# Patient Record
Sex: Female | Born: 1980 | Race: Black or African American | Hispanic: No | Marital: Married | Smoking: Never smoker
Health system: Southern US, Community
[De-identification: ages and names within clinical notes are randomized; demographics above are authoritative.]

## PROBLEM LIST (undated history)

## (undated) DIAGNOSIS — K219 Gastro-esophageal reflux disease without esophagitis: Secondary | ICD-10-CM

## (undated) DIAGNOSIS — D649 Anemia, unspecified: Secondary | ICD-10-CM

## (undated) DIAGNOSIS — T7840XA Allergy, unspecified, initial encounter: Secondary | ICD-10-CM

## (undated) DIAGNOSIS — K589 Irritable bowel syndrome without diarrhea: Secondary | ICD-10-CM

## (undated) DIAGNOSIS — F32A Depression, unspecified: Secondary | ICD-10-CM

## (undated) DIAGNOSIS — F329 Major depressive disorder, single episode, unspecified: Secondary | ICD-10-CM

## (undated) DIAGNOSIS — N809 Endometriosis, unspecified: Secondary | ICD-10-CM

## (undated) DIAGNOSIS — F988 Other specified behavioral and emotional disorders with onset usually occurring in childhood and adolescence: Secondary | ICD-10-CM

## (undated) HISTORY — DX: Endometriosis, unspecified: N80.9

## (undated) HISTORY — PX: CHOLECYSTECTOMY: SHX55

## (undated) HISTORY — DX: Major depressive disorder, single episode, unspecified: F32.9

## (undated) HISTORY — DX: Gastro-esophageal reflux disease without esophagitis: K21.9

## (undated) HISTORY — DX: Anemia, unspecified: D64.9

## (undated) HISTORY — PX: TUBAL LIGATION: SHX77

## (undated) HISTORY — DX: Depression, unspecified: F32.A

## (undated) HISTORY — PX: OTHER SURGICAL HISTORY: SHX169

## (undated) HISTORY — DX: Other specified behavioral and emotional disorders with onset usually occurring in childhood and adolescence: F98.8

## (undated) HISTORY — DX: Allergy, unspecified, initial encounter: T78.40XA

## (undated) HISTORY — DX: Irritable bowel syndrome, unspecified: K58.9

---
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
## 2002-02-23 HISTORY — PX: CHOLECYSTECTOMY: SHX55

## 2006-02-23 HISTORY — PX: TUBAL LIGATION: SHX77

## 2009-05-23 ENCOUNTER — Inpatient Hospital Stay (HOSPITAL_COMMUNITY): Admission: AD | Admit: 2009-05-23 | Discharge: 2009-05-23 | Payer: Self-pay | Admitting: Obstetrics & Gynecology

## 2011-09-08 ENCOUNTER — Other Ambulatory Visit: Payer: Self-pay | Admitting: Obstetrics

## 2011-09-08 DIAGNOSIS — N6320 Unspecified lump in the left breast, unspecified quadrant: Secondary | ICD-10-CM

## 2011-09-08 DIAGNOSIS — N644 Mastodynia: Secondary | ICD-10-CM

## 2011-09-14 ENCOUNTER — Ambulatory Visit
Admission: RE | Admit: 2011-09-14 | Discharge: 2011-09-14 | Disposition: A | Discharge status: Home / Self Care | Payer: BC Managed Care – PPO | Source: Ambulatory Visit | Attending: Obstetrics | Admitting: Obstetrics

## 2011-09-14 DIAGNOSIS — N644 Mastodynia: Secondary | ICD-10-CM

## 2011-09-14 DIAGNOSIS — N6320 Unspecified lump in the left breast, unspecified quadrant: Secondary | ICD-10-CM

## 2012-06-05 ENCOUNTER — Ambulatory Visit (INDEPENDENT_AMBULATORY_CARE_PROVIDER_SITE_OTHER): Payer: BC Managed Care – PPO | Admitting: Family Medicine

## 2012-06-05 VITALS — BP 125/87 | HR 105 | Temp 99.0°F | Resp 16 | Ht 59.0 in | Wt 186.0 lb

## 2012-06-05 DIAGNOSIS — J029 Acute pharyngitis, unspecified: Secondary | ICD-10-CM

## 2012-06-05 DIAGNOSIS — J039 Acute tonsillitis, unspecified: Secondary | ICD-10-CM

## 2012-06-05 DIAGNOSIS — R509 Fever, unspecified: Secondary | ICD-10-CM

## 2012-06-05 LAB — POCT CBC
Granulocyte percent: 66.5 %G (ref 37–80)
Lymph, poc: 2.1 (ref 0.6–3.4)
MCH, POC: 27.9 pg (ref 27–31.2)
MCHC: 31.3 g/dL — AB (ref 31.8–35.4)
MID (cbc): 0.9 (ref 0–0.9)
POC Granulocyte: 5.9 (ref 2–6.9)
POC LYMPH PERCENT: 23.3 %L (ref 10–50)
POC MID %: 10.2 %M (ref 0–12)
RDW, POC: 13.2 %

## 2012-06-05 MED ORDER — IBUPROFEN 800 MG PO TABS: 800.0000 mg | ORAL_TABLET | Freq: Three times a day (TID) | ORAL | Status: DC | PRN

## 2012-06-05 MED ORDER — CEFDINIR 300 MG PO CAPS: 600.0000 mg | ORAL_CAPSULE | Freq: Every day | ORAL | Status: DC

## 2012-06-05 NOTE — Progress Notes (Signed)
[address]   [place], [place]  [address]   [phone]  Subjective:    Patient ID: Aimee King, female    DOB: 1980-08-01, 32 y.o.   MRN: [id number]  HPI This 32 y.o. female presents for evaluation of sore throat, laryngitis. Onset five days ago.  Has been taking Dayquil, Nyquil, Ibuprofen 600mg  daily.  No fever; +chills/sweats.  +HA mild.  +ST severe; talking hurts; swallowing hurts; R sided pain mostly.  Pain shooting into ear.  No rhinorrhea; no nasal congestion; +PND; no cough.  +nausea; no vomiting/diarrhea. Decreased appetite.  No rash.  Teaches school.    PCP:  [facility].   Review of Systems  Constitutional: Positive for fever, chills and fatigue.  HENT: Positive for ear pain, sore throat, trouble swallowing, voice change and postnasal drip. Negative for congestion, rhinorrhea, drooling and sinus pressure.   Respiratory: Negative for cough, shortness of breath, wheezing and stridor.   Gastrointestinal: Negative for nausea, vomiting and diarrhea.  Skin: Negative for rash.  Neurological: Positive for headaches. Negative for dizziness and light-headedness.        Past Medical History  Diagnosis Date  . Allergy   . Depression   . Anemia   . Attention deficit disorder (ADD)     Past Surgical History  Procedure Laterality Date  . Cholecystectomy    . Cesarean section    . Tubal ligation      Prior to Admission medications   Medication Sig Start Date End Date Taking? Authorizing Provider  amphetamine-dextroamphetamine (ADDERALL) 10 MG tablet Take 10 mg by mouth 2 (two) times daily.   Yes Historical Provider, MD    Allergies  Allergen Reactions  . Codeine Nausea And Vomiting  . Latex Itching    History   Social History  . Marital Status: Married    Spouse Name: N/A    Number of Children: N/A  . Years of Education: N/A   Occupational History  . Not on file.   Social History Main Topics  . Smoking status: Never Smoker   .  Smokeless tobacco: Not on file  . Alcohol Use: Yes  . Drug Use: No  . Sexually Active: Not on file   Other Topics Concern  . Not on file   Social History Narrative  . No narrative on file    Family History  Problem Relation Age of Onset  . Diabetes Mother   . Heart disease Father   . Diabetes Father   . Diabetes Maternal Grandmother     Objective:   Physical Exam  Nursing note and vitals reviewed. Constitutional: She is oriented to person, place, and time. She appears well-developed and well-nourished. No distress.  HENT:  Head: Normocephalic and atraumatic.  Right Ear: External ear normal.  Left Ear: External ear normal.  Mouth/Throat: Mucous membranes are normal. Oropharyngeal exudate, posterior oropharyngeal edema and posterior oropharyngeal erythema present. No tonsillar abscesses.  Eyes: Conjunctivae are normal. Pupils are equal, round, and reactive to light.  Neck: Normal range of motion. Neck supple. No thyromegaly present.  R anterior cervical LAD; +TTP.  Cardiovascular: Normal rate, regular rhythm and normal heart sounds.  Exam reveals no gallop and no friction rub.   No murmur heard. Pulmonary/Chest: Effort normal and breath sounds normal. No respiratory distress. She has no wheezes. She has no rales.  Lymphadenopathy:    She has cervical adenopathy.  Neurological: She is alert and oriented to person, place, and time.  Skin: No rash noted.  She is not diaphoretic.  Psychiatric: She has a normal mood and affect. Her behavior is normal.    Results for orders placed in visit on 06/05/12  POCT RAPID STREP A (OFFICE)      Result Value Range   Rapid Strep A Screen Negative  Negative  POCT CBC      Result Value Range   WBC 8.9  4.6 - 10.2 K/uL   Lymph, poc 2.1  0.6 - 3.4   POC LYMPH PERCENT 23.3  10 - 50 %L   MID (cbc) 0.9  0 - 0.9   POC MID % 10.2  0 - 12 %M   POC Granulocyte 5.9  2 - 6.9   Granulocyte percent 66.5  37 - 80 %G   RBC 4.73  4.04 - 5.48 M/uL    Hemoglobin 13.2  12.2 - 16.2 g/dL   HCT, POC 16.1  09.6 - 47.9 %   MCV 89.3  80 - 97 fL   MCH, POC 27.9  27 - 31.2 pg   MCHC 31.3 (*) 31.8 - 35.4 g/dL   RDW, POC 04.5     Platelet Count, POC 248  142 - 424 K/uL   MPV 9.2  0 - 99.8 fL         Assessment & Plan:  Sore throat - Plan: POCT rapid strep A, POCT CBC, Epstein-Barr virus VCA antibody panel, Culture, Group A Strep  Fever - Plan: POCT rapid strep A, POCT CBC, Epstein-Barr virus VCA antibody panel, Culture, Group A Strep  Tonsillitis - Plan: cefdinir (OMNICEF) 300 MG capsule    1. Tonsillitis:  New.  Concerning for early tonsillar abscess; thus, will empirically treat with Omnicef.  RTC inability to swallow, drooling, spitting.  Rx for Advil 800mg  tid PRN.  Obtain EBV titers yet history of previous acute mono.    Meds ordered this encounter  Medications  . amphetamine-dextroamphetamine (ADDERALL) 10 MG tablet    Sig: Take 10 mg by mouth 2 (two) times daily.  . cefdinir (OMNICEF) 300 MG capsule    Sig: Take 2 capsules (600 mg total) by mouth daily.    Dispense:  20 capsule    Refill:  0  . ibuprofen (ADVIL,MOTRIN) 800 MG tablet    Sig: Take 1 tablet (800 mg total) by mouth every 8 (eight) hours as needed for pain.    Dispense:  45 tablet    Refill:  0

## 2012-06-05 NOTE — Patient Instructions (Addendum)
Sore throat - Plan: POCT rapid strep A, POCT CBC, Epstein-Barr virus VCA antibody panel, Culture, Group A Strep  Fever - Plan: POCT rapid strep A, POCT CBC, Epstein-Barr virus VCA antibody panel, Culture, Group A Strep  Tonsillitis - Plan: cefdinir (OMNICEF) 300 MG capsule

## 2012-06-06 LAB — EPSTEIN-BARR VIRUS VCA ANTIBODY PANEL
EBV EA IgG: 7.1 U/mL (ref <9.0)
EBV NA IgG: 408 U/mL — ABNORMAL HIGH (ref <18.0)
EBV VCA IgG: 102 U/mL — ABNORMAL HIGH (ref <18.0)
EBV VCA IgM: <10 U/mL (ref <36.0)

## 2012-06-07 LAB — CULTURE, GROUP A STREP

## 2012-06-09 ENCOUNTER — Telehealth: Payer: Self-pay | Admitting: Radiology

## 2012-06-09 NOTE — Telephone Encounter (Signed)
Spoke to patient she is better, she is advised to complete all her antibiotics, follow up if not well.

## 2012-06-09 NOTE — Telephone Encounter (Signed)
Error, patient name changed, she remembered new name when I spoke to her.

## 2012-06-21 ENCOUNTER — Ambulatory Visit (INDEPENDENT_AMBULATORY_CARE_PROVIDER_SITE_OTHER): Payer: BC Managed Care – PPO | Admitting: Family Medicine

## 2012-06-21 VITALS — BP 106/73 | HR 114 | Temp 98.7°F | Resp 16 | Ht 59.75 in | Wt 189.0 lb

## 2012-06-21 DIAGNOSIS — R5383 Other fatigue: Secondary | ICD-10-CM

## 2012-06-21 DIAGNOSIS — J039 Acute tonsillitis, unspecified: Secondary | ICD-10-CM

## 2012-06-21 DIAGNOSIS — R1031 Right lower quadrant pain: Secondary | ICD-10-CM

## 2012-06-21 DIAGNOSIS — R5381 Other malaise: Secondary | ICD-10-CM

## 2012-06-21 LAB — POCT UA - MICROSCOPIC ONLY
Casts, Ur, LPF, POC: NEGATIVE
Crystals, Ur, HPF, POC: NEGATIVE
RBC, urine, microscopic: NEGATIVE
Yeast, UA: NEGATIVE

## 2012-06-21 LAB — POCT URINALYSIS DIPSTICK
Bilirubin, UA: NEGATIVE
Blood, UA: NEGATIVE
Glucose, UA: NEGATIVE
Ketones, UA: NEGATIVE
Leukocytes, UA: NEGATIVE
Nitrite, UA: NEGATIVE
Protein, UA: NEGATIVE
Spec Grav, UA: 1.025
Urobilinogen, UA: 1
pH, UA: 7

## 2012-06-21 LAB — POCT CBC
Granulocyte percent: 73.4 % (ref 37–80)
HCT, POC: 44.1 % (ref 37.7–47.9)
Hemoglobin: 13.6 g/dL (ref 12.2–16.2)
Lymph, poc: 2.3 (ref 0.6–3.4)
MCH, POC: 27.9 pg (ref 27–31.2)
MCHC: 30.8 g/dL — AB (ref 31.8–35.4)
MCV: 90.3 fL (ref 80–97)
MID (cbc): 0.7 (ref 0–0.9)
MPV: 10.1 fL (ref 0–99.8)
POC Granulocyte: 8.5 — AB (ref 2–6.9)
POC LYMPH PERCENT: 20.2 % (ref 10–50)
POC MID %: 6.4 % (ref 0–12)
Platelet Count, POC: 274 K/uL (ref 142–424)
RBC: 4.88 M/uL (ref 4.04–5.48)
RDW, POC: 13.7 %
WBC: 11.6 K/uL — AB (ref 4.6–10.2)

## 2012-06-21 MED ORDER — METHYLPREDNISOLONE 4 MG PO KIT: PACK | ORAL | Status: DC

## 2012-06-21 MED ORDER — AMOXICILLIN-POT CLAVULANATE 875-125 MG PO TABS: 1.0000 | ORAL_TABLET | Freq: Two times a day (BID) | ORAL | Status: DC

## 2012-06-21 NOTE — Progress Notes (Signed)
[facility]:  Office Visit  Chief Complaint:  Chief Complaint  Patient presents with  . Follow-up    HPI: Aimee King is a 32 y.o. female who complains of: 1. Fatigue x several months. She is a Runner, broadcasting/film/video. She feels the overwhelming fatigue is slowing her down. She denies any family h/o thyroid disease. No dry skin, memory loss, depression, + weight gain 2. Throat pain, fatigue, chills, cough x 3 weeks. No better since on Omnicef. Rapid strep, Group B strep, EBV IGM were all negative from last visit 06/05/2012 3. RLQ abd pain x 7 days, sharp with movement, no nausea/vomiting, + chills. No fevers. NO urinary sxs, vaginal dc, Not associated with food. + abd surgery. + h.o endometriosis and is on Mirena IUD for that even though she has had BTL. Deneis ovarian cyst, fibroids, constipation, STDs  Past Medical History  Diagnosis Date  . Allergy   . Depression   . Anemia   . Attention deficit disorder (ADD)   . Endometriosis    Past Surgical History  Procedure Laterality Date  . Cholecystectomy    . Cesarean section    . Tubal ligation     History   Social History  . Marital Status: Married    Spouse Name: N/A    Number of Children: N/A  . Years of Education: N/A   Social History Main Topics  . Smoking status: Never Smoker   . Smokeless tobacco: None  . Alcohol Use: Yes  . Drug Use: No  . Sexually Active: None   Other Topics Concern  . None   Social History Narrative  . None   Family History  Problem Relation Age of Onset  . Diabetes Mother   . Heart disease Father   . Diabetes Father   . Diabetes Maternal Grandmother    Allergies  Allergen Reactions  . Codeine Nausea And Vomiting  . Latex Itching   Prior to Admission medications   Medication Sig Start Date End Date Taking? Authorizing Provider  amphetamine-dextroamphetamine (ADDERALL) 10 MG tablet Take 10 mg by mouth 2 (two) times daily.   Yes Historical Provider, MD  ibuprofen  (ADVIL,MOTRIN) 800 MG tablet Take 1 tablet (800 mg total) by mouth every 8 (eight) hours as needed for pain. 06/05/12  Yes Ethelda Chick, MD     ROS: The patient denies fevers, night sweats, unintentional weight loss, chest pain, palpitations, wheezing, dyspnea on exertion, nausea, vomiting, dysuria, hematuria, melena, numbness, weakness, or tingling.   All other systems have been reviewed and were otherwise negative with the exception of those mentioned in the HPI and as above.    PHYSICAL EXAM: Filed Vitals:   06/21/12 1844  BP: 106/73  Pulse: 114  Temp: 98.7 F (37.1 C)  Resp: 16   Filed Vitals:   06/21/12 1844  Height: 4' 11.75" (1.518 m)  Weight: 189 lb (85.73 kg)   Body mass index is 37.2 kg/(m^2).  General: Alert, no acute distress, obese AA female HEENT:  Normocephalic, atraumatic, oropharynx patent. + erythematous onsil rihgt greater than left, no exudates. + sinus tenderenss, TM nl.  Cardiovascular:  Regular rate and rhythm, no rubs murmurs or gallops.  No Carotid bruits, radial pulse intact. No pedal edema.  Respiratory: Clear to auscultation bilaterally.  No wheezes, rales, or rhonchi.  No cyanosis, no use of accessory musculature GI: No organomegaly, abdomen is soft and minimally-tender RLQ, positive bowel sounds.  No masses. No guarding/rebound Skin: No rashes. Neurologic: Facial  musculature symmetric. Psychiatric: Patient is appropriate throughout our interaction. Lymphatic: No cervical lymphadenopathy Musculoskeletal: Gait intact.   LABS: Results for orders placed in visit on 06/21/12  POCT CBC      Result Value Range   WBC 11.6 (*) 4.6 - 10.2 K/uL   Lymph, poc 2.3  0.6 - 3.4   POC LYMPH PERCENT 20.2  10 - 50 %L   MID (cbc) 0.7  0 - 0.9   POC MID % 6.4  0 - 12 %M   POC Granulocyte 8.5 (*) 2 - 6.9   Granulocyte percent 73.4  37 - 80 %G   RBC 4.88  4.04 - 5.48 M/uL   Hemoglobin 13.6  12.2 - 16.2 g/dL   HCT, POC 47.8  29.5 - 47.9 %   MCV 90.3  80 - 97 fL    MCH, POC 27.9  27 - 31.2 pg   MCHC 30.8 (*) 31.8 - 35.4 g/dL   RDW, POC 62.1     Platelet Count, POC 274  142 - 424 K/uL   MPV 10.1  0 - 99.8 fL  POCT URINALYSIS DIPSTICK      Result Value Range   Color, UA yellow     Clarity, UA cloudy     Glucose, UA neg     Bilirubin, UA neg     Ketones, UA neg     Spec Grav, UA 1.025     Blood, UA neg     pH, UA 7.0     Protein, UA neg     Urobilinogen, UA 1.0     Nitrite, UA neg     Leukocytes, UA Negative    POCT UA - MICROSCOPIC ONLY      Result Value Range   WBC, Ur, HPF, POC 0-2     RBC, urine, microscopic neg     Bacteria, U Microscopic trace     Mucus, UA trace     Epithelial cells, urine per micros 4-16     Crystals, Ur, HPF, POC neg     Casts, Ur, LPF, POC neg     Yeast, UA neg       EKG/XRAY:   Primary read interpreted by Dr. Conley Rolls at [facility].   ASSESSMENT/PLAN: Encounter Diagnoses  Name Primary?  . Other malaise and fatigue Yes  . Acute tonsillitis   . RLQ abdominal pain    I advise patient that her leukocytois may be from her URI and tonsil issues and not from her appendix However she needs to monitor for worsening abd pain sxs, if worsen then need to go to Er or call us so we can get stat CT abd/pelvis Will treat for acute pahyrngitis/tonsillitis with Augmentinnow that we know EBV  IgM is negative Rx amoxacillin Rx prednisone CMP , TSH pending F/u prn     Keelynn Furgerson PHUONG, DO 06/21/2012 8:54 PM

## 2012-06-22 LAB — COMPREHENSIVE METABOLIC PANEL
ALT: 15 U/L (ref 0–35)
BUN: 7 mg/dL (ref 6–23)
Chloride: 101 mEq/L (ref 96–112)
Potassium: 3.9 mEq/L (ref 3.5–5.3)
Total Bilirubin: 0.4 mg/dL (ref 0.3–1.2)

## 2012-06-22 LAB — COMPREHENSIVE METABOLIC PANEL WITH GFR
AST: 13 U/L (ref 0–37)
Albumin: 4.3 g/dL (ref 3.5–5.2)
Alkaline Phosphatase: 64 U/L (ref 39–117)
CO2: 29 meq/L (ref 19–32)
Calcium: 9.3 mg/dL (ref 8.4–10.5)
Creat: 0.76 mg/dL (ref 0.50–1.10)
Glucose, Bld: 74 mg/dL (ref 70–99)
Sodium: 138 meq/L (ref 135–145)
Total Protein: 7.8 g/dL (ref 6.0–8.3)

## 2012-06-22 LAB — TSH: TSH: 1.224 u[IU]/mL (ref 0.350–4.500)

## 2012-10-03 ENCOUNTER — Ambulatory Visit: Payer: BC Managed Care – PPO | Admitting: Obstetrics & Gynecology

## 2012-10-04 ENCOUNTER — Encounter: Payer: Self-pay | Admitting: Advanced Practice Midwife

## 2012-10-04 ENCOUNTER — Ambulatory Visit (INDEPENDENT_AMBULATORY_CARE_PROVIDER_SITE_OTHER): Payer: BC Managed Care – PPO | Admitting: Advanced Practice Midwife

## 2012-10-04 VITALS — BP 110/76 | HR 82 | Temp 98.7°F | Ht 59.75 in | Wt 190.0 lb

## 2012-10-04 DIAGNOSIS — B373 Candidiasis of vulva and vagina: Secondary | ICD-10-CM | POA: Insufficient documentation

## 2012-10-04 DIAGNOSIS — M79672 Pain in left foot: Secondary | ICD-10-CM

## 2012-10-04 DIAGNOSIS — K3189 Other diseases of stomach and duodenum: Secondary | ICD-10-CM

## 2012-10-04 DIAGNOSIS — M79609 Pain in unspecified limb: Secondary | ICD-10-CM

## 2012-10-04 DIAGNOSIS — Z01419 Encounter for gynecological examination (general) (routine) without abnormal findings: Secondary | ICD-10-CM

## 2012-10-04 DIAGNOSIS — R1013 Epigastric pain: Secondary | ICD-10-CM

## 2012-10-04 DIAGNOSIS — R109 Unspecified abdominal pain: Secondary | ICD-10-CM

## 2012-10-04 DIAGNOSIS — N809 Endometriosis, unspecified: Secondary | ICD-10-CM

## 2012-10-04 DIAGNOSIS — M549 Dorsalgia, unspecified: Secondary | ICD-10-CM

## 2012-10-04 DIAGNOSIS — Z0000 Encounter for general adult medical examination without abnormal findings: Secondary | ICD-10-CM

## 2012-10-04 MED ORDER — FLUCONAZOLE 150 MG PO TABS: 150.0000 mg | ORAL_TABLET | Freq: Once | ORAL | Status: DC

## 2012-10-04 NOTE — Progress Notes (Signed)
Subjective:     Aimee King is a 32 y.o. female here for a routine exam.  Current complaints: an annual exam. Pt states she is having random numbness in her right leg.  Personal health questionnaire reviewed: yes.   Gynecologic History No LMP recorded. Patient is not currently having periods (Reason: IUD). Contraception: IUD Last Pap: 2011. Results were: normal Last mammogram: 2013. Results were: normal  Obstetric History OB History   Grav Para Term Preterm Abortions TAB SAB Ect Mult Living                   The following portions of the patient's history were reviewed and updated as appropriate: allergies, current medications, past family history, past medical history, past social history, past surgical history and problem list.   Review of Systems A comprehensive review of systems was negative except for: Gastrointestinal: positive for abdominal pain, change in bowel habits, nausea and patient reports stomach discomfort and pain 30 minutes following her meals, breakfast being the worse. Reports it doesn't seem to matter what she eats. Following eating she often needs to go to the bathroom. She has tried antacids, denies constipation, denies reflux or heartburn. States her stomach is churning and uncomfortable.    Patient reports excessive weight gain due to hormone therapy in the past for her endometriosis. This is currently controlled with her IUD. However she is unable to lose the weight despite her efforts. She has significant foot pain that makes it difficult for her to walk long distances and to exercise. She states within 15 minutes of walking her feet are cramping up with significant pain.       Objective:    BP 110/76  Pulse 82  Temp(Src) 98.7 F (37.1 C)  Ht 4' 11.75" (1.518 m)  Wt 190 lb (86.183 kg)  BMI 37.4 kg/m2  General Appearance:    Alert, cooperative, no distress, appears stated age  Head:    Normocephalic, without obvious abnormality, atraumatic   Eyes:    PERRL, conjunctiva/corneas clear, EOM's intact, fundi    benign, both eyes  Ears:    Normal TM's and external ear canals, both ears  Nose:   Nares normal, septum midline, mucosa normal, no drainage    or sinus tenderness  Throat:   Lips, mucosa, and tongue normal; teeth and gums normal  Neck:   Supple, symmetrical, trachea midline, no adenopathy;    thyroid:  no enlargement/tenderness/nodules; no carotid   bruit or JVD  Back:     Symmetric, no curvature, ROM normal, no CVA tenderness  Lungs:     Clear to auscultation bilaterally, respirations unlabored  Chest Wall:    No tenderness or deformity   Heart:    Regular rate and rhythm, S1 and S2 normal, no murmur, rub   or gallop  Breast Exam:    No tenderness, masses, or nipple abnormality  Abdomen:     Soft, non-tender, bowel sounds active all four quadrants,    no masses, no organomegaly  Genitalia:    Normal female without lesion, discharge or tenderness  Rectal:    Normal tone, normal prostate, no masses or tenderness;   guaiac negative stool  Extremities:   Extremities normal, atraumatic, no cyanosis or edema  Pulses:   2+ and symmetric all extremities  Skin:   Skin color, texture, turgor normal, no rashes or lesions  Lymph nodes:   Cervical, supraclavicular, and axillary nodes normal  Neurologic:   CNII-XII intact, normal strength, sensation and  reflexes    throughout       Assessment:   Endometriosis symptoms controlled with IUD  Bilateral foot pain, affecting ambulation and exercise Back pain GI discomfort following eating, cannot rule out IBS Overweight Family history of DM  Plan:    Education reviewed: calcium supplements, depression evaluation, low fat, low cholesterol diet, safe sex/STD prevention, self breast exams and weight bearing exercise. Contraception: IUD and tubal ligation. Hormone replacement therapy: not applicable. Follow up in: PRN .Marland Kitchen   We discussed in detail her plan for weight loss and  prevention of DM. Referral for GI and Podiatry.  Annual labs today for health prevention and screening.   50 min spent with patient greater than 80% spent in counseling and coordination of care.   Anab Vivar Wilson Singer CNM

## 2012-10-05 ENCOUNTER — Other Ambulatory Visit: Payer: BC Managed Care – PPO

## 2012-10-05 DIAGNOSIS — Z0000 Encounter for general adult medical examination without abnormal findings: Secondary | ICD-10-CM

## 2012-10-05 LAB — COMPREHENSIVE METABOLIC PANEL
Albumin: 3.9 g/dL (ref 3.5–5.2)
CO2: 28 mEq/L (ref 19–32)
Calcium: 9.1 mg/dL (ref 8.4–10.5)
Chloride: 103 mEq/L (ref 96–112)
Total Bilirubin: 0.4 mg/dL (ref 0.3–1.2)
Total Protein: 7.3 g/dL (ref 6.0–8.3)

## 2012-10-05 LAB — LIPID PANEL
Cholesterol: 144 mg/dL (ref 0–200)
HDL: 43 mg/dL (ref >39)
LDL Cholesterol: 85 mg/dL (ref 0–99)
Total CHOL/HDL Ratio: 3.3 Ratio
Triglycerides: 78 mg/dL (ref <150)

## 2012-10-05 LAB — PAP IG AND HPV HIGH-RISK

## 2012-10-05 LAB — CBC WITH DIFFERENTIAL/PLATELET
Basophils Absolute: 0 10*3/uL (ref 0.0–0.1)
Basophils Relative: 1 % (ref 0–1)
Eosinophils Relative: 1 % (ref 0–5)
HCT: 42.2 % (ref 36.0–46.0)
Lymphs Abs: 2.4 10*3/uL (ref 0.7–4.0)
MCHC: 33.2 g/dL (ref 30.0–36.0)
MCV: 86.8 fL (ref 78.0–100.0)
RBC: 4.86 MIL/uL (ref 3.87–5.11)
RDW: 13.9 % (ref 11.5–15.5)

## 2012-10-06 LAB — TSH: TSH: 1.627 u[IU]/mL (ref 0.350–4.500)

## 2012-10-07 DIAGNOSIS — N809 Endometriosis, unspecified: Secondary | ICD-10-CM | POA: Insufficient documentation

## 2012-10-07 DIAGNOSIS — M79671 Pain in right foot: Secondary | ICD-10-CM | POA: Insufficient documentation

## 2012-10-07 DIAGNOSIS — R109 Unspecified abdominal pain: Secondary | ICD-10-CM | POA: Insufficient documentation

## 2012-10-07 DIAGNOSIS — M549 Dorsalgia, unspecified: Secondary | ICD-10-CM | POA: Insufficient documentation

## 2012-10-13 ENCOUNTER — Encounter: Payer: Self-pay | Admitting: Advanced Practice Midwife

## 2012-10-19 ENCOUNTER — Encounter: Payer: Self-pay | Admitting: Gastroenterology

## 2012-10-26 ENCOUNTER — Ambulatory Visit: Payer: BC Managed Care – PPO | Admitting: Gastroenterology

## 2012-12-16 ENCOUNTER — Encounter (HOSPITAL_COMMUNITY): Payer: Self-pay | Admitting: Emergency Medicine

## 2012-12-16 ENCOUNTER — Emergency Department (HOSPITAL_COMMUNITY)
Admission: EM | Admit: 2012-12-16 | Discharge: 2012-12-16 | Disposition: A | Discharge status: Home / Self Care | Payer: BC Managed Care – PPO | Source: Home / Self Care | Attending: Family Medicine | Admitting: Family Medicine

## 2012-12-16 DIAGNOSIS — J029 Acute pharyngitis, unspecified: Secondary | ICD-10-CM

## 2012-12-16 HISTORY — DX: Other specified behavioral and emotional disorders with onset usually occurring in childhood and adolescence: F98.8

## 2012-12-16 MED ORDER — AMOXICILLIN-POT CLAVULANATE 875-125 MG PO TABS: 1.0000 | ORAL_TABLET | Freq: Two times a day (BID) | ORAL | Status: DC

## 2012-12-16 MED ORDER — TRAMADOL HCL 50 MG PO TABS: 50.0000 mg | ORAL_TABLET | Freq: Three times a day (TID) | ORAL | Status: DC | PRN

## 2012-12-16 NOTE — ED Provider Notes (Signed)
CSN: [id number]     Arrival date & time 12/16/12  1727 History   First MD Initiated Contact with Patient 12/16/12 1756     Chief Complaint  Patient presents with  . Sore Throat   (Consider location/radiation/quality/duration/timing/severity/associated sxs/prior Treatment) Patient is a 32 y.o. female presenting with pharyngitis. The history is provided by the patient.  Sore Throat This is a new problem. The current episode started more than 1 week ago. The problem occurs constantly. The problem has not changed since onset.Pertinent negatives include no chest pain, no abdominal pain, no headaches and no shortness of breath. Associated symptoms comments: Ear pain. The symptoms are aggravated by swallowing and eating. Nothing relieves the symptoms. She has tried acetaminophen and water for the symptoms. The treatment provided no relief.   She states she had similar sore throat in the spring of this year that lasted >1 month treated with antibiotics. Neg for strep at that time. Has history of mono. No recent antibiotics. 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   Past Medical History  Diagnosis Date  . ADD (attention deficit disorder)    Past Surgical History  Procedure Laterality Date  . Cesarean section  2004, 2008  . Cholecystectomy  2004  . Laparoscopy  2002    endometriosis  . Tubal ligation  2008   Family History  Problem Relation Age of Onset  . Hypertension Mother   . Diabetes Mother   . Hypertension Father    History  Substance Use Topics  . Smoking status: Never Smoker   . Smokeless tobacco: Not on file  . Alcohol Use: Yes     Comment: occasional   OB History   Grav Para Term Preterm Abortions TAB SAB Ect Mult Living                 Review of Systems  Constitutional: Negative for fever and chills.  HENT: Positive for congestion, ear pain, rhinorrhea, sore throat and trouble swallowing. Negative for mouth sores and sinus pressure.   Eyes: Negative  for visual disturbance.  Respiratory: Positive for cough. Negative for shortness of breath.   Cardiovascular: Negative for chest pain and leg swelling.  Gastrointestinal: Negative for abdominal pain.  Genitourinary: Negative for dysuria.  Musculoskeletal: Negative for arthralgias and myalgias.  Skin: Negative for rash.  Neurological: Negative for headaches.    Allergies  Codeine and Latex  Home Medications   Current Outpatient Rx  Name  Route  Sig  Dispense  Refill  . amphetamine-dextroamphetamine (ADDERALL XR) 20 MG 24 hr capsule   Oral   Take 20 mg by mouth every morning.         Marland Kitchen levonorgestrel (MIRENA) 20 MCG/24HR IUD   Intrauterine   1 each by Intrauterine route once.         Marland Kitchen amoxicillin-clavulanate (AUGMENTIN) 875-125 MG per tablet   Oral   Take 1 tablet by mouth 2 (two) times daily.   14 tablet   0   . traMADol (ULTRAM) 50 MG tablet   Oral   Take 1 tablet (50 mg total) by mouth every 8 (eight) hours as needed for pain.   10 tablet   0    BP 119/74  Pulse 83  Temp(Src) 98.1 F (36.7 C) (Oral)  Resp 16  SpO2 98% Physical Exam  Constitutional: She is oriented to person, place, and time. She appears well-developed and well-nourished. No distress.  HENT:  Head: Normocephalic and atraumatic.  Right  Ear: Tympanic membrane normal.  Left Ear: Tympanic membrane normal.  Nose: Nose normal.  Mouth/Throat: Mucous membranes are normal. Posterior oropharyngeal erythema present. No oropharyngeal exudate, posterior oropharyngeal edema or tonsillar abscesses.  Eyes: Pupils are equal, round, and reactive to light.  Neck: Normal range of motion. Neck supple.  Cardiovascular: Normal rate, regular rhythm and normal heart sounds.   No murmur heard. Pulmonary/Chest: Effort normal and breath sounds normal. She has no wheezes.  Abdominal: Soft. She exhibits no distension. There is no tenderness.  Musculoskeletal: Normal range of motion. She exhibits no edema and no  tenderness.  Neurological: She is alert and oriented to person, place, and time.  Skin: Skin is warm and dry.  Psychiatric: She has a normal mood and affect.    ED Course  Procedures (including critical care time) Labs Review Labs Reviewed  POCT RAPID STREP A ([facility] ONLY)   Imaging Review No results found.  MDM   1. Pharyngitis    Strep negative, patient persistent that antibiotics help. I think allergies may be playing a role in her presentation - Augmentin PO x7 days. Advised patient that longer antibiotic use would not be beneficial in the long run - Should establish PCP if she desires ENT referral - Con't allergy medication. - Use Heat to the left side of neck and ear for comfort - F/u here if she worsens or fails to improve    Hilarie Fredrickson, MD 12/16/12 1923

## 2012-12-16 NOTE — ED Notes (Signed)
C/o sore throat onset 1 week ago. States it started out minor and got progressively worse.  States she has shooting pain into her L ear.  Had runny nose and cough last week.  Cough was prod of greenish yellow sputum last week but is dry now.  No fever.  C/o sinus pressure last week but it is gone now.

## 2012-12-18 LAB — CULTURE, GROUP A STREP

## 2012-12-18 NOTE — ED Provider Notes (Signed)
Medical screening examination/treatment/procedure(s) were performed by a resident physician or non-physician practitioner and as the supervising physician I was immediately available for consultation/collaboration.  Anieya Helman, MD  Shanya Ferriss S Tynleigh Birt, MD 12/18/12 0840 

## 2012-12-27 ENCOUNTER — Ambulatory Visit (INDEPENDENT_AMBULATORY_CARE_PROVIDER_SITE_OTHER): Payer: BC Managed Care – PPO | Admitting: Advanced Practice Midwife

## 2012-12-27 ENCOUNTER — Encounter: Payer: Self-pay | Admitting: Advanced Practice Midwife

## 2012-12-27 VITALS — BP 122/84 | HR 103 | Temp 98.5°F | Wt 176.0 lb

## 2012-12-27 DIAGNOSIS — A499 Bacterial infection, unspecified: Secondary | ICD-10-CM

## 2012-12-27 DIAGNOSIS — B379 Candidiasis, unspecified: Secondary | ICD-10-CM

## 2012-12-27 DIAGNOSIS — N760 Acute vaginitis: Secondary | ICD-10-CM

## 2012-12-27 DIAGNOSIS — Z113 Encounter for screening for infections with a predominantly sexual mode of transmission: Secondary | ICD-10-CM

## 2012-12-27 DIAGNOSIS — B9689 Other specified bacterial agents as the cause of diseases classified elsewhere: Secondary | ICD-10-CM

## 2012-12-27 MED ORDER — METRONIDAZOLE 500 MG PO TABS: 500.0000 mg | ORAL_TABLET | Freq: Two times a day (BID) | ORAL | Status: DC

## 2012-12-27 MED ORDER — FLUCONAZOLE 150 MG PO TABS: 150.0000 mg | ORAL_TABLET | Freq: Once | ORAL | Status: DC

## 2012-12-27 NOTE — Progress Notes (Signed)
Subjective:     Aimee King is a 32 y.o. female here for a routine exam.  Current complaints: STD testing. Pt states she recently found out her husband has cheated.  Personal health questionnaire reviewed: yes.   Gynecologic History No LMP recorded. Patient is not currently having periods (Reason: IUD). Contraception: IUD and tubal ligation   Obstetric History OB History  No data available     The following portions of the patient's history were reviewed and updated as appropriate: allergies, current medications, past family history, past medical history, past social history, past surgical history and problem list.  Review of Systems A comprehensive review of systems was negative.    Objective:    Pelvic: cervix normal in appearance, external genitalia normal, no adnexal masses or tenderness, no cervical motion tenderness, rectovaginal septum normal, uterus normal size, shape, and consistency and vagina normal without discharge    Microscopic wet-mount exam shows clue cells, hyphae, + whiff.   Assessment:   STI exam Bacterial Vaginosis Yeast infection   Plan:    Follow up in: PRN .Marland Kitchen   GC/CT Flagyl PO Diflucan STI screen pending.  20 min spent with patient greater than 80% spent in counseling and coordination of care.   Ethelmae Ringel Wilson Singer CNM

## 2013-01-18 ENCOUNTER — Telehealth: Payer: Self-pay | Admitting: *Deleted

## 2013-02-01 ENCOUNTER — Telehealth: Payer: Self-pay | Admitting: *Deleted

## 2013-03-24 ENCOUNTER — Ambulatory Visit (INDEPENDENT_AMBULATORY_CARE_PROVIDER_SITE_OTHER): Payer: BC Managed Care – PPO | Admitting: Emergency Medicine

## 2013-03-24 ENCOUNTER — Ambulatory Visit: Payer: BC Managed Care – PPO

## 2013-03-24 VITALS — BP 122/74 | HR 99 | Temp 98.0°F | Resp 17 | Ht 59.5 in | Wt 168.0 lb

## 2013-03-24 DIAGNOSIS — R0781 Pleurodynia: Secondary | ICD-10-CM

## 2013-03-24 DIAGNOSIS — S20219A Contusion of unspecified front wall of thorax, initial encounter: Secondary | ICD-10-CM

## 2013-03-24 DIAGNOSIS — T671XXA Heat syncope, initial encounter: Secondary | ICD-10-CM

## 2013-03-24 MED ORDER — HYDROCODONE-ACETAMINOPHEN 5-325 MG PO TABS: 1.0000 | ORAL_TABLET | ORAL | Status: DC | PRN

## 2013-03-24 NOTE — Progress Notes (Addendum)
[facility] [address] [place] [address] [phone]  Date:  03/24/2013   Name:  Aimee King   DOB:  08/15/1980   MRN:  [id number]  PCP:  No PCP Per Patient    Chief Complaint: Chest Pain   History of Present Illness:  Aimee King is a 33 y.o. very pleasant female patient who presents with the following:  Patient was cleaning the paddles on her ceiling fans last night and fell onto her left side on the floor.  She had immediate left lateral chest wall pain.  No improvement over night.  Has no cough, hemoptysis, hematuria.  No nausea or vomiting.  No stool.  Hungry.  Pain is quite sever when she moves.  No improvement with over the counter medications or other home remedies. Denies other complaint or health concern today.   There are no active problems to display for this patient.   Past Medical History  Diagnosis Date  . ADD (attention deficit disorder)     Past Surgical History  Procedure Laterality Date  . Cesarean section  2004, 2008  . Cholecystectomy  2004  . Laparoscopy  2002    endometriosis  . Tubal ligation  2008    History  Substance Use Topics  . Smoking status: Never Smoker   . Smokeless tobacco: Not on file  . Alcohol Use: Yes     Comment: occasional    Family History  Problem Relation Age of Onset  . Hypertension Mother   . Diabetes Mother   . Hypertension Father     Allergies  Allergen Reactions  . Codeine Nausea And Vomiting  . Latex     Feels like little cuts in her vagina.    Medication list has been reviewed and updated.  Current Outpatient Prescriptions on File Prior to Visit  Medication Sig Dispense Refill  . amphetamine-dextroamphetamine (ADDERALL XR) 20 MG 24 hr capsule Take 20 mg by mouth every morning.      Marland Kitchen levonorgestrel (MIRENA) 20 MCG/24HR IUD 1 each by Intrauterine route once.      Marland Kitchen amoxicillin-clavulanate (AUGMENTIN) 875-125 MG per tablet Take 1 tablet by mouth 2 (two) times  daily.  14 tablet  0  . traMADol (ULTRAM) 50 MG tablet Take 1 tablet (50 mg total) by mouth every 8 (eight) hours as needed for pain.  10 tablet  0   No current facility-administered medications on file prior to visit.    Review of Systems:  As per HPI, otherwise negative.    Physical Examination: Filed Vitals:   03/24/13 1129  BP: 122/74  Pulse: 99  Temp: 98 F (36.7 C)  Resp: 17   Filed Vitals:   03/24/13 1129  Height: 4' 11.5" (1.511 m)  Weight: 168 lb (76.204 kg)   Body mass index is 33.38 kg/(m^2). Ideal Body Weight: Weight in (lb) to have BMI = 25: 125.6  GEN: WDWN, NAD, Non-toxic, A & O x 3 HEENT: Atraumatic, Normocephalic. Neck supple. No masses, No LAD. Ears and Nose: No external deformity. CV: RRR, No M/G/R. No JVD. No thrill. No extra heart sounds. CHEST:  Tender and splints left lateral chest wall.  No ecchymosis, flail, or crepitus. PULM: CTA B, no wheezes, crackles, rhonchi. No retractions. No resp. distress. No accessory muscle use. ABD: S, NT, ND, +BS. No rebound. No HSM. EXTR: No c/c/e NEURO Normal gait.  PSYCH: Normally interactive. Conversant. Not depressed or anxious appearing.  Calm demeanor.    Assessment  and Plan: Chest wall contusion vicodin Follow up in one week reviewed red flags with patient Pillow support for cough Signed,  Ellison Carwin, MD   [facility] reading (PRIMARY) by  Dr. Ouida Sills.  No pneumo/hemo.

## 2013-03-24 NOTE — Patient Instructions (Signed)
Chest Contusion °A chest contusion is a deep bruise on your chest area. Contusions are the result of an injury that caused bleeding under the skin. A chest contusion may involve bruising of the skin, muscles, or ribs. The contusion may turn blue, purple, or yellow. Minor injuries will give you a painless contusion, but more severe contusions may stay painful and swollen for a few weeks. °CAUSES  °A contusion is usually caused by a blow, trauma, or direct force to an area of the body. °SYMPTOMS  °· Swelling and redness of the injured area. °· Discoloration of the injured area. °· Tenderness and soreness of the injured area. °· Pain. °DIAGNOSIS  °The diagnosis can be made by taking a history and performing a physical exam. An X-ray, CT scan, or MRI may be needed to determine if there were any associated injuries, such as broken bones (fractures) or internal injuries. °TREATMENT  °Often, the best treatment for a chest contusion is resting, icing, and applying cold compresses to the injured area. Deep breathing exercises may be recommended to reduce the risk of pneumonia. Over-the-counter medicines may also be recommended for pain control. °HOME CARE INSTRUCTIONS  °· Put ice on the injured area. °· Put ice in a plastic bag. °· Place a towel between your skin and the bag. °· Leave the ice on for 15-20 minutes, 03-04 times a day. °· Only take over-the-counter or prescription medicines as directed by your caregiver. Your caregiver may recommend avoiding anti-inflammatory medicines (aspirin, ibuprofen, and naproxen) for 48 hours because these medicines may increase bruising. °· Rest the injured area. °· Perform deep-breathing exercises as directed by your caregiver. °· Stop smoking if you smoke. °· Do not lift objects over 5 pounds (2.3 kg) for 3 days or longer if recommended by your caregiver. °SEEK IMMEDIATE MEDICAL CARE IF:  °· You have increased bruising or swelling. °· You have pain that is getting worse. °· You have  difficulty breathing. °· You have dizziness, weakness, or fainting. °· You have blood in your urine or stool. °· You cough up or vomit blood. °· Your swelling or pain is not relieved with medicines. °MAKE SURE YOU:  °· Understand these instructions. °· Will watch your condition. °· Will get help right away if you are not doing well or get worse. °Document Released: 11/04/2000 Document Revised: 11/04/2011 Document Reviewed: 08/03/2011 °[employer]® Patient Information ©2014 [employer]. ° °

## 2013-09-29 ENCOUNTER — Ambulatory Visit: Payer: BC Managed Care – PPO | Admitting: Family Medicine

## 2013-11-10 ENCOUNTER — Ambulatory Visit (INDEPENDENT_AMBULATORY_CARE_PROVIDER_SITE_OTHER): Payer: BC Managed Care – PPO | Admitting: Internal Medicine

## 2013-11-10 ENCOUNTER — Ambulatory Visit (INDEPENDENT_AMBULATORY_CARE_PROVIDER_SITE_OTHER): Payer: BC Managed Care – PPO

## 2013-11-10 VITALS — BP 112/64 | HR 91 | Temp 97.9°F | Resp 16 | Ht <= 58 in | Wt 173.0 lb

## 2013-11-10 DIAGNOSIS — G8929 Other chronic pain: Secondary | ICD-10-CM

## 2013-11-10 DIAGNOSIS — R1013 Epigastric pain: Secondary | ICD-10-CM

## 2013-11-10 DIAGNOSIS — R110 Nausea: Secondary | ICD-10-CM

## 2013-11-10 DIAGNOSIS — R6883 Chills (without fever): Secondary | ICD-10-CM

## 2013-11-10 LAB — POCT URINALYSIS DIPSTICK
Bilirubin, UA: NEGATIVE
GLUCOSE UA: NEGATIVE
KETONES UA: NEGATIVE
LEUKOCYTES UA: NEGATIVE
NITRITE UA: NEGATIVE
PH UA: 6
PROTEIN UA: NEGATIVE
SPEC GRAV UA: 1.02
UROBILINOGEN UA: 0.2

## 2013-11-10 LAB — POCT UA - MICROSCOPIC ONLY
BACTERIA, U MICROSCOPIC: NEGATIVE
Casts, Ur, LPF, POC: NEGATIVE
Crystals, Ur, HPF, POC: NEGATIVE
MUCUS UA: NEGATIVE
YEAST UA: NEGATIVE

## 2013-11-10 MED ORDER — ONDANSETRON HCL 4 MG PO TABS: 4.0000 mg | ORAL_TABLET | Freq: Three times a day (TID) | ORAL | Status: DC | PRN

## 2013-11-10 NOTE — Progress Notes (Signed)
Subjective:    Patient ID: Aimee King, female    DOB: 23-Nov-1980, 33 y.o.   MRN: [id number] This chart was scribed for Delta. Laney Pastor, MD by Steva Colder, ED Scribe. The patient was seen in room 2 at 6:55 PM.   Chief Complaint  Patient presents with   Nausea   Dizziness    HPI Aimee King is a 33 y.o. female with a medical hx of ho presents today complaining of nausea onset 4 days. She states that the nausea began really light and now it is progressively worse. She states that she has not ate because of it. She states that she has not had a new diet since the symptoms have came. She states that she will have moments where she will need to use the bathroom quickly. She states that she has not had a Bowel movement in a couple days. She states that she is having associated symptoms of dizziness, chills, and night sweats. She denies dysuria, vomiting, sore throat, congestion, fever, and any other associated symptoms. She denies being pregnant. She states that she has had a tubal ligation and she is on Mirena. She states that she has had the Mirena for four years and she will spot while on it. 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     Patient Active Problem List   Diagnosis Date Noted   Back pain 10/07/2012   Endometriosis 10/07/2012   Foot pain, bilateral 10/07/2012   Stomach pain 10/07/2012   Well woman exam 10/04/2012   Past Medical History  Diagnosis Date   Allergy    Depression    Anemia    Attention deficit disorder (ADD)    Endometriosis    Past Surgical History  Procedure Laterality Date   Cholecystectomy     Cesarean section     Tubal ligation     Allergies  Allergen Reactions   Codeine Nausea And Vomiting   Latex Itching   Prior to Admission medications   Medication Sig Start Date End Date Taking? Authorizing Provider  amphetamine-dextroamphetamine (ADDERALL) 10 MG tablet Take 20 mg by  mouth 2 (two) times daily.    Yes Historical Provider, MD  fexofenadine (ALLEGRA) 30 MG tablet Take 30 mg by mouth daily.    Historical Provider, MD  fluconazole (DIFLUCAN) 150 MG tablet Take 1 tablet (150 mg total) by mouth once. 12/27/12   [name], CNM  metroNIDAZOLE (FLAGYL) 500 MG tablet Take 1 tablet (500 mg total) by mouth 2 (two) times daily. 12/27/12   [name], CNM      Review of Systems  Constitutional: Negative for fever.  HENT: Negative for ear pain.   Eyes: Negative for pain.  Respiratory: Negative for cough and shortness of breath.   Cardiovascular: Negative for chest pain.  Genitourinary: Negative for frequency.  Neurological: Negative for syncope and headaches.       Objective:   Physical Exam  Nursing note and vitals reviewed. Constitutional: She is oriented to person, place, and time. She appears well-developed and well-nourished. No distress.  HENT:  Head: Normocephalic and atraumatic.  Eyes: EOM are normal.  Neck: Neck supple.  Cardiovascular: Normal rate.   Pulmonary/Chest: Effort normal and breath sounds normal. No respiratory distress.  Abdominal:  Tender in the epigastric area without rebound. No other masses noted. Mild tenderness in the RLQ. Negative flank tenderness.   Musculoskeletal: Normal range of motion.  Neurological: She is alert  and oriented to person, place, and time.  Skin: Skin is warm and dry.  Psychiatric: She has a normal mood and affect. Her behavior is normal.   [facility] reading (PRIMARY) by  Dr.[name]= NAD     BP 112/64   Pulse 91   Temp(Src) 97.9 F (36.6 C) (Oral)   Resp 16   Ht 4\' 3"  (1.295 m)   Wt 173 lb (78.472 kg)   BMI 46.79 kg/m2   SpO2 100%  Assessment & Plan:  I personally performed the services described in this documentation, which was scribed in my presence. The recorded information has been reviewed and is accurate.    Nausea alone - Plan: POCT urinalysis dipstick, POCT UA - Microscopic  Only  Abdominal pain, chronic, epigastric - Plan: POCT urinalysis dipstick, POCT UA - Microscopic Only, DG Abd 1 View  Chills - Plan: POCT urinalysis dipstick, POCT UA - Microscopic Only  This should all be secondary to constipation and she will start MiraLax and fiber along with Zofran for her nausea and recheck in 4-5 days if not well

## 2014-02-08 ENCOUNTER — Ambulatory Visit (INDEPENDENT_AMBULATORY_CARE_PROVIDER_SITE_OTHER): Payer: BC Managed Care – PPO | Admitting: Family Medicine

## 2014-02-08 VITALS — BP 118/80 | HR 96 | Temp 98.0°F | Resp 18 | Ht 59.5 in | Wt 178.8 lb

## 2014-02-08 DIAGNOSIS — R05 Cough: Secondary | ICD-10-CM

## 2014-02-08 DIAGNOSIS — J9801 Acute bronchospasm: Secondary | ICD-10-CM

## 2014-02-08 DIAGNOSIS — R059 Cough, unspecified: Secondary | ICD-10-CM

## 2014-02-08 DIAGNOSIS — J0101 Acute recurrent maxillary sinusitis: Secondary | ICD-10-CM

## 2014-02-08 MED ORDER — ALBUTEROL SULFATE (2.5 MG/3ML) 0.083% IN NEBU
2.5000 mg | INHALATION_SOLUTION | Freq: Once | RESPIRATORY_TRACT | Status: AC
  Administered 2014-02-08: 2.5 mg via RESPIRATORY_TRACT

## 2014-02-08 MED ORDER — AMOXICILLIN-POT CLAVULANATE 875-125 MG PO TABS: 1.0000 | ORAL_TABLET | Freq: Two times a day (BID) | ORAL | Status: DC

## 2014-02-08 MED ORDER — BENZONATATE 100 MG PO CAPS: 100.0000 mg | ORAL_CAPSULE | Freq: Three times a day (TID) | ORAL | Status: DC | PRN

## 2014-02-08 MED ORDER — PREDNISONE 20 MG PO TABS: ORAL_TABLET | ORAL | Status: DC

## 2014-02-08 NOTE — Patient Instructions (Signed)

## 2014-02-08 NOTE — Progress Notes (Signed)
Patient ID: Aimee King, female   DOB: 1980/07/22, 33 y.o.   MRN: [id number]    Subjective:    Patient ID: Aimee King, female    DOB: [birth year], 33 y.o.   MRN: [id number]  02/08/2014   This chart was scribed for Aimee Honour, MD by Aimee King, ED Scribe. This patient was seen in room 4 and the patient's care was started at 4:29 PM.   Cough; Nasal Congestion; Facial Pain; and Pelvic Pain   HPI  HPI Comments: Aimee King is a 33 y.o. female who presents to [facility] complaining of cold-like symptoms that began began 1 week ago and worsened today. Last week, patient began to have post-nasal drip, aches and fatigue, chest tightness, an unproductive cough, and nasal congestion, swelling around her right eye that is worse at night and in the morning, as well as sore throat and otalgia that began today. Patient also complains of chronic, usually low-grade pelvic pressure/pain that has worsened today. Coughing and moving exacerbate her pelvic pain. Deep breathing and laughing trigger her cough. Patient has tried Zyrtec D and Tussin DM for the past week with some relief. She denies diaphoresis, chills, rhinorrhea, dysuria, hematuria, urgency, frequency, vaginal discharge or irritation, vaginal bleeding, nausea, vomiting, diarhea, and new constipation. Patient denies having menstrual periods, due to Mirena. She is married. Patient is a Pharmacist, hospital. She denies DM and smoking.  S/p BTL.    Review of Systems  Constitutional: Positive for fatigue. Negative for fever, chills and diaphoresis.  HENT: Positive for congestion, ear pain, facial swelling, postnasal drip, sinus pressure, sore throat, trouble swallowing and voice change. Negative for ear discharge, rhinorrhea and sneezing.   Respiratory: Positive for cough, chest tightness and wheezing. Negative for shortness of breath and stridor.   Gastrointestinal: Negative for nausea, vomiting, abdominal pain and  diarrhea.  Genitourinary: Positive for pelvic pain. Negative for dysuria, urgency, frequency, hematuria, flank pain, vaginal bleeding, vaginal discharge, enuresis, genital sores and menstrual problem.  Skin: Negative for rash.    Past Medical History  Diagnosis Date  . ADD (attention deficit disorder)   . Allergy   . Depression   . Anemia   . Attention deficit disorder (ADD)   . Endometriosis    Past Surgical History  Procedure Laterality Date  . Cesarean section  2004, 2008  . Cholecystectomy  2004  . Laparoscopy  2002    endometriosis  . Tubal ligation  2008  . Cholecystectomy    . Cesarean section    . Tubal ligation     Allergies  Allergen Reactions  . Codeine Nausea And Vomiting  . Codeine Nausea And Vomiting  . Latex Itching  . Latex     Feels like little cuts in her vagina.   Current Outpatient Prescriptions  Medication Sig Dispense Refill  . amphetamine-dextroamphetamine (ADDERALL XR) 20 MG 24 hr capsule Take 20 mg by mouth every morning.    . fexofenadine (ALLEGRA) 30 MG tablet Take 30 mg by mouth daily.    . fluconazole (DIFLUCAN) 150 MG tablet Take 1 tablet (150 mg total) by mouth once. 1 tablet 0  . HYDROcodone-acetaminophen (NORCO) 5-325 MG per tablet Take 1-2 tablets by mouth every 4 (four) hours as needed for moderate pain. 30 tablet 0  . ondansetron (ZOFRAN) 4 MG tablet Take 1 tablet (4 mg total) by mouth every 8 (eight) hours as needed for nausea or vomiting. 20 tablet 0  . traMADol (ULTRAM) 50 MG tablet Take 1  tablet (50 mg total) by mouth every 8 (eight) hours as needed for pain. 10 tablet 0  . amoxicillin-clavulanate (AUGMENTIN) 875-125 MG per tablet Take 1 tablet by mouth 2 (two) times daily. 20 tablet 0  . amphetamine-dextroamphetamine (ADDERALL) 10 MG tablet Take 20 mg by mouth 2 (two) times daily.     . benzonatate (TESSALON) 100 MG capsule Take 1-2 capsules (100-200 mg total) by mouth 3 (three) times daily as needed for cough. 40 capsule 0  .  levonorgestrel (MIRENA) 20 MCG/24HR IUD 1 each by Intrauterine route once.    . metroNIDAZOLE (FLAGYL) 500 MG tablet Take 1 tablet (500 mg total) by mouth 2 (two) times daily. (Patient not taking: Reported on 02/08/2014) 14 tablet 0  . predniSONE (DELTASONE) 20 MG tablet Three tablets daily x 2 days then two tablets daily x 5 days then one tablet daily x 3 days 19 tablet 0   No current facility-administered medications for this visit.       Objective:    BP 118/80 mmHg  Pulse 96  Temp(Src) 98 F (36.7 C) (Oral)  Resp 18  Ht 4' 11.5" (1.511 m)  Wt 178 lb 12.8 oz (81.103 kg)  BMI 35.52 kg/m2  SpO2 99% Physical Exam  Constitutional: She is oriented to person, place, and time. She appears well-developed and well-nourished. No distress.  HENT:  Head: Atraumatic. Microcephalic.  Right Ear: External ear normal.  Left Ear: External ear normal.  Nose: Mucosal edema and rhinorrhea present. Right sinus exhibits maxillary sinus tenderness and frontal sinus tenderness. Left sinus exhibits no maxillary sinus tenderness and no frontal sinus tenderness.  Mouth/Throat: No oropharyngeal exudate.  Minimal right periorbital swelling.  Eyes: Conjunctivae and EOM are normal. Pupils are equal, round, and reactive to light.  Neck: Normal range of motion. Neck supple. No tracheal deviation present.  Right anterior cervical lymphadenopathy.    Cardiovascular: Normal rate, regular rhythm and normal heart sounds.  Exam reveals no gallop and no friction rub.   No murmur heard. Pulmonary/Chest: Effort normal and breath sounds normal. No respiratory distress. She has no wheezes. She has no rales.  Patient has hacking cough throughout exam.  Musculoskeletal: Normal range of motion.  Lymphadenopathy:    She has cervical adenopathy.  Neurological: She is alert and oriented to person, place, and time.  Skin: Skin is warm and dry. She is not diaphoretic.  Psychiatric: She has a normal mood and affect. Her  behavior is normal.  Nursing note and vitals reviewed.  ALBUTEROL NEBULIZER ADMINISTERED IN OFFICE.     Assessment & Plan:   1. Cough   2. Acute recurrent maxillary sinusitis   3. Bronchospasm      1.  Acute maxillary sinusitis:  New. Rx for Augmentin provided; continue Zyrtec D. Rx for Prednisone also provided. 2 . Bronchospasm and cough: :New.  S/p Albuterol nebulizer in office.  Continue Tussin DM and rx for Tessalon perles provided.    Meds ordered this encounter  Medications  . amoxicillin-clavulanate (AUGMENTIN) 875-125 MG per tablet    Sig: Take 1 tablet by mouth 2 (two) times daily.    Dispense:  20 tablet    Refill:  0  . predniSONE (DELTASONE) 20 MG tablet    Sig: Three tablets daily x 2 days then two tablets daily x 5 days then one tablet daily x 3 days    Dispense:  19 tablet    Refill:  0  . benzonatate (TESSALON) 100 MG capsule  Sig: Take 1-2 capsules (100-200 mg total) by mouth 3 (three) times daily as needed for cough.    Dispense:  40 capsule    Refill:  0  . albuterol (PROVENTIL) (2.5 MG/3ML) 0.083% nebulizer solution 2.5 mg    Sig:     No Follow-up on file.   4:38 PM-Discussed treatment plan which includes antibiotics, Albuterol, Augmentin, and Prednisone with pt and pt agreed to plan.     I personally performed the services described in this documentation, which was scribed in my presence. The recorded information has been reviewed and considered.  Reginia Forts, M.D.  [facility] [address] [place], [place]  [address] [phone] phone [phone] fax

## 2014-02-17 ENCOUNTER — Telehealth: Payer: Self-pay

## 2014-02-17 NOTE — Telephone Encounter (Signed)
The patient called to ask for a different medication to be filled for her illness.  She was seen on 02/08/14 by Dr. Tamala Julian and given prescriptions for her illness.  She said that she took the medications as prescribed, but her cough has not improved, and she isn't able to sleep.  Please advise.  Thank you.  CB#: [phone]

## 2014-02-19 MED ORDER — HYDROCOD POLST-CHLORPHEN POLST 10-8 MG/5ML PO LQCR: 5.0000 mL | Freq: Two times a day (BID) | ORAL | Status: DC | PRN

## 2014-02-19 NOTE — Telephone Encounter (Signed)
Has completed all the medications prescribed. Cough is better, but still terrible, worse at night (prevents her from sleep). Not productive. Has taken tussin DM, Zyrtec D, rescue inhaler without benefit. Stress incontinence.  No longer feels ill, just can't stop coughing.  Meds ordered this encounter  Medications  . chlorpheniramine-HYDROcodone (TUSSIONEX PENNKINETIC ER) 10-8 MG/5ML LQCR    Sig: Take 5 mLs by mouth every 12 (twelve) hours as needed for cough (cough). Do not take other hydrocodone containing products with this medication.    Dispense:  100 mL    Refill:  0    Order Specific Question:  Supervising Provider    Answer:  [name], [name] [[id number]]

## 2014-02-19 NOTE — Telephone Encounter (Signed)
Pt's family member picked up rx from [facility] basket 02/19/14 @2 :110. Thank you

## 2014-03-29 ENCOUNTER — Ambulatory Visit (INDEPENDENT_AMBULATORY_CARE_PROVIDER_SITE_OTHER): Payer: BC Managed Care – PPO | Admitting: Obstetrics

## 2014-03-29 ENCOUNTER — Encounter: Payer: Self-pay | Admitting: Obstetrics

## 2014-03-29 VITALS — BP 124/89 | HR 97 | Temp 98.2°F | Ht 60.0 in | Wt 176.0 lb

## 2014-03-29 DIAGNOSIS — Z01419 Encounter for gynecological examination (general) (routine) without abnormal findings: Secondary | ICD-10-CM

## 2014-03-29 DIAGNOSIS — N393 Stress incontinence (female) (male): Secondary | ICD-10-CM

## 2014-03-29 DIAGNOSIS — N939 Abnormal uterine and vaginal bleeding, unspecified: Secondary | ICD-10-CM

## 2014-03-29 DIAGNOSIS — R102 Pelvic and perineal pain: Secondary | ICD-10-CM

## 2014-03-29 DIAGNOSIS — Z975 Presence of (intrauterine) contraceptive device: Secondary | ICD-10-CM

## 2014-03-29 MED ORDER — METHOCARBAMOL 500 MG PO TABS: 500.0000 mg | ORAL_TABLET | Freq: Four times a day (QID) | ORAL | Status: DC | PRN

## 2014-03-29 MED ORDER — CYCLOBENZAPRINE HCL 10 MG PO TABS: 10.0000 mg | ORAL_TABLET | Freq: Three times a day (TID) | ORAL | Status: DC | PRN

## 2014-03-29 MED ORDER — METHOCARBAMOL 500 MG PO TABS: 500.0000 mg | ORAL_TABLET | Freq: Three times a day (TID) | ORAL | Status: DC | PRN

## 2014-03-30 ENCOUNTER — Encounter: Payer: Self-pay | Admitting: Obstetrics

## 2014-03-30 NOTE — Progress Notes (Signed)
Subjective:        Aimee King is a 34 y.o. female here for a routine exam.  Current complaints: Loss of urine with cough, sneeze, etc..    Personal health questionnaire:  Is patient Ashkenazi Jewish, have a family history of breast and/or ovarian cancer: no Is there a family history of uterine cancer diagnosed at age < 13, gastrointestinal cancer, urinary tract cancer, family member who is a Field seismologist syndrome-associated carrier: no Is the patient overweight and hypertensive, family history of diabetes, personal history of gestational diabetes, preeclampsia or PCOS: no Is patient over 56, have PCOS,  family history of premature CHD under age 1, diabetes, smoke, have hypertension or peripheral artery disease:  no At any time, has a partner hit, kicked or otherwise hurt or frightened you?: no Over the past 2 weeks, have you felt down, depressed or hopeless?: no Over the past 2 weeks, have you felt little interest or pleasure in doing things?:no   Gynecologic History No LMP recorded. Patient is not currently having periods (Reason: IUD). Contraception: IUD Last Pap: 2015. Results were: normal Last mammogram: n/a. Results were: n/a  Obstetric History OB History  No data available    Past Medical History  Diagnosis Date  . ADD (attention deficit disorder)   . Allergy   . Depression   . Anemia   . Attention deficit disorder (ADD)   . Endometriosis     Past Surgical History  Procedure Laterality Date  . Cesarean section  2004, 2008  . Cholecystectomy  2004  . Laparoscopy  2002    endometriosis  . Tubal ligation  2008  . Cholecystectomy    . Cesarean section    . Tubal ligation       Current outpatient prescriptions:  .  amphetamine-dextroamphetamine (ADDERALL XR) 20 MG 24 hr capsule, Take 20 mg by mouth every morning., Disp: , Rfl:  .  levonorgestrel (MIRENA) 20 MCG/24HR IUD, 1 each by Intrauterine route once., Disp: , Rfl:  .  chlorpheniramine-HYDROcodone  (TUSSIONEX PENNKINETIC ER) 10-8 MG/5ML LQCR, Take 5 mLs by mouth every 12 (twelve) hours as needed for cough (cough). Do not take other hydrocodone containing products with this medication. (Patient not taking: Reported on 03/29/2014), Disp: 100 mL, Rfl: 0 .  cyclobenzaprine (FLEXERIL) 10 MG tablet, Take 1 tablet (10 mg total) by mouth every 8 (eight) hours as needed for muscle spasms., Disp: 42 tablet, Rfl: 1 .  methocarbamol (ROBAXIN) 500 MG tablet, Take 1 tablet (500 mg total) by mouth every 8 (eight) hours as needed for muscle spasms., Disp: 42 tablet, Rfl: 2 Allergies  Allergen Reactions  . Codeine Nausea And Vomiting  . Codeine Nausea And Vomiting  . Latex Itching  . Latex     Feels like little cuts in her vagina.    History  Substance Use Topics  . Smoking status: Never Smoker   . Smokeless tobacco: Not on file  . Alcohol Use: 0.0 oz/week    0 Not specified per week     Comment: ocassionally    Family History  Problem Relation Age of Onset  . Hypertension Mother   . Hypertension Father   . Diabetes Mother   . Heart disease Father   . Diabetes Father   . Diabetes Maternal Grandmother       Review of Systems  Constitutional: negative for fatigue and weight loss Respiratory: negative for cough and wheezing Cardiovascular: negative for chest pain, fatigue and palpitations Gastrointestinal: negative for abdominal pain  and change in bowel habits Musculoskeletal:negative for myalgias Neurological: negative for gait problems and tremors Behavioral/Psych: negative for abusive relationship, depression Endocrine: negative for temperature intolerance   Genitourinary:negative for abnormal menstrual periods, genital lesions, hot flashes, sexual problems and vaginal discharge Integument/breast: negative for breast lump, breast tenderness, nipple discharge and skin lesion(s)    Objective:       BP 124/89 mmHg  Pulse 97  Temp(Src) 98.2 F (36.8 C)  Ht 5' (1.524 m)  Wt 176 lb  (79.833 kg)  BMI 34.37 kg/m2 General:   alert  Skin:   no rash or abnormalities  Lungs:   clear to auscultation bilaterally  Heart:   regular rate and rhythm, S1, S2 normal, no murmur, click, rub or gallop  Breasts:   normal without suspicious masses, skin or nipple changes or axillary nodes  Abdomen:  normal findings: no organomegaly, soft, non-tender and no hernia  Pelvis:  External genitalia: normal general appearance Urinary system: urethral meatus normal and bladder without fullness, nontender Vaginal: normal without tenderness, induration or masses Cervix: normal appearance Adnexa: normal bimanual exam Uterus: anteverted and non-tender, normal size   Lab Review Urine pregnancy test Labs reviewed no Radiologic studies reviewed no    Assessment:    Healthy female exam.    SUI  IUD surveillance   Plan:   Patient plans F/U with Dr. Delsa Sale for SUI.  Education reviewed: low fat, low cholesterol diet, self breast exams and weight bearing exercise. Contraception: IUD. Follow up in: prn month.   Meds ordered this encounter  Medications  . DISCONTD: methocarbamol (ROBAXIN) 500 MG tablet    Sig: Take 1 tablet (500 mg total) by mouth every 6 (six) hours as needed for muscle spasms.    Dispense:  42 tablet    Refill:  2  . methocarbamol (ROBAXIN) 500 MG tablet    Sig: Take 1 tablet (500 mg total) by mouth every 8 (eight) hours as needed for muscle spasms.    Dispense:  42 tablet    Refill:  2  . cyclobenzaprine (FLEXERIL) 10 MG tablet    Sig: Take 1 tablet (10 mg total) by mouth every 8 (eight) hours as needed for muscle spasms.    Dispense:  42 tablet    Refill:  1   Orders Placed This Encounter  Procedures  . SureSwab, Vaginosis/Vaginitis Plus

## 2014-04-02 LAB — PAP IG AND HPV HIGH-RISK: HPV DNA High Risk: NOT DETECTED

## 2014-04-02 LAB — SURESWAB, VAGINOSIS/VAGINITIS PLUS
Atopobium vaginae: 6.6 Log (cells/mL)
BV CATEGORY: UNDETERMINED — AB
C. GLABRATA, DNA: NOT DETECTED
C. TROPICALIS, DNA: NOT DETECTED
C. albicans, DNA: DETECTED — AB
C. parapsilosis, DNA: NOT DETECTED
C. trachomatis RNA, TMA: NOT DETECTED
GARDNERELLA VAGINALIS: 7.7 Log (cells/mL)
LACTOBACILLUS SPECIES: 4.8 Log (cells/mL)
MEGASPHAERA SPECIES: 6.8 Log (cells/mL)
N. gonorrhoeae RNA, TMA: NOT DETECTED
T. VAGINALIS RNA, QL TMA: NOT DETECTED

## 2014-04-03 ENCOUNTER — Other Ambulatory Visit: Payer: Self-pay | Admitting: Obstetrics

## 2014-04-03 DIAGNOSIS — B373 Candidiasis of vulva and vagina: Secondary | ICD-10-CM

## 2014-04-03 DIAGNOSIS — B3731 Acute candidiasis of vulva and vagina: Secondary | ICD-10-CM

## 2014-04-03 DIAGNOSIS — N760 Acute vaginitis: Principal | ICD-10-CM

## 2014-04-03 DIAGNOSIS — B9689 Other specified bacterial agents as the cause of diseases classified elsewhere: Secondary | ICD-10-CM

## 2014-04-03 MED ORDER — METRONIDAZOLE 500 MG PO TABS: 500.0000 mg | ORAL_TABLET | Freq: Two times a day (BID) | ORAL | Status: DC

## 2014-04-03 MED ORDER — FLUCONAZOLE 150 MG PO TABS: 150.0000 mg | ORAL_TABLET | Freq: Once | ORAL | Status: DC

## 2014-04-06 ENCOUNTER — Telehealth: Payer: Self-pay | Admitting: *Deleted

## 2014-04-06 NOTE — Telephone Encounter (Signed)
Patient contacted the office requesting lab results. Attempted to contact the patient and left message for patient to call the office.

## 2014-05-01 NOTE — Telephone Encounter (Signed)
Contacted patient and reviewed lab results. Patient verbalized understanding,.

## 2014-11-12 ENCOUNTER — Ambulatory Visit (INDEPENDENT_AMBULATORY_CARE_PROVIDER_SITE_OTHER): Payer: BC Managed Care – PPO | Admitting: Physician Assistant

## 2014-11-12 ENCOUNTER — Ambulatory Visit (INDEPENDENT_AMBULATORY_CARE_PROVIDER_SITE_OTHER): Payer: BC Managed Care – PPO

## 2014-11-12 VITALS — BP 110/70 | HR 79 | Temp 98.1°F | Resp 16 | Ht 59.75 in | Wt 185.0 lb

## 2014-11-12 DIAGNOSIS — M5441 Lumbago with sciatica, right side: Secondary | ICD-10-CM

## 2014-11-12 DIAGNOSIS — F988 Other specified behavioral and emotional disorders with onset usually occurring in childhood and adolescence: Secondary | ICD-10-CM | POA: Insufficient documentation

## 2014-11-12 DIAGNOSIS — K5900 Constipation, unspecified: Secondary | ICD-10-CM

## 2014-11-12 DIAGNOSIS — Z113 Encounter for screening for infections with a predominantly sexual mode of transmission: Secondary | ICD-10-CM

## 2014-11-12 DIAGNOSIS — R1030 Lower abdominal pain, unspecified: Secondary | ICD-10-CM

## 2014-11-12 DIAGNOSIS — N393 Stress incontinence (female) (male): Secondary | ICD-10-CM | POA: Insufficient documentation

## 2014-11-12 DIAGNOSIS — N3090 Cystitis, unspecified without hematuria: Secondary | ICD-10-CM | POA: Diagnosis not present

## 2014-11-12 LAB — COMPREHENSIVE METABOLIC PANEL
ALT: 23 U/L (ref 6–29)
AST: 17 U/L (ref 10–30)
Albumin: 4.1 g/dL (ref 3.6–5.1)
Alkaline Phosphatase: 67 U/L (ref 33–115)
BUN: 10 mg/dL (ref 7–25)
CO2: 29 mmol/L (ref 20–31)
Calcium: 8.7 mg/dL (ref 8.6–10.2)
Chloride: 104 mmol/L (ref 98–110)
Creat: 0.7 mg/dL (ref 0.50–1.10)
Glucose, Bld: 81 mg/dL (ref 65–99)
Potassium: 4 mmol/L (ref 3.5–5.3)
Sodium: 137 mmol/L (ref 135–146)
TOTAL PROTEIN: 7.3 g/dL (ref 6.1–8.1)
Total Bilirubin: 0.6 mg/dL (ref 0.2–1.2)

## 2014-11-12 LAB — POCT URINALYSIS DIPSTICK
Bilirubin, UA: NEGATIVE
Glucose, UA: NEGATIVE
Ketones, UA: NEGATIVE
Nitrite, UA: NEGATIVE
Protein, UA: NEGATIVE
SPEC GRAV UA: 1.025
Urobilinogen, UA: 0.2
pH, UA: 6

## 2014-11-12 LAB — CBC
HCT: 42.2 % (ref 36.0–46.0)
Hemoglobin: 14.2 g/dL (ref 12.0–15.0)
MCH: 29.2 pg (ref 26.0–34.0)
MCHC: 33.6 g/dL (ref 30.0–36.0)
MCV: 86.7 fL (ref 78.0–100.0)
MPV: 10.9 fL (ref 8.6–12.4)
PLATELETS: 281 10*3/uL (ref 150–400)
RBC: 4.87 MIL/uL (ref 3.87–5.11)
RDW: 13.4 % (ref 11.5–15.5)
WBC: 7.1 10*3/uL (ref 4.0–10.5)

## 2014-11-12 LAB — POCT UA - MICROSCOPIC ONLY
Casts, Ur, LPF, POC: NEGATIVE
Crystals, Ur, HPF, POC: NEGATIVE
MUCUS UA: NEGATIVE
Yeast, UA: NEGATIVE

## 2014-11-12 LAB — POCT WET PREP WITH KOH
KOH PREP POC: NEGATIVE
TRICHOMONAS UA: NEGATIVE
Yeast Wet Prep HPF POC: NEGATIVE

## 2014-11-12 LAB — POCT URINE PREGNANCY: Preg Test, Ur: NEGATIVE

## 2014-11-12 MED ORDER — NITROFURANTOIN MONOHYD MACRO 100 MG PO CAPS: 100.0000 mg | ORAL_CAPSULE | Freq: Two times a day (BID) | ORAL | Status: DC

## 2014-11-12 NOTE — Patient Instructions (Addendum)
Take antibiotic twice a day for 7 days. Take with food and take probiotic daily. i will call you about lab results and urine culture. Take miralax twice a day until stools are loose., then once a day for 1 week. You may need to take miralax daily/every other day. You will get a phone call to make appt with orthopedic specialist.  Call and make appt with GYN to evaluate mirena. Exercise in a shallow pool. Try melatonin up to 10 mg and see if helps. If not you can try benadryl.  Back Exercises Back exercises help treat and prevent back injuries. The goal of back exercises is to increase the strength of your abdominal and back muscles and the flexibility of your back. These exercises should be started when you no longer have back pain. Back exercises include:  Pelvic Tilt. Lie on your back with your knees bent. Tilt your pelvis until the lower part of your back is against the floor. Hold this position 5 to 10 sec and repeat 5 to 10 times.  Knee to Chest. Pull first 1 knee up against your chest and hold for 20 to 30 seconds, repeat this with the other knee, and then both knees. This may be done with the other leg straight or bent, whichever feels better.  Sit-Ups or Curl-Ups. Bend your knees 90 degrees. Start with tilting your pelvis, and do a partial, slow sit-up, lifting your trunk only 30 to 45 degrees off the floor. Take at least 2 to 3 seconds for each sit-up. Do not do sit-ups with your knees out straight. If partial sit-ups are difficult, simply do the above but with only tightening your abdominal muscles and holding it as directed.  Hip-Lift. Lie on your back with your knees flexed 90 degrees. Push down with your feet and shoulders as you raise your hips a couple inches off the floor; hold for 10 seconds, repeat 5 to 10 times.  Back arches. Lie on your stomach, propping yourself up on bent elbows. Slowly press on your hands, causing an arch in your low back. Repeat 3 to 5 times. Any initial  stiffness and discomfort should lessen with repetition over time.  Shoulder-Lifts. Lie face down with arms beside your body. Keep hips and torso pressed to floor as you slowly lift your head and shoulders off the floor. Do not overdo your exercises, especially in the beginning. Exercises may cause you some mild back discomfort which lasts for a few minutes; however, if the pain is more severe, or lasts for more than 15 minutes, do not continue exercises until you see your caregiver. Improvement with exercise therapy for back problems is slow.  See your caregivers for assistance with developing a proper back exercise program. Document Released: 03/19/2004 Document Revised: 05/04/2011 Document Reviewed: 12/11/2010 [facility] Patient Information 2015 [place], [place]. This information is not intended to replace advice given to you by your health care provider. Make sure you discuss any questions you have with your health care provider.
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
## 2014-11-13 LAB — HIV ANTIBODY (ROUTINE TESTING W REFLEX): HIV: NONREACTIVE

## 2014-11-13 LAB — GC/CHLAMYDIA PROBE AMP
CT Probe RNA: NEGATIVE
GC Probe RNA: NEGATIVE

## 2014-11-13 LAB — RPR

## 2014-11-14 LAB — URINE CULTURE: Colony Count: >=100000

## 2014-11-18 ENCOUNTER — Telehealth: Payer: Self-pay | Admitting: Physician Assistant

## 2014-11-18 DIAGNOSIS — N3090 Cystitis, unspecified without hematuria: Secondary | ICD-10-CM

## 2014-11-18 MED ORDER — CIPROFLOXACIN HCL 500 MG PO TABS: 500.0000 mg | ORAL_TABLET | Freq: Two times a day (BID) | ORAL | Status: AC

## 2014-11-18 NOTE — Telephone Encounter (Signed)
Spoke with pt. Urine culture showed intermediate resistance to macrobid. She states her abdominal pain is not all the way improved yet. She will stop macrobid and start cipro 500 mg BID x 3 days. She will let me know if pain persists.

## 2014-11-19 ENCOUNTER — Telehealth: Payer: Self-pay | Admitting: *Deleted

## 2014-11-19 NOTE — Telephone Encounter (Signed)
Patient states she is due to have her IUD out this November. She was recently in to see her primary and she said they could not find her strings. She would like an appointment for evaluation and to get her IUD out and new one put in. 3:08 LM on VM to call for an appointment per Dr Jodi Mourning.

## 2014-11-23 ENCOUNTER — Telehealth: Payer: Self-pay

## 2014-11-23 NOTE — Telephone Encounter (Signed)
Patient left voicemail today at 1:48pm requesting x-rays on disc. She has an upcoming ortho appt on Monday at 3pm and needs to take disc. Please complete verbal request form and call when ready at [phone].

## 2014-11-23 NOTE — Telephone Encounter (Signed)
X-ray disc ready. Left message to pick up.

## 2014-11-26 ENCOUNTER — Encounter: Payer: Self-pay | Admitting: Obstetrics

## 2014-11-26 ENCOUNTER — Ambulatory Visit (INDEPENDENT_AMBULATORY_CARE_PROVIDER_SITE_OTHER): Payer: BC Managed Care – PPO | Admitting: Obstetrics

## 2014-11-26 VITALS — BP 105/72 | HR 97 | Wt 183.0 lb

## 2014-11-26 DIAGNOSIS — Z3202 Encounter for pregnancy test, result negative: Secondary | ICD-10-CM | POA: Diagnosis not present

## 2014-11-26 DIAGNOSIS — Z30432 Encounter for removal of intrauterine contraceptive device: Secondary | ICD-10-CM | POA: Diagnosis not present

## 2014-11-26 DIAGNOSIS — Z30433 Encounter for removal and reinsertion of intrauterine contraceptive device: Secondary | ICD-10-CM

## 2014-11-26 DIAGNOSIS — Z30014 Encounter for initial prescription of intrauterine contraceptive device: Secondary | ICD-10-CM | POA: Diagnosis not present

## 2014-11-26 DIAGNOSIS — Z01818 Encounter for other preprocedural examination: Secondary | ICD-10-CM

## 2014-11-26 DIAGNOSIS — Z30430 Encounter for insertion of intrauterine contraceptive device: Secondary | ICD-10-CM | POA: Diagnosis not present

## 2014-11-26 LAB — POCT URINE PREGNANCY: Preg Test, Ur: NEGATIVE

## 2014-11-26 NOTE — Progress Notes (Addendum)
Subjective:  IUD Removal and Re-Insertion Procedure Note  Pre-operative Diagnosis: Desire for Removal and Reinsertion of New Mirena IUD  Post-operative Diagnosis: same  Indications: contraception  Procedure Details  Urine pregnancy test was done in office and result was negative.  The risks (including infection, bleeding, pain, and uterine perforation) and benefits of the procedure were explained to the patient and Written informed consent was obtained.    Cervix cleansed with Betadine. Uterus sounded to 8 cm.  IUD strings of expiring IUD not visible.  Kelley forceps advanced down cervical canal and rotated and the IUD string was grasped in the cervical canal.  The expiring IUD was then removed without complications, intact. The new IUD was theninserted without difficulty. String visible and trimmed. Patient tolerated procedure well.  IUD Information: Mirena, Lot # [id number], Expiration date 73 / 2018.  Condition: Stable  Complications: None  Plan:  The patient was advised to call for any fever or for prolonged or severe pain or bleeding. She was advised to use NSAID as needed for mild to moderate pain.   Attending Physician Documentation: I was present for or participated in the entire procedure, including opening and closing.     Aimee King is a 34 y.o. female who presents for IUD Removal and Reinsertion of new IUD.  The IUD that is in place expires in November 2016. The patient has no complaints today. The patient is sexually active. Pertinent past medical history: none.  The information documented in the HPI was reviewed and verified.  Menstrual History: OB History    No data available       No LMP recorded. Patient is not currently having periods (Reason: IUD).   Patient Active Problem List   Diagnosis Date Noted  . Stress incontinence 11/12/2014  . ADD (attention deficit disorder) 11/12/2014  . Back pain 10/07/2012  . Endometriosis 10/07/2012  . Foot  pain, bilateral 10/07/2012  . Stomach pain 10/07/2012   Past Medical History  Diagnosis Date  . ADD (attention deficit disorder)   . Allergy   . Depression   . Anemia   . Attention deficit disorder (ADD)   . Endometriosis     Past Surgical History  Procedure Laterality Date  . Cesarean section  2004, 2008  . Cholecystectomy  2004  . Laparoscopy  2002    endometriosis  . Tubal ligation  2008  . Cholecystectomy    . Cesarean section    . Tubal ligation       Current outpatient prescriptions:  .  amphetamine-dextroamphetamine (ADDERALL XR) 20 MG 24 hr capsule, Take 20 mg by mouth every morning., Disp: , Rfl:  .  levonorgestrel (MIRENA) 20 MCG/24HR IUD, 1 each by Intrauterine route once., Disp: , Rfl:  Allergies  Allergen Reactions  . Codeine Nausea And Vomiting  . Codeine Nausea And Vomiting  . Latex Itching  . Latex     Feels like little cuts in her vagina.    Social History  Substance Use Topics  . Smoking status: Never Smoker   . Smokeless tobacco: Not on file  . Alcohol Use: 0.0 oz/week    0 Standard drinks or equivalent per week     Comment: ocassionally    Family History  Problem Relation Age of Onset  . Hypertension Mother   . Hypertension Father   . Diabetes Mother   . Heart disease Father   . Diabetes Father   . Diabetes Maternal Grandmother        Review  of Systems Constitutional: negative for weight loss Genitourinary:negative for abnormal menstrual periods and vaginal discharge   Objective:   BP 105/72 mmHg  Pulse 97  Wt 183 lb (83.008 kg)           General:  Alert and no distress Abdomen:  normal findings: no organomegaly, soft, non-tender and no hernia  Pelvis:  External genitalia: normal general appearance Urinary system: urethral meatus normal and bladder without fullness, nontender Vaginal: normal without tenderness, induration or masses Cervix: normal appearance.  IUD string not visible. Adnexa: normal bimanual exam Uterus:  anteverted and non-tender, normal size   Lab Review Urine pregnancy test Labs reviewed yes Radiologic studies reviewed no    Assessment:    34 y.o., continuing IUD, no contraindications.   Plan:   Mirena IUD removed and new one inserted without complications.( see procedure note )   All questions answered. Contraception: IUD. Discussed healthy lifestyle modifications. Follow up in 5 weeks.    No orders of the defined types were placed in this encounter.   Orders Placed This Encounter  Procedures  . POCT urine pregnancy

## 2014-12-28 ENCOUNTER — Ambulatory Visit (INDEPENDENT_AMBULATORY_CARE_PROVIDER_SITE_OTHER): Payer: BC Managed Care – PPO | Admitting: Internal Medicine

## 2014-12-28 VITALS — BP 110/80 | HR 87 | Temp 98.6°F | Resp 18 | Ht 59.75 in | Wt 182.0 lb

## 2014-12-28 DIAGNOSIS — R05 Cough: Secondary | ICD-10-CM

## 2014-12-28 DIAGNOSIS — IMO0001 Reserved for inherently not codable concepts without codable children: Secondary | ICD-10-CM

## 2014-12-28 DIAGNOSIS — M791 Myalgia: Secondary | ICD-10-CM | POA: Diagnosis not present

## 2014-12-28 DIAGNOSIS — R059 Cough, unspecified: Secondary | ICD-10-CM

## 2014-12-28 DIAGNOSIS — M609 Myositis, unspecified: Secondary | ICD-10-CM

## 2014-12-28 LAB — POCT CBC
GRANULOCYTE PERCENT: 64.5 % (ref 37–80)
HCT, POC: 39.7 % (ref 37.7–47.9)
Hemoglobin: 13.4 g/dL (ref 12.2–16.2)
Lymph, poc: 2 (ref 0.6–3.4)
MCH: 29 pg (ref 27–31.2)
MCHC: 33.8 g/dL (ref 31.8–35.4)
MCV: 85.9 fL (ref 80–97)
MID (CBC): 0.6 (ref 0–0.9)
MPV: 7.8 fL (ref 0–99.8)
PLATELET COUNT, POC: 223 10*3/uL (ref 142–424)
POC Granulocyte: 4.8 (ref 2–6.9)
POC LYMPH PERCENT: 27.3 %L (ref 10–50)
POC MID %: 8.2 % (ref 0–12)
RBC: 4.62 M/uL (ref 4.04–5.48)
RDW, POC: 13.4 %
WBC: 7.5 10*3/uL (ref 4.6–10.2)

## 2014-12-28 MED ORDER — HYDROCODONE-HOMATROPINE 5-1.5 MG/5ML PO SYRP: 5.0000 mL | ORAL_SOLUTION | Freq: Four times a day (QID) | ORAL | Status: DC | PRN

## 2014-12-28 NOTE — Progress Notes (Signed)
Subjective:  This chart was scribed for Aimee Lin, MD by Thea Alken, ED Scribe. This patient was seen in room 1 and the patient's care was started at 4:23 PM.   Patient ID: Aimee King, female    DOB: 1980-07-06, 34 y.o.   MRN: [id number]  HPI Chief Complaint  Patient presents with  . Cough    since Tuesday  . Fatigue    since Tuesday  . Shortness of Breath    since Tuesday   HPI Comments: Aimee King is a 34 y.o. female who presents to the [facility] complaining of mild occas productive cough for 3 days. Cough is productive in the morning . She reports associated SOB, sore throat, body aches and fatigue. She has been taking allegra D and thera flu without relief. She has been staying hydrated, drinking plenty of fluids. She denies fever, but has chills   Patient Active Problem List   Diagnosis Date Noted  . Stress incontinence 11/12/2014  . ADD (attention deficit disorder) 11/12/2014  . Back pain 10/07/2012  . Endometriosis 10/07/2012  . Foot pain, bilateral 10/07/2012  . Stomach pain 10/07/2012   No hx asthma nonsmok Prior to Admission medications   Medication Sig Start Date End Date Taking? Authorizing Provider  amphetamine-dextroamphetamine (ADDERALL XR) 20 MG 24 hr capsule Take 20 mg by mouth every morning.   Yes Historical Provider, MD  levonorgestrel (MIRENA) 20 MCG/24HR IUD 1 each by Intrauterine route once.   Yes Historical Provider, MD  PARoxetine (PAXIL) 10 MG tablet Take 10 mg by mouth daily.   Yes Historical Provider, MD   Review of Systems [place]    Objective:   Physical Exam  Constitutional: She is oriented to person, place, and time. She appears well-developed and well-nourished. No distress.  HENT:  Head: Normocephalic and atraumatic.  Right Ear: External ear normal.  Left Ear: External ear normal.  Nose: Nose normal.  Mouth/Throat: Oropharynx is clear and moist. No oropharyngeal exudate or posterior  oropharyngeal erythema.  Eyes: Conjunctivae and EOM are normal. Pupils are equal, round, and reactive to light.  Neck: Neck supple.  Cardiovascular: Normal rate, regular rhythm and normal heart sounds.   No murmur heard. Pulmonary/Chest: Effort normal and breath sounds normal. She has no wheezes. She has no rales.  Abdominal: There is no tenderness.  Musculoskeletal: Normal range of motion.  Lymphadenopathy:    She has no cervical adenopathy.  Neurological: She is alert and oriented to person, place, and time.  Skin: Skin is warm and dry. No rash noted.  Psychiatric: She has a normal mood and affect. Her behavior is normal.  Nursing note and vitals reviewed.  Filed Vitals:   12/28/14 1556  BP: 110/80  Pulse: 87  Temp: 98.6 F (37 C)  TempSrc: Oral  Resp: 18  Height: 4' 11.75" (1.518 m)  Weight: 182 lb (82.555 kg)  SpO2: 99%   Results for orders placed or performed in visit on 12/28/14  POCT CBC  Result Value Ref Range   WBC 7.5 4.6 - 10.2 K/uL   Lymph, poc 2.0 0.6 - 3.4   POC LYMPH PERCENT 27.3 10 - 50 %L   MID (cbc) 0.6 0 - 0.9   POC MID % 8.2 0 - 12 %M   POC Granulocyte 4.8 2 - 6.9   Granulocyte percent 64.5 37 - 80 %G   RBC 4.62 4.04 - 5.48 M/uL   Hemoglobin 13.4 12.2 - 16.2 g/dL   HCT, POC  39.7 37.7 - 47.9 %   MCV 85.9 80 - 97 fL   MCH, POC 29.0 27 - 31.2 pg   MCHC 33.8 31.8 - 35.4 g/dL   RDW, POC 13.4 %   Platelet Count, POC 223 142 - 424 K/uL   MPV 7.8 0 - 99.8 fL   Assessment & Plan:   1. Cough   2. Myalgia and myositis   Viral illness likely Tylenol/fluids  Bedrest 3d F/u mon if not well Meds ordered this encounter  Medications  . HYDROcodone-homatropine (HYCODAN) 5-1.5 MG/5ML syrup    Sig: Take 5 mLs by mouth every 6 (six) hours as needed.    Dispense:  120 mL    Refill:  0     I have completed the patient encounter in its entirety as documented by the scribe, with editing by me where necessary. Aimee King P. Laney Pastor, M.D.

## 2015-01-07 ENCOUNTER — Ambulatory Visit (INDEPENDENT_AMBULATORY_CARE_PROVIDER_SITE_OTHER): Payer: BC Managed Care – PPO | Admitting: Obstetrics

## 2015-01-07 VITALS — BP 107/75 | HR 98 | Wt 183.0 lb

## 2015-01-07 DIAGNOSIS — Z30431 Encounter for routine checking of intrauterine contraceptive device: Secondary | ICD-10-CM | POA: Diagnosis not present

## 2015-01-08 ENCOUNTER — Encounter: Payer: Self-pay | Admitting: Obstetrics

## 2015-01-08 NOTE — Progress Notes (Signed)
Subjective:    Aimee King is a 34 y.o. female who presents for follow up after IUD insertion. The patient has no complaints today. The patient is sexually active. Pertinent past medical history: none.  The information documented in the HPI was reviewed and verified.  Menstrual History: OB History    No data available       No LMP recorded. Patient is not currently having periods (Reason: IUD).   Patient Active Problem List   Diagnosis Date Noted  . Stress incontinence 11/12/2014  . ADD (attention deficit disorder) 11/12/2014  . Back pain 10/07/2012  . Endometriosis 10/07/2012  . Foot pain, bilateral 10/07/2012  . Stomach pain 10/07/2012   Past Medical History  Diagnosis Date  . ADD (attention deficit disorder)   . Allergy   . Depression   . Anemia   . Attention deficit disorder (ADD)   . Endometriosis     Past Surgical History  Procedure Laterality Date  . Cesarean section  2004, 2008  . Cholecystectomy  2004  . Laparoscopy  2002    endometriosis  . Tubal ligation  2008  . Cholecystectomy    . Cesarean section    . Tubal ligation       Current outpatient prescriptions:  .  amphetamine-dextroamphetamine (ADDERALL XR) 20 MG 24 hr capsule, Take 20 mg by mouth every morning., Disp: , Rfl:  .  HYDROcodone-homatropine (HYCODAN) 5-1.5 MG/5ML syrup, Take 5 mLs by mouth every 6 (six) hours as needed., Disp: 120 mL, Rfl: 0 .  levonorgestrel (MIRENA) 20 MCG/24HR IUD, 1 each by Intrauterine route once., Disp: , Rfl:  .  PARoxetine (PAXIL) 10 MG tablet, Take 10 mg by mouth daily., Disp: , Rfl:  Allergies  Allergen Reactions  . Codeine Nausea And Vomiting  . Codeine Nausea And Vomiting  . Latex Itching  . Latex     Feels like little cuts in her vagina.    Social History  Substance Use Topics  . Smoking status: Never Smoker   . Smokeless tobacco: Not on file  . Alcohol Use: 0.0 oz/week    0 Standard drinks or equivalent per week     Comment: ocassionally     Family History  Problem Relation Age of Onset  . Hypertension Mother   . Hypertension Father   . Diabetes Mother   . Heart disease Father   . Diabetes Father   . Diabetes Maternal Grandmother        Review of Systems Constitutional: negative for weight loss Genitourinary:negative for abnormal menstrual periods and vaginal discharge   Objective:   BP 107/75 mmHg  Pulse 98  Wt 183 lb (83.008 kg)           General:  Alert and no distress Abdomen:  normal findings: no organomegaly, soft, non-tender and no hernia  Pelvis:  External genitalia: normal general appearance Urinary system: urethral meatus normal and bladder without fullness, nontender Vaginal: normal without tenderness, induration or masses Cervix: normal appearance.  IUD string visible and normal length. Adnexa: normal bimanual exam Uterus: anteverted and non-tender, normal size   Lab Review Urine pregnancy test Labs reviewed yes Radiologic studies reviewed no   Assessment:    34 y.o., continuing IUD, no contraindications.   Plan:    All questions answered. Discussed healthy lifestyle modifications. Neurosurgeon distributed. Follow up as needed.  No orders of the defined types were placed in this encounter.   No orders of the defined types were placed in this encounter.

## 2015-02-20 ENCOUNTER — Ambulatory Visit (INDEPENDENT_AMBULATORY_CARE_PROVIDER_SITE_OTHER): Payer: BC Managed Care – PPO | Admitting: Emergency Medicine

## 2015-02-20 VITALS — BP 118/68 | HR 98 | Temp 98.2°F | Resp 14 | Ht 59.75 in | Wt 185.0 lb

## 2015-02-20 DIAGNOSIS — J209 Acute bronchitis, unspecified: Secondary | ICD-10-CM | POA: Diagnosis not present

## 2015-02-20 DIAGNOSIS — R5383 Other fatigue: Secondary | ICD-10-CM

## 2015-02-20 DIAGNOSIS — J0140 Acute pansinusitis, unspecified: Secondary | ICD-10-CM | POA: Diagnosis not present

## 2015-02-20 LAB — TSH: TSH: 1.719 u[IU]/mL (ref 0.350–4.500)

## 2015-02-20 MED ORDER — AMOXICILLIN-POT CLAVULANATE 875-125 MG PO TABS: 1.0000 | ORAL_TABLET | Freq: Two times a day (BID) | ORAL | Status: DC

## 2015-02-20 MED ORDER — PSEUDOEPHEDRINE-GUAIFENESIN ER 60-600 MG PO TB12: 1.0000 | ORAL_TABLET | Freq: Two times a day (BID) | ORAL | Status: DC

## 2015-02-20 MED ORDER — HYDROCOD POLST-CPM POLST ER 10-8 MG/5ML PO SUER: 5.0000 mL | Freq: Two times a day (BID) | ORAL | Status: DC

## 2015-02-20 NOTE — Progress Notes (Signed)
Subjective:  Patient ID: Aimee King, female    DOB: Sep 17, 1980  Age: 34 y.o. MRN: [id number]  CC: Sinusitis; Ear Fullness; and Constipation   HPI Aimee King presents   Patient has fever or chills. She she been fatigue. She's unable sleep due to the cough. She cough is productive purulent sputum. She has no wheezing or shortness of breath. She has nasal congestion pressure ears. She has purulent nasal drainage and postnasal drip. She has pressure and pain in her cheeks and forehead. She has no nausea vomiting or stool change. No rash. Unable to control her symptoms with over-the-counter medication.  History [name] has a past medical history of ADD (attention deficit disorder); Allergy; Depression; Anemia; Attention deficit disorder (ADD); and Endometriosis.   She has past surgical history that includes Cesarean section (2004, 2008); Cholecystectomy (2004); laparoscopy (2002); Tubal ligation (2008); Cholecystectomy; Cesarean section; and Tubal ligation.   Her  family history includes Diabetes in her father, maternal grandmother, and mother; Heart disease in her father; Hypertension in her father and mother.  She   reports that she has never smoked. She does not have any smokeless tobacco history on file. She reports that she drinks alcohol. She reports that she does not use illicit drugs.  Outpatient Prescriptions Prior to Visit  Medication Sig Dispense Refill  . amphetamine-dextroamphetamine (ADDERALL XR) 20 MG 24 hr capsule Take 20 mg by mouth every morning.    Marland Kitchen levonorgestrel (MIRENA) 20 MCG/24HR IUD 1 each by Intrauterine route once.    Marland Kitchen PARoxetine (PAXIL) 10 MG tablet Take 10 mg by mouth daily.    Marland Kitchen HYDROcodone-homatropine (HYCODAN) 5-1.5 MG/5ML syrup Take 5 mLs by mouth every 6 (six) hours as needed. (Patient not taking: Reported on 02/20/2015) 120 mL 0   No facility-administered medications prior to visit.    Social History   Social History  . Marital  Status: Married    Spouse Name: N/A  . Number of Children: N/A  . Years of Education: N/A   Social History Main Topics  . Smoking status: Never Smoker   . Smokeless tobacco: Not on file  . Alcohol Use: 0.0 oz/week    0 Standard drinks or equivalent per week     Comment: ocassionally  . Drug Use: No  . Sexual Activity:    Partners: Male    Birth Control/ Protection: IUD   Other Topics Concern  . Not on file   Social History Narrative   ** Merged History Encounter **         Review of Systems  Constitutional: Positive for fever, chills and fatigue. Negative for appetite change.  HENT: Positive for congestion, ear pain, postnasal drip, rhinorrhea and sinus pressure. Negative for sore throat.   Eyes: Negative for pain and redness.  Respiratory: Positive for cough. Negative for shortness of breath and wheezing.   Cardiovascular: Negative for leg swelling.  Gastrointestinal: Negative for nausea, vomiting, abdominal pain, diarrhea, constipation and blood in stool.  Endocrine: Negative for polyuria.  Genitourinary: Negative for dysuria, urgency, frequency and flank pain.  Musculoskeletal: Negative for gait problem.  Skin: Negative for rash.  Neurological: Negative for weakness and headaches.  Psychiatric/Behavioral: Negative for confusion and decreased concentration. The patient is not nervous/anxious.     Objective:  BP 118/68 mmHg  Pulse 98  Temp(Src) 98.2 F (36.8 C) (Oral)  Resp 14  Ht 4' 11.75" (1.518 m)  Wt 185 lb (83.915 kg)  BMI 36.42 kg/m2  SpO2 97%  Physical Exam  Constitutional: She is oriented to person, place, and time. She appears well-developed and well-nourished. No distress.  HENT:  Head: Normocephalic and atraumatic.  Right Ear: External ear normal.  Left Ear: External ear normal.  Nose: Nose normal.  Eyes: Conjunctivae and EOM are normal. Pupils are equal, round, and reactive to light. No scleral icterus.  Neck: Normal range of motion. Neck  supple. No tracheal deviation present.  Cardiovascular: Normal rate, regular rhythm and normal heart sounds.   Pulmonary/Chest: Effort normal. No respiratory distress. She has no wheezes. She has no rales.  Abdominal: She exhibits no mass. There is no tenderness. There is no rebound and no guarding.  Musculoskeletal: She exhibits no edema.  Lymphadenopathy:    She has no cervical adenopathy.  Neurological: She is alert and oriented to person, place, and time. Coordination normal.  Skin: Skin is warm and dry. No rash noted.  Psychiatric: She has a normal mood and affect. Her behavior is normal.      Assessment & Plan:   [name] was seen today for sinusitis, ear fullness and constipation.  Diagnoses and all orders for this visit:  Acute bronchitis, unspecified organism  Other fatigue -     TSH  Acute pansinusitis, recurrence not specified  Other orders -     amoxicillin-clavulanate (AUGMENTIN) 875-125 MG tablet; Take 1 tablet by mouth 2 (two) times daily. -     pseudoephedrine-guaifenesin (MUCINEX D) 60-600 MG 12 hr tablet; Take 1 tablet by mouth every 12 (twelve) hours. -     chlorpheniramine-HYDROcodone (TUSSIONEX PENNKINETIC ER) 10-8 MG/5ML SUER; Take 5 mLs by mouth 2 (two) times daily.  I have discontinued Ms. Ekwem-Thorpe's HYDROcodone-homatropine. I am also having her start on amoxicillin-clavulanate, pseudoephedrine-guaifenesin, and chlorpheniramine-HYDROcodone. Additionally, I am having her maintain her amphetamine-dextroamphetamine, levonorgestrel, PARoxetine, and polyethylene glycol.  Meds ordered this encounter  Medications  . polyethylene glycol (MIRALAX / GLYCOLAX) packet    Sig: Take 17 g by mouth daily.  Marland Kitchen amoxicillin-clavulanate (AUGMENTIN) 875-125 MG tablet    Sig: Take 1 tablet by mouth 2 (two) times daily.    Dispense:  20 tablet    Refill:  0  . pseudoephedrine-guaifenesin (MUCINEX D) 60-600 MG 12 hr tablet    Sig: Take 1 tablet by mouth every 12 (twelve)  hours.    Dispense:  18 tablet    Refill:  0  . chlorpheniramine-HYDROcodone (TUSSIONEX PENNKINETIC ER) 10-8 MG/5ML SUER    Sig: Take 5 mLs by mouth 2 (two) times daily.    Dispense:  60 mL    Refill:  0    Appropriate red flag conditions were discussed with the patient as well as actions that should be taken.  Patient expressed his understanding.  Follow-up: Return if symptoms worsen or fail to improve.  Roselee Culver, MD

## 2015-02-20 NOTE — Patient Instructions (Signed)

## 2015-04-14 ENCOUNTER — Ambulatory Visit (INDEPENDENT_AMBULATORY_CARE_PROVIDER_SITE_OTHER): Payer: BC Managed Care – PPO | Admitting: Physician Assistant

## 2015-04-14 VITALS — BP 128/76 | HR 138 | Temp 100.3°F | Resp 20 | Ht 59.0 in | Wt 182.0 lb

## 2015-04-14 DIAGNOSIS — R9389 Abnormal findings on diagnostic imaging of other specified body structures: Secondary | ICD-10-CM

## 2015-04-14 DIAGNOSIS — R938 Abnormal findings on diagnostic imaging of other specified body structures: Secondary | ICD-10-CM | POA: Diagnosis not present

## 2015-04-14 DIAGNOSIS — R6889 Other general symptoms and signs: Secondary | ICD-10-CM | POA: Diagnosis not present

## 2015-04-14 DIAGNOSIS — R0981 Nasal congestion: Secondary | ICD-10-CM

## 2015-04-14 DIAGNOSIS — R509 Fever, unspecified: Secondary | ICD-10-CM | POA: Diagnosis not present

## 2015-04-14 LAB — POCT CBC
GRANULOCYTE PERCENT: 85.3 % — AB (ref 37–80)
HCT, POC: 39.9 % (ref 37.7–47.9)
HEMOGLOBIN: 13.7 g/dL (ref 12.2–16.2)
Lymph, poc: 0.5 — AB (ref 0.6–3.4)
MCH: 29.4 pg (ref 27–31.2)
MCHC: 34.2 g/dL (ref 31.8–35.4)
MCV: 85.9 fL (ref 80–97)
MID (cbc): 0.5 (ref 0–0.9)
MPV: 7.7 fL (ref 0–99.8)
PLATELET COUNT, POC: 176 10*3/uL (ref 142–424)
POC Granulocyte: 5.7 (ref 2–6.9)
POC LYMPH PERCENT: 7.8 %L — AB (ref 10–50)
POC MID %: 6.9 %M (ref 0–12)
RBC: 4.65 M/uL (ref 4.04–5.48)
RDW, POC: 13.3 %
WBC: 6.7 10*3/uL (ref 4.6–10.2)

## 2015-04-14 LAB — POCT INFLUENZA A/B
INFLUENZA B, POC: NEGATIVE
Influenza A, POC: POSITIVE — AB

## 2015-04-14 LAB — CBC WITH DIFFERENTIAL/PLATELET
BASOS PCT: 0 % (ref 0–1)
Basophils Absolute: 0 10*3/uL (ref 0.0–0.1)
EOS PCT: 1 % (ref 0–5)
Eosinophils Absolute: 0.1 10*3/uL (ref 0.0–0.7)
HCT: 40.6 % (ref 36.0–46.0)
Hemoglobin: 13.7 g/dL (ref 12.0–15.0)
Lymphocytes Relative: 5 % — ABNORMAL LOW (ref 12–46)
Lymphs Abs: 0.3 10*3/uL — ABNORMAL LOW (ref 0.7–4.0)
MCH: 29.6 pg (ref 26.0–34.0)
MCHC: 33.7 g/dL (ref 30.0–36.0)
MCV: 87.7 fL (ref 78.0–100.0)
MONO ABS: 1 10*3/uL (ref 0.1–1.0)
MPV: 10.3 fL (ref 8.6–12.4)
Monocytes Relative: 16 % — ABNORMAL HIGH (ref 3–12)
NEUTROS ABS: 5.1 10*3/uL (ref 1.7–7.7)
Neutrophils Relative %: 78 % — ABNORMAL HIGH (ref 43–77)
Platelets: 197 10*3/uL (ref 150–400)
RBC: 4.63 MIL/uL (ref 3.87–5.11)
RDW: 13.1 % (ref 11.5–15.5)
WBC: 6.5 10*3/uL (ref 4.0–10.5)

## 2015-04-14 MED ORDER — OSELTAMIVIR PHOSPHATE 75 MG PO CAPS: 75.0000 mg | ORAL_CAPSULE | Freq: Two times a day (BID) | ORAL | Status: DC

## 2015-04-14 MED ORDER — IBUPROFEN 200 MG PO TABS
200.0000 mg | ORAL_TABLET | Freq: Once | ORAL | Status: AC
  Administered 2015-04-14: 200 mg via ORAL

## 2015-04-14 MED ORDER — BENZONATATE 100 MG PO CAPS: 100.0000 mg | ORAL_CAPSULE | Freq: Three times a day (TID) | ORAL | Status: DC | PRN

## 2015-04-14 NOTE — Patient Instructions (Signed)
Please take in water of 64 oz or more per day. I would like you to take mucinex 1200mg  every 12 hours.  Influenza, Adult Influenza ("the flu") is a viral infection of the respiratory tract. It occurs more often in winter months because people spend more time in close contact with one another. Influenza can make you feel very sick. Influenza easily spreads from person to person (contagious). CAUSES  Influenza is caused by a virus that infects the respiratory tract. You can catch the virus by breathing in droplets from an infected person's cough or sneeze. You can also catch the virus by touching something that was recently contaminated with the virus and then touching your mouth, nose, or eyes. RISKS AND COMPLICATIONS You may be at risk for a more severe case of influenza if you smoke cigarettes, have diabetes, have chronic heart disease (such as heart failure) or lung disease (such as asthma), or if you have a weakened immune system. Elderly people and pregnant women are also at risk for more serious infections. The most common problem of influenza is a lung infection (pneumonia). Sometimes, this problem can require emergency medical care and may be life threatening. SIGNS AND SYMPTOMS  Symptoms typically last 4 to 10 days and may include:  Fever.  Chills.  Headache, body aches, and muscle aches.  Sore throat.  Chest discomfort and cough.  Poor appetite.  Weakness or feeling tired.  Dizziness.  Nausea or vomiting. DIAGNOSIS  Diagnosis of influenza is often made based on your history and a physical exam. A nose or throat swab test can be done to confirm the diagnosis. TREATMENT  In mild cases, influenza goes away on its own. Treatment is directed at relieving symptoms. For more severe cases, your health care provider may prescribe antiviral medicines to shorten the sickness. Antibiotic medicines are not effective because the infection is caused by a virus, not by bacteria. HOME CARE  INSTRUCTIONS  Take medicines only as directed by your health care provider.  Use a cool mist humidifier to make breathing easier.  Get plenty of rest until your temperature returns to normal. This usually takes 3 to 4 days.  Drink enough fluid to keep your urine clear or pale yellow.  Cover yourmouth and nosewhen coughing or sneezing,and wash your handswellto prevent thevirusfrom spreading.  Stay homefromwork orschool untilthe fever is gonefor at least 22full day. PREVENTION  An annual influenza vaccination (flu shot) is the best way to avoid getting influenza. An annual flu shot is now routinely recommended for all adults in the [place] IF:  You experiencechest pain, yourcough worsens,or you producemore mucus.  Youhave nausea,vomiting, ordiarrhea.  Your fever returns or gets worse. SEEK IMMEDIATE MEDICAL CARE IF:  You havetrouble breathing, you become short of breath,or your skin ornails becomebluish.  You have severe painor stiffnessin the neck.  You develop a sudden headache, or pain in the face or ear.  You have nausea or vomiting that you cannot control. MAKE SURE YOU:   Understand these instructions.  Will watch your condition.  Will get help right away if you are not doing well or get worse.   This information is not intended to replace advice given to you by your health care provider. Make sure you discuss any questions you have with your health care provider.   Document Released: 02/07/2000 Document Revised: 03/02/2014 Document Reviewed: 05/11/2011 Elsevier Interactive Patient Education [employer].

## 2015-04-14 NOTE — Progress Notes (Signed)
[facility] [address] [place] [address] [phone]  Date:  04/14/2015   Name:  Aimee King   DOB:  1980/03/06   MRN:  [id number]  PCP:  No PCP Per Patient   Chief Complaint  Patient presents with  . Fever    with myalgia/ fatigue cough   . requesting lab    states she wants CBC done / per orthopedist her bone marrow looks abnormal     History of Present Illness:  Aimee King is a 35 y.o. female patient who presents to [facility] for chief complaint of body aches, chills and fever that started last night.  sxs began with an acute onset subjective fever and chills.  She checked and had a fever of 101.  She took an anti-pyretic that helped.  She had mild nausea.  She has developed a cough that causes pain her her chest.  She also has throat pain.  Ear pain is minimal.  She feels weak and fatigued.   She also is requesting to check her cbc as recommended by her orthopedist.  She states that her images looked distorted, according to her doctor and he advised her to do this.    Patient Active Problem List   Diagnosis Date Noted  . Stress incontinence 11/12/2014  . ADD (attention deficit disorder) 11/12/2014  . Back pain 10/07/2012  . Endometriosis 10/07/2012  . Foot pain, bilateral 10/07/2012  . Stomach pain 10/07/2012    Past Medical History  Diagnosis Date  . ADD (attention deficit disorder)   . Allergy   . Depression   . Anemia   . Attention deficit disorder (ADD)   . Endometriosis     Past Surgical History  Procedure Laterality Date  . Cesarean section  2004, 2008  . Cholecystectomy  2004  . Laparoscopy  2002    endometriosis  . Tubal ligation  2008  . Cholecystectomy    . Cesarean section    . Tubal ligation      Social History  Substance Use Topics  . Smoking status: Never Smoker   . Smokeless tobacco: None  . Alcohol Use: 0.0 oz/week    0 Standard drinks or equivalent per week     Comment: ocassionally    Family  History  Problem Relation Age of Onset  . Hypertension Mother   . Hypertension Father   . Diabetes Mother   . Heart disease Father   . Diabetes Father   . Diabetes Maternal Grandmother     Allergies  Allergen Reactions  . Codeine Nausea And Vomiting  . Codeine Nausea And Vomiting  . Latex Itching  . Latex     Feels like little cuts in her vagina.    Medication list has been reviewed and updated.  Current Outpatient Prescriptions on File Prior to Visit  Medication Sig Dispense Refill  . amphetamine-dextroamphetamine (ADDERALL XR) 20 MG 24 hr capsule Take 20 mg by mouth every morning.    Marland Kitchen levonorgestrel (MIRENA) 20 MCG/24HR IUD 1 each by Intrauterine route once.    Marland Kitchen PARoxetine (PAXIL) 10 MG tablet Take 10 mg by mouth daily.    . polyethylene glycol (MIRALAX / GLYCOLAX) packet Take 17 g by mouth daily.    . pseudoephedrine-guaifenesin (MUCINEX D) 60-600 MG 12 hr tablet Take 1 tablet by mouth every 12 (twelve) hours. 18 tablet 0   No current facility-administered medications on file prior to visit.    ROS ROS otherwise unremarkable unless  listed above.   Physical Examination: BP 128/76 mmHg  Pulse 138  Temp(Src) 100.3 F (37.9 C)  Resp 20  Ht 4\' 11"  (1.499 m)  Wt 182 lb (82.555 kg)  BMI 36.74 kg/m2  SpO2 96% Ideal Body Weight: Weight in (lb) to have BMI = 25: 123.5  Physical Exam  Constitutional: She is oriented to person, place, and time. She appears well-developed and well-nourished. No distress.  HENT:  Head: Normocephalic and atraumatic.  Right Ear: Tympanic membrane, external ear and ear canal normal.  Left Ear: Tympanic membrane, external ear and ear canal normal.  Nose: Mucosal edema and rhinorrhea present. Right sinus exhibits no maxillary sinus tenderness and no frontal sinus tenderness. Left sinus exhibits no maxillary sinus tenderness and no frontal sinus tenderness.  Mouth/Throat: No uvula swelling. No oropharyngeal exudate, posterior oropharyngeal  edema or posterior oropharyngeal erythema.  Eyes: Conjunctivae and EOM are normal. Pupils are equal, round, and reactive to light. Right eye exhibits no discharge. Left eye exhibits no discharge.  Cardiovascular: Normal rate and regular rhythm.  Exam reveals no gallop, no distant heart sounds and no friction rub.   No murmur heard. Pulmonary/Chest: Effort normal. No respiratory distress. She has no decreased breath sounds. She has no wheezes. She has no rhonchi.  Lymphadenopathy:       Head (right side): No submandibular, no tonsillar, no preauricular and no posterior auricular adenopathy present.       Head (left side): No submandibular, no tonsillar, no preauricular and no posterior auricular adenopathy present.    She has no cervical adenopathy.    She has no axillary adenopathy.  Neurological: She is alert and oriented to person, place, and time.  Skin: Skin is warm and dry. No rash noted. She is not diaphoretic.  Psychiatric: She has a normal mood and affect. Her behavior is normal.   Results for orders placed or performed in visit on 04/14/15  POCT CBC  Result Value Ref Range   WBC 6.7 4.6 - 10.2 K/uL   Lymph, poc 0.5 (A) 0.6 - 3.4   POC LYMPH PERCENT 7.8 (A) 10 - 50 %L   MID (cbc) 0.5 0 - 0.9   POC MID % 6.9 0 - 12 %M   POC Granulocyte 5.7 2 - 6.9   Granulocyte percent 85.3 (A) 37 - 80 %G   RBC 4.65 4.04 - 5.48 M/uL   Hemoglobin 13.7 12.2 - 16.2 g/dL   HCT, POC 39.9 37.7 - 47.9 %   MCV 85.9 80 - 97 fL   MCH, POC 29.4 27 - 31.2 pg   MCHC 34.2 31.8 - 35.4 g/dL   RDW, POC 13.3 %   Platelet Count, POC 176 142 - 424 K/uL   MPV 7.7 0 - 99.8 fL  POCT Influenza A/B  Result Value Ref Range   Influenza A, POC Positive (A) Negative   Influenza B, POC Negative Negative     Assessment and Plan: Aimee King is a 35 y.o. female who is here today for body aches, fever, congestion, and cough.  Patient's blood appears normal, will place differential but this does not appear  concerning.   Flu-like symptoms - Plan: POCT Influenza A/B, ibuprofen (ADVIL,MOTRIN) tablet 200 mg, oseltamivir (TAMIFLU) 75 MG capsule, benzonatate (TESSALON) 100 MG capsule  Abnormal MRI - Plan: POCT CBC, CBC with Differential/Platelet  Ivar Drape, PA-C [facility] 2/22/20176:40 PM

## 2015-06-20 ENCOUNTER — Ambulatory Visit (INDEPENDENT_AMBULATORY_CARE_PROVIDER_SITE_OTHER): Payer: BC Managed Care – PPO | Admitting: Family Medicine

## 2015-06-20 ENCOUNTER — Ambulatory Visit (INDEPENDENT_AMBULATORY_CARE_PROVIDER_SITE_OTHER): Payer: BC Managed Care – PPO

## 2015-06-20 VITALS — BP 102/74 | HR 90 | Temp 97.3°F | Resp 22

## 2015-06-20 DIAGNOSIS — R0602 Shortness of breath: Secondary | ICD-10-CM | POA: Diagnosis not present

## 2015-06-20 DIAGNOSIS — M546 Pain in thoracic spine: Secondary | ICD-10-CM | POA: Diagnosis not present

## 2015-06-20 MED ORDER — CYCLOBENZAPRINE HCL 10 MG PO TABS: 10.0000 mg | ORAL_TABLET | Freq: Three times a day (TID) | ORAL | Status: DC | PRN

## 2015-06-20 MED ORDER — KETOROLAC TROMETHAMINE 60 MG/2ML IM SOLN
60.0000 mg | Freq: Once | INTRAMUSCULAR | Status: AC
  Administered 2015-06-20: 60 mg via INTRAMUSCULAR

## 2015-06-20 NOTE — Progress Notes (Signed)
Subjective:    Patient ID: Aimee King, female    DOB: 10-07-1980, 35 y.o.   MRN: [id number]  HPI This is a 35 yo female who is accompanied by her 74 yo daughter. She presents this morning emergently with right sided back and arm pain and SOB. She reports the SOB is related to her pain. She noticed that it started yesterday but she was able to do her normal activities. She had worked out with her Physiological scientist and yesterday was "arm day." She did not feel particular discomfort during exercise. The pain progressed through the night and she was unable to sleep or get comfortable.  She has never had pain like this before. Took motrin 200 mg x 2 yesterday with no relief. Did get some relief in the shower. Only change is that she started working out about 2 weeks ago.   She does not have any known CAD or breathing problems. She has a strong family history of heart disease and diabetes.   Past Medical History  Diagnosis Date  . ADD (attention deficit disorder)   . Allergy   . Depression   . Anemia   . Attention deficit disorder (ADD)   . Endometriosis    Past Surgical History  Procedure Laterality Date  . Cesarean section  2004, 2008  . Cholecystectomy  2004  . Laparoscopy  2002    endometriosis  . Tubal ligation  2008  . Cholecystectomy    . Cesarean section    . Tubal ligation     Family History  Problem Relation Age of Onset  . Hypertension Mother   . Hypertension Father   . Diabetes Mother   . Heart disease Father   . Diabetes Father   . Diabetes Maternal Grandmother    Social History  Substance Use Topics  . Smoking status: Never Smoker   . Smokeless tobacco: Not on file  . Alcohol Use: 0.0 oz/week    0 Standard drinks or equivalent per week     Comment: ocassionally     Review of Systems No fever or chills,+ SOB due to pain, no wheeze or cough, + pain right side of back, no nausea or vomiting, no diaphoresis, no palpitations, no edema    Objective:   Physical Exam  Constitutional: She is oriented to person, place, and time. She appears well-developed and well-nourished.  Intermittently appears mildly distressed with movement.   HENT:  Head: Normocephalic and atraumatic.  Eyes: Conjunctivae are normal.  Neck: Normal range of motion. Neck supple.  Cardiovascular: Normal rate, regular rhythm and normal heart sounds.   Pulmonary/Chest: Effort normal and breath sounds normal.  Abdominal: Soft. She exhibits no distension. There is no tenderness. There is no rebound and no guarding.  Musculoskeletal: Normal range of motion. She exhibits no edema or tenderness.  Unable to reproduce pain with palpation but am able to reproduce it with active and passive ROM of right arm and flexion/extension of spine.   Neurological: She is alert and oriented to person, place, and time.  Skin: Skin is warm and dry.  Psychiatric: She has a normal mood and affect. Her behavior is normal. Judgment and thought content normal.  Vitals reviewed.  BP 102/74 mmHg  Pulse 90  Temp(Src) 97.3 F (36.3 C)  Resp 22  SpO2 99%  EKG- NSR Dg Chest 2 View  06/20/2015  CLINICAL DATA:  Chest pain.  Shortness of breath. EXAM: CHEST  2 VIEW COMPARISON:  No recent prior. FINDINGS: Mediastinum  hilar structures are normal. Mild right lower lobe infiltrate cannot be excluded. No pleural effusion or pneumothorax. No acute bony abnormality . IMPRESSION: Mild right lower lobe infiltrate cannot be excluded . Electronically Signed   By: Marcello Moores  Register   On: 06/20/2015 10:57  Patient given water and Toradol 60 mg IM with significant improvement in pain and decreased RR.     Assessment & Plan:  - Discussed with Dr. Linna Darner 1. SOB (shortness of breath) - EKG 12-Lead - DG Chest 2 View; Future  2. Right-sided thoracic back pain - suspect musculoskeletal origin, reviewed RTC precautions - DG Chest 2 View; Future - ketorolac (TORADOL) injection 60 mg; Inject 2 mLs (60 mg total) into the  muscle once. - cyclobenzaprine (FLEXERIL) 10 MG tablet; Take 1 tablet (10 mg total) by mouth 3 (three) times daily as needed for muscle spasms.  Dispense: 30 tablet; Refill: 0  Clarene Reamer, FNP-BC  [facility]  06/20/2015 12:43 PM

## 2015-06-20 NOTE — Patient Instructions (Addendum)
Take ibuprofen 400-600 mg every 8 to 12 hours for pain   If you are getting worse please come back in to see [place] or go to the ER      IF you received an x-ray today, you will receive an invoice from [facility]. Please contact [facility] at [phone] with questions or concerns regarding your invoice.   IF you received labwork today, you will receive an invoice from [employer]. Please contact [facility] at [phone] with questions or concerns regarding your invoice.   Our billing staff will not be able to assist you with questions regarding bills from these companies.  You will be contacted with the lab results as soon as they are available. The fastest way to get your results is to activate your My Chart account. Instructions are located on the last page of this paperwork. If you have not heard from [place] regarding the results in 2 weeks, please contact this office.

## 2015-06-20 NOTE — Progress Notes (Signed)
Patient history and exam reviewed with Tor Netters, FNP. EKG was reviewed and appears normal. Assessment and treatment plan agreed upon.  Posey Boyer M.D.

## 2015-11-22 ENCOUNTER — Ambulatory Visit (INDEPENDENT_AMBULATORY_CARE_PROVIDER_SITE_OTHER): Payer: BC Managed Care – PPO | Admitting: Physician Assistant

## 2015-11-22 ENCOUNTER — Ambulatory Visit (INDEPENDENT_AMBULATORY_CARE_PROVIDER_SITE_OTHER): Payer: BC Managed Care – PPO

## 2015-11-22 VITALS — BP 122/86 | HR 71 | Temp 98.6°F | Resp 16 | Ht 59.0 in | Wt 185.6 lb

## 2015-11-22 DIAGNOSIS — M79601 Pain in right arm: Secondary | ICD-10-CM | POA: Diagnosis not present

## 2015-11-22 LAB — POCT CBC
GRANULOCYTE PERCENT: 58.4 % (ref 37–80)
HCT, POC: 38.9 % (ref 37.7–47.9)
Hemoglobin: 13.8 g/dL (ref 12.2–16.2)
LYMPH, POC: 2.5 (ref 0.6–3.4)
MCH, POC: 30 pg (ref 27–31.2)
MCHC: 35.4 g/dL (ref 31.8–35.4)
MCV: 84.5 fL (ref 80–97)
MID (CBC): 0.4 (ref 0–0.9)
MPV: 8.4 fL (ref 0–99.8)
PLATELET COUNT, POC: 245 10*3/uL (ref 142–424)
POC GRANULOCYTE: 4.2 (ref 2–6.9)
POC LYMPH %: 35.4 % (ref 10–50)
POC MID %: 6.2 % (ref 0–12)
RBC: 4.6 M/uL (ref 4.04–5.48)
RDW, POC: 13.3 %
WBC: 7.2 10*3/uL (ref 4.6–10.2)

## 2015-11-22 LAB — POCT SEDIMENTATION RATE: POCT SED RATE: 40 mm/h — AB (ref 0–22)

## 2015-11-22 MED ORDER — NAPROXEN 500 MG PO TABS: 500.0000 mg | ORAL_TABLET | Freq: Two times a day (BID) | ORAL | 0 refills | Status: DC

## 2015-11-22 NOTE — Patient Instructions (Addendum)
Please ice the arm three times per day for 15 minutes.   Please take the naprosyn as prescribed.  Take with food.  I would like you to follow up on Monday, unless this worsens tomorrow.       IF you received an x-ray today, you will receive an invoice from [facility]. Please contact [facility] at [phone] with questions or concerns regarding your invoice.   IF you received labwork today, you will receive an invoice from [employer]. Please contact [facility] at [phone] with questions or concerns regarding your invoice.   Our billing staff will not be able to assist you with questions regarding bills from these companies.  You will be contacted with the lab results as soon as they are available. The fastest way to get your results is to activate your My Chart account. Instructions are located on the last page of this paperwork. If you have not heard from [place] regarding the results in 2 weeks, please contact this office.

## 2015-11-22 NOTE — Progress Notes (Signed)
[facility] [address] [place] [address] [phone]  Date:  11/22/2015   Name:  Aimee King   DOB:  [birth year]   MRN:  [id number]  PCP:  No PCP Per Patient    History of Present Illness:  Aimee King is a 35 y.o. female patient who presents to [facility] for left arm pain. The pain initially started 4 days ago with with subtle throbbing at her elbow.  She was able to work, but last night the pain suddenly worsened in her arm to what she says brings her to tears.  She has shooting pain down to her finger tips.  She is not able to grip anything.  She states the pain is aggravated by having her arm down, so she cradles her arm to her breast.  She has not noticed swelling to the arm or warmth.  She denies any trauma or fall.  Pain started in the morning initially but did not wake her from sleep and she does not recall waking with the pain.  She can not say that she slept on it wrong.  She has no neck or shoulder pain.  No hx of rheumatoid issues.  n Aggravated by lowerin the arm.  No chest pain, palpitations, sob, or dizziness.    Patient Active Problem List   Diagnosis Date Noted  . Stress incontinence 11/12/2014  . ADD (attention deficit disorder) 11/12/2014  . Back pain 10/07/2012  . Endometriosis 10/07/2012  . Foot pain, bilateral 10/07/2012  . Stomach pain 10/07/2012    Past Medical History:  Diagnosis Date  . ADD (attention deficit disorder)   . Allergy   . Anemia   . Attention deficit disorder (ADD)   . Depression   . Endometriosis     Past Surgical History:  Procedure Laterality Date  . CESAREAN SECTION  2004, 2008  . CESAREAN SECTION    . CHOLECYSTECTOMY  2004  . CHOLECYSTECTOMY    . LAPAROSCOPY  2002   endometriosis  . TUBAL LIGATION  2008  . TUBAL LIGATION      Social History  Substance Use Topics  . Smoking status: Never Smoker  . Smokeless tobacco: Never Used  . Alcohol use 0.0 oz/week     Comment: ocassionally     Family History  Problem Relation Age of Onset  . Hypertension Mother   . Diabetes Mother   . Hypertension Father   . Heart disease Father   . Diabetes Father   . Diabetes Maternal Grandmother     Allergies  Allergen Reactions  . Codeine Nausea And Vomiting  . Codeine Nausea And Vomiting  . Latex Itching  . Latex     Feels like little cuts in her vagina.    Medication list has been reviewed and updated.  Current Outpatient Prescriptions on File Prior to Visit  Medication Sig Dispense Refill  . amphetamine-dextroamphetamine (ADDERALL XR) 20 MG 24 hr capsule Take 20 mg by mouth every morning.    . cyclobenzaprine (FLEXERIL) 10 MG tablet Take 1 tablet (10 mg total) by mouth 3 (three) times daily as needed for muscle spasms. 30 tablet 0  . levonorgestrel (MIRENA) 20 MCG/24HR IUD 1 each by Intrauterine route once.    . polyethylene glycol (MIRALAX / GLYCOLAX) packet Take 17 g by mouth daily.     No current facility-administered medications on file prior to visit.     ROS ROS otherwise unremarkable unless listed above.   Physical Examination: BP  118/72 (BP Location: Left Arm, Patient Position: Sitting, Cuff Size: Large)   Pulse 87   Temp 98.6 F (37 C) (Oral)   Resp 16   Ht 4\' 11"  (1.499 m)   Wt 185 lb 9.6 oz (84.2 kg)   SpO2 98%   BMI 37.49 kg/m  Ideal Body Weight: Weight in (lb) to have BMI = 25: 123.5  Physical Exam  Constitutional: She is oriented to person, place, and time. She appears well-developed and well-nourished. No distress.  HENT:  Head: Normocephalic and atraumatic.  Right Ear: External ear normal.  Left Ear: External ear normal.  Eyes: Conjunctivae and EOM are normal. Pupils are equal, round, and reactive to light.  Cardiovascular: Normal rate.   Pulmonary/Chest: Effort normal. No respiratory distress.  Musculoskeletal:       Right shoulder: She exhibits tenderness (mild tenderness along the posterior shoulder though does not inc). She exhibits  normal range of motion.  Neurological: She is alert and oriented to person, place, and time.  Skin: She is not diaphoretic.  Psychiatric: She has a normal mood and affect. Her behavior is normal.     Assessment and Plan: Aimee King is a 35 y.o. female who is here today for cc of right arm pain. This appears to be inflamed at this time. I'm advising Naprosyn twice a day at this time. Also advised ice and heat at this time. She will return in 48 hours if no improvement of her symptoms. This could be possible gout as well. Possible carpal tunnel or inflammation from overall use  Pain of right upper extremity - Plan: POCT CBC, POCT SEDIMENTATION RATE, DG Forearm Right, DG Wrist Complete Right, naproxen (NAPROSYN) 500 MG tablet  Ivar Drape, PA-C [facility] 11/22/2015 8:30 AM

## 2016-02-10 ENCOUNTER — Ambulatory Visit (INDEPENDENT_AMBULATORY_CARE_PROVIDER_SITE_OTHER): Payer: BC Managed Care – PPO | Admitting: Family Medicine

## 2016-02-10 VITALS — BP 112/68 | HR 95 | Temp 98.3°F | Resp 18 | Ht 59.0 in | Wt 185.0 lb

## 2016-02-10 DIAGNOSIS — J069 Acute upper respiratory infection, unspecified: Secondary | ICD-10-CM

## 2016-02-10 DIAGNOSIS — R5382 Chronic fatigue, unspecified: Secondary | ICD-10-CM

## 2016-02-10 MED ORDER — FLUTICASONE PROPIONATE 50 MCG/ACT NA SUSP: 2.0000 | Freq: Every day | NASAL | 6 refills | Status: DC

## 2016-02-10 MED ORDER — AZITHROMYCIN 250 MG PO TABS: ORAL_TABLET | ORAL | 0 refills | Status: DC

## 2016-02-10 NOTE — Progress Notes (Signed)
Chief Complaint  Patient presents with  . chest congestion  . Ear Fullness    left   . Fatigue  . Chest Pain    HPI   Acute URI Pt is on day 8 of an upper resp infection She is taking mucinex d, allegra, robitussin and tylenol Her sputum is now clearing up and is no longer dark yellow. She reports that her ears feel clogged and she has chest congestion, with sore throat She denies current fevers or chills She reports that if she starts laughing or taking a deep breath she gets a coughing spell that makes her chest hurt and her throat.   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  She states that no matter how much rest she gets she has been continually fatigued.  She states that she does not drink caffeine   Past Medical History:  Diagnosis Date  . ADD (attention deficit disorder)   . Allergy   . Anemia   . Attention deficit disorder (ADD)   . Depression   . Endometriosis     Current Outpatient Prescriptions  Medication Sig Dispense Refill  . amphetamine-dextroamphetamine (ADDERALL XR) 20 MG 24 hr capsule Take 20 mg by mouth every morning.    Marland Kitchen levonorgestrel (MIRENA) 20 MCG/24HR IUD 1 each by Intrauterine route once.    . polyethylene glycol (MIRALAX / GLYCOLAX) packet Take 17 g by mouth daily.    Marland Kitchen azithromycin (ZITHROMAX Z-PAK) 250 MG tablet Take 2 tablets on day 1, take one tablet each day after 6 each 0  . cyclobenzaprine (FLEXERIL) 10 MG tablet Take 1 tablet (10 mg total) by mouth 3 (three) times daily as needed for muscle spasms. (Patient not taking: Reported on 02/10/2016) 30 tablet 0  . fluticasone (FLONASE) 50 MCG/ACT nasal spray Place 2 sprays into both nostrils daily. 16 g 6  . Vitamin D, Ergocalciferol, (DRISDOL) 50000 units CAPS capsule Take 1 capsule (50,000 Units total) by mouth every 7 (seven) days. 12  capsule 0   No current facility-administered medications for this visit.     Allergies:  Allergies  Allergen Reactions  . Codeine Nausea And Vomiting  . Codeine Nausea And Vomiting  . Latex Itching  . Latex     Feels like little cuts in her vagina.    Past Surgical History:  Procedure Laterality Date  . CESAREAN SECTION  2004, 2008  . CESAREAN SECTION    . CHOLECYSTECTOMY  2004  . CHOLECYSTECTOMY    . LAPAROSCOPY  2002   endometriosis  . TUBAL LIGATION  2008  . TUBAL LIGATION      Social History   Social History  . Marital status: Married    Spouse name: N/A  . Number of children: N/A  . Years of education: N/A   Social History Main Topics  . Smoking status: Never Smoker  . Smokeless tobacco: Never Used  . Alcohol use 0.0 oz/week     Comment: ocassionally  . Drug use: No  . Sexual activity: Yes    Partners: Male    Birth control/ protection: IUD   Other Topics Concern  . None   Social History Narrative   ** Merged History Encounter **        Review of Systems  Constitutional: Negative for chills, fever and weight loss.  HENT: Positive for sore throat.  See hpi  Respiratory: Positive for cough and sputum production. Negative for hemoptysis and shortness of breath.   Cardiovascular: Negative for orthopnea, leg swelling and PND.  Gastrointestinal: Negative for abdominal pain, constipation, diarrhea, nausea and vomiting.  Genitourinary: Negative for dysuria and urgency.  Musculoskeletal: Negative for joint pain and myalgias.  Skin: Negative for itching and rash.  Neurological: Negative for dizziness, tingling, tremors and headaches.    Objective: Vitals:   02/10/16 1538  BP: 112/68  Pulse: 95  Resp: 18  Temp: 98.3 F (36.8 C)  TempSrc: Oral  SpO2: 97%  Weight: 185 lb (83.9 kg)  Height: 4\' 11"  (1.499 m)    Physical Exam  Constitutional: She is oriented to person, place, and time. She appears well-developed and well-nourished.  HENT:    Head: Normocephalic and atraumatic.  Eyes: Conjunctivae and EOM are normal. Right eye exhibits no discharge. Left eye exhibits no discharge.  Cardiovascular: Normal rate, regular rhythm and normal heart sounds.   No murmur heard. Pulmonary/Chest: Effort normal and breath sounds normal. No respiratory distress. She has no wheezes. She has no rales.  Abdominal: Soft. Bowel sounds are normal. She exhibits no distension. There is no tenderness.  Musculoskeletal: Normal range of motion. She exhibits no edema.  Neurological: She is alert and oriented to person, place, and time.  Skin: Skin is warm.  Psychiatric: She has a normal mood and affect. Her behavior is normal. Judgment and thought content normal.    Assessment and Plan Aimee King was seen today for chest congestion, ear fullness, fatigue and chest pain.  Diagnoses and all orders for this visit:  Acute URI- supportive care, antibiotic if her symptoms worsen Nasal steroids -     azithromycin (ZITHROMAX Z-PAK) 250 MG tablet; Take 2 tablets on day 1, take one tablet each day after -     fluticasone (FLONASE) 50 MCG/ACT nasal spray; Place 2 sprays into both nostrils daily.  Chronic fatigue- will assess for other causes of fatigue Will check to see if autoimmune disorder is also a possibility -     Vitamin B12 -     VITAMIN D 25 Hydroxy (Vit-D Deficiency, Fractures) -     ANA w/Reflex if Positive      Aimee King

## 2016-02-10 NOTE — Patient Instructions (Addendum)
     IF you received an x-ray today, you will receive an invoice from [facility]. Please contact [facility] at [phone] with questions or concerns regarding your invoice.   IF you received labwork today, you will receive an invoice from [facility]. Please contact [facility] at [phone] with questions or concerns regarding your invoice.   Our billing staff will not be able to assist you with questions regarding bills from these companies.  You will be contacted with the lab results as soon as they are available. The fastest way to get your results is to activate your My Chart account. Instructions are located on the last page of this paperwork. If you have not heard from [place] regarding the results in 2 weeks, please contact this office.     Fatigue Introduction Fatigue is feeling tired all of the time, a lack of energy, or a lack of motivation. Occasional or mild fatigue is often a normal response to activity or life in general. However, long-lasting (chronic) or extreme fatigue may indicate an underlying medical condition. Follow these instructions at home: Watch your fatigue for any changes. The following actions may help to lessen any discomfort you are feeling:  Talk to your health care provider about how much sleep you need each night. Try to get the required amount every night.  Take medicines only as directed by your health care provider.  Eat a healthy and nutritious diet. Ask your health care provider if you need help changing your diet.  Drink enough fluid to keep your urine clear or pale yellow.  Practice ways of relaxing, such as yoga, meditation, massage therapy, or acupuncture.  Exercise regularly.  Change situations that cause you stress. Try to keep your work and personal routine reasonable.  Do not abuse illegal drugs.  Limit alcohol intake to no more than 1 drink per day for nonpregnant women and 2 drinks per day for men. One drink equals 12  ounces of beer, 5 ounces of wine, or 1 ounces of hard liquor.  Take a multivitamin, if directed by your health care provider. Contact a health care provider if:  Your fatigue does not get better.  You have a fever.  You have unintentional weight loss or gain.  You have headaches.  You have difficulty:  Falling asleep.  Sleeping throughout the night.  You feel angry, guilty, anxious, or sad.  You are unable to have a bowel movement (constipation).  You skin is dry.  Your legs or another part of your body is swollen. Get help right away if:  You feel confused.  Your vision is blurry.  You feel faint or pass out.  You have a severe headache.  You have severe abdominal, pelvic, or back pain.  You have chest pain, shortness of breath, or an irregular or fast heartbeat.  You are unable to urinate or you urinate less than normal.  You develop abnormal bleeding, such as bleeding from the rectum, vagina, nose, lungs, or nipples.  You vomit blood.  You have thoughts about harming yourself or committing suicide.  You are worried that you might harm someone else. This information is not intended to replace advice given to you by your health care provider. Make sure you discuss any questions you have with your health care provider. Document Released: 12/07/2006 Document Revised: 07/18/2015 Document Reviewed: 06/13/2013  2017 [employer]vier

## 2016-02-11 LAB — ANA W/REFLEX IF POSITIVE: Anti Nuclear Antibody(ANA): NEGATIVE

## 2016-02-11 LAB — VITAMIN D 25 HYDROXY (VIT D DEFICIENCY, FRACTURES): Vit D, 25-Hydroxy: 18.5 ng/mL — ABNORMAL LOW (ref 30.0–100.0)

## 2016-02-11 LAB — VITAMIN B12: Vitamin B-12: 765 pg/mL (ref 232–1245)

## 2016-02-12 ENCOUNTER — Other Ambulatory Visit: Payer: Self-pay | Admitting: Family Medicine

## 2016-02-12 MED ORDER — VITAMIN D (ERGOCALCIFEROL) 1.25 MG (50000 UNIT) PO CAPS: 50000.0000 [IU] | ORAL_CAPSULE | ORAL | 0 refills | Status: DC

## 2016-02-18 ENCOUNTER — Ambulatory Visit (INDEPENDENT_AMBULATORY_CARE_PROVIDER_SITE_OTHER): Payer: BC Managed Care – PPO | Admitting: Family Medicine

## 2016-02-18 VITALS — BP 118/64 | HR 84 | Temp 97.8°F | Resp 16 | Ht 59.0 in | Wt 188.0 lb

## 2016-02-18 DIAGNOSIS — F900 Attention-deficit hyperactivity disorder, predominantly inattentive type: Secondary | ICD-10-CM | POA: Diagnosis not present

## 2016-02-18 DIAGNOSIS — J069 Acute upper respiratory infection, unspecified: Secondary | ICD-10-CM | POA: Diagnosis not present

## 2016-02-18 DIAGNOSIS — Z23 Encounter for immunization: Secondary | ICD-10-CM | POA: Diagnosis not present

## 2016-02-18 DIAGNOSIS — H6121 Impacted cerumen, right ear: Secondary | ICD-10-CM | POA: Diagnosis not present

## 2016-02-18 DIAGNOSIS — E559 Vitamin D deficiency, unspecified: Secondary | ICD-10-CM | POA: Diagnosis not present

## 2016-02-18 MED ORDER — AMPHET-DEXTROAMPHET 3-BEAD ER 12.5 MG PO CP24: 12.5000 | ORAL_CAPSULE | Freq: Every day | ORAL | 0 refills | Status: DC

## 2016-02-18 MED ORDER — CARBAMIDE PEROXIDE 6.5 % OT SOLN: 5.0000 [drp] | Freq: Two times a day (BID) | OTIC | 0 refills | Status: DC

## 2016-02-18 MED ORDER — BENZONATATE 200 MG PO CAPS: 200.0000 mg | ORAL_CAPSULE | Freq: Two times a day (BID) | ORAL | 3 refills | Status: DC | PRN

## 2016-02-18 NOTE — Patient Instructions (Addendum)
Mydayis - trouble sleeping, decreased appetite, dry mouth, increased heart rate, anxiety, nausea, irritability, weight loss    IF you received an x-ray today, you will receive an invoice from [facility]. Please contact [facility] at [phone] with questions or concerns regarding your invoice.   IF you received labwork today, you will receive an invoice from [facility]. Please contact [facility] at [phone] with questions or concerns regarding your invoice.   Our billing staff will not be able to assist you with questions regarding bills from these companies.  You will be contacted with the lab results as soon as they are available. The fastest way to get your results is to activate your My Chart account. Instructions are located on the last page of this paperwork. If you have not heard from [place] regarding the results in 2 weeks, please contact this office.

## 2016-02-18 NOTE — Progress Notes (Signed)
Chief Complaint  Patient presents with  . Follow-up    Labwork, ADD and URI    HPI   Vit D def Pt reports that she started taking vitamin D during October so she might have been even lower. She reports that she started taking vitamin d on her own due to her bone pains. She started taking her vitamin D ergocalciferol 50,000 units weekly.  ADD She reports that she is no longer going to Psychiatry for her ADD due to the distance and the copays.  She would like to Adderall XR She is on 20mg  extended release She reports that at 2pm she has low energy and has difficulty focusing. 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 She tried the XR and immediate release for the afternoon but could not remember to take the immediate release.   URI She reports that her right ear hurts and both are still clogged. She reports that she still has the azithromycin which she did not take her antibiotic due to the lack of fevers or changes to sputum.   Past Medical History:  Diagnosis Date  . ADD (attention deficit disorder)   . Allergy   . Anemia   . Attention deficit disorder (ADD)   . Depression   . Endometriosis     Current Outpatient Prescriptions  Medication Sig Dispense Refill  . cyclobenzaprine (FLEXERIL) 10 MG tablet Take 1 tablet (10 mg total) by mouth 3 (three) times daily as needed for muscle spasms. 30 tablet 0  . fluticasone (FLONASE) 50 MCG/ACT nasal spray Place 2 sprays into both nostrils daily. 16 g 6  . levonorgestrel (MIRENA) 20 MCG/24HR IUD 1 each by Intrauterine route once.    . polyethylene glycol (MIRALAX / GLYCOLAX) packet Take 17 g by mouth daily.    . Vitamin D, Ergocalciferol, (DRISDOL) 50000 units CAPS capsule Take 1 capsule (50,000 Units total) by mouth every 7 (seven) days. 12 capsule 0  . Amphet-Dextroamphet 3-Bead ER (MYDAYIS) 12.5 MG CP24 Take 12.5 capsules by mouth daily. 30 capsule 0  . azithromycin (ZITHROMAX Z-PAK) 250 MG tablet  Take 2 tablets on day 1, take one tablet each day after (Patient not taking: Reported on 02/18/2016) 6 each 0  . benzonatate (TESSALON) 200 MG capsule Take 1 capsule (200 mg total) by mouth 2 (two) times daily as needed for cough. 30 capsule 3  . carbamide peroxide (DEBROX) 6.5 % otic solution Place 5 drops into the right ear 2 (two) times daily. 15 mL 0   No current facility-administered medications for this visit.     Allergies:  Allergies  Allergen Reactions  . Codeine Nausea And Vomiting  . Codeine Nausea And Vomiting  . Latex Itching  . Latex     Feels like little cuts in her vagina.    Past Surgical History:  Procedure Laterality Date  . CESAREAN SECTION  2004, 2008  . CESAREAN SECTION    . CHOLECYSTECTOMY  2004  . CHOLECYSTECTOMY    . LAPAROSCOPY  2002   endometriosis  . TUBAL LIGATION  2008  . TUBAL LIGATION      Social History   Social History  . Marital status: Married    Spouse name: N/A  . Number of children: N/A  . Years of education: N/A   Social History Main Topics  . Smoking status: Never Smoker  . Smokeless tobacco: Never Used  . Alcohol use 0.0 oz/week     Comment: ocassionally  .  Drug use: No  . Sexual activity: Yes    Partners: Male    Birth control/ protection: IUD   Other Topics Concern  . None   Social History Narrative   ** Merged History Encounter **        Review of Systems  Constitutional: Negative for chills and fever.  HENT: Positive for ear pain. Negative for congestion, ear discharge, sinus pain, sore throat and tinnitus.   Respiratory: Positive for cough. Negative for shortness of breath, wheezing and stridor.   Cardiovascular: Negative for chest pain and palpitations.  Skin: Negative for itching and rash.  Neurological: Negative for dizziness, tingling and headaches.    Objective: Vitals:   02/18/16 0921  BP: 118/64  Pulse: 84  Resp: 16  Temp: 97.8 F (36.6 C)  TempSrc: Oral  SpO2: 98%  Weight: 188 lb (85.3 kg)   Height: 4\' 11"  (1.499 m)    Physical Exam General: alert, oriented, in NAD Head: normocephalic, atraumatic, no sinus tenderness Eyes: EOM intact, no scleral icterus or conjunctival injection Ears: TM clear on left, TM obscured on the right Throat: no pharyngeal exudate or erythema Lymph: no posterior auricular, submental or cervical lymph adenopathy Heart: normal rate, normal sinus rhythm, no murmurs Lungs: clear to auscultation bilaterally, no wheezing   Assessment and Plan Aimee King was seen today for follow-up.  Diagnoses and all orders for this visit:  Need for prophylactic vaccination and inoculation against influenza -     Flu Vaccine QUAD 36+ mos IM  Attention deficit hyperactivity disorder (ADHD), predominantly inattentive type- discussed that she may be better off with Mydayis and start the lowest dose of mydayis Discussed that she should monitor effectiveness She should call for refill in one month and follow up in 3 months -     Amphet-Dextroamphet 3-Bead ER (MYDAYIS) 12.5 MG CP24; Take 12.5 capsules by mouth daily.  Acute URI- discussed cough suppressants -     benzonatate (TESSALON) 200 MG capsule; Take 1 capsule (200 mg total) by mouth 2 (two) times daily as needed for cough.  Vitamin D deficiency- discussed vit d and plan to recheck in 3 months  Impacted cerumen of right ear- discussed that she should try to loosen wax -     carbamide peroxide (DEBROX) 6.5 % otic solution; Place 5 drops into the right ear 2 (two) times daily.  Other orders      [place]

## 2016-03-25 ENCOUNTER — Ambulatory Visit (INDEPENDENT_AMBULATORY_CARE_PROVIDER_SITE_OTHER): Payer: BC Managed Care – PPO | Admitting: Family Medicine

## 2016-03-25 VITALS — BP 132/84 | HR 78 | Temp 98.5°F | Resp 16 | Ht 59.0 in | Wt 186.4 lb

## 2016-03-25 DIAGNOSIS — J111 Influenza due to unidentified influenza virus with other respiratory manifestations: Secondary | ICD-10-CM

## 2016-03-25 MED ORDER — AMPHET-DEXTROAMPHET 3-BEAD ER 12.5 MG PO CP24: 12.5000 | ORAL_CAPSULE | Freq: Every day | ORAL | 0 refills | Status: DC

## 2016-03-25 MED ORDER — OSELTAMIVIR PHOSPHATE 75 MG PO CAPS: 75.0000 mg | ORAL_CAPSULE | Freq: Two times a day (BID) | ORAL | 0 refills | Status: AC

## 2016-03-25 NOTE — Patient Instructions (Addendum)
Influenza, Adult Influenza, more commonly known as "the flu," is a viral infection that primarily affects the respiratory tract. The respiratory tract includes organs that help you breathe, such as the lungs, nose, and throat. The flu causes many common cold symptoms, as well as a high fever and body aches. The flu spreads easily from person to person (is contagious). Getting a flu shot (influenza vaccination) every year is the best way to prevent influenza. What are the causes? Influenza is caused by a virus. You can catch the virus by:  Breathing in droplets from an infected person's cough or sneeze.  Touching something that was recently contaminated with the virus and then touching your mouth, nose, or eyes.  What increases the risk? The following factors may make you more likely to get the flu:  Not cleaning your hands frequently with soap and water or alcohol-based hand sanitizer.  Having close contact with many people during cold and flu season.  Touching your mouth, eyes, or nose without washing or sanitizing your hands first.  Not drinking enough fluids or not eating a healthy diet.  Not getting enough sleep or exercise.  Being under a high amount of stress.  Not getting a yearly (annual) flu shot.  You may be at a higher risk of complications from the flu, such as a severe lung infection (pneumonia), if you:  Are over the age of 65.  Are pregnant.  Have a weakened disease-fighting system (immune system). You may have a weakened immune system if you: ? Have HIV or AIDS. ? Are undergoing chemotherapy. ? Aretaking medicines that reduce the activity of (suppress) the immune system.  Have a long-term (chronic) illness, such as heart disease, kidney disease, diabetes, or lung disease.  Have a liver disorder.  Are obese.  Have anemia.  What are the signs or symptoms? Symptoms of this condition typically last 4-10 days and may  include:  Fever.  Chills.  Headache, body aches, or muscle aches.  Sore throat.  Cough.  Runny or congested nose.  Chest discomfort and cough.  Poor appetite.  Weakness or tiredness (fatigue).  Dizziness.  Nausea or vomiting.  How is this diagnosed? This condition may be diagnosed based on your medical history and a physical exam. Your health care provider may do a nose or throat swab test to confirm the diagnosis. How is this treated? If influenza is detected early, you can be treated with antiviral medicine that can reduce the length of your illness and the severity of your symptoms. This medicine may be given by mouth (orally) or through an IV tube that is inserted in one of your veins. The goal of treatment is to relieve symptoms by taking care of yourself at home. This may include taking over-the-counter medicines, drinking plenty of fluids, and adding humidity to the air in your home. In some cases, influenza goes away on its own. Severe influenza or complications from influenza may be treated in a hospital. Follow these instructions at home:  Take over-the-counter and prescription medicines only as told by your health care provider.  Use a cool mist humidifier to add humidity to the air in your home. This can make breathing easier.  Rest as needed.  Drink enough fluid to keep your urine clear or pale yellow.  Cover your mouth and nose when you cough or sneeze.  Wash your hands with soap and water often, especially after you cough or sneeze. If soap and water are not available, use hand sanitizer.    Stay home from work or school as told by your health care provider. Unless you are visiting your health care provider, try to avoid leaving home until your fever has been gone for 24 hours without the use of medicine.  Keep all follow-up visits as told by your health care provider. This is important. How is this prevented?  Getting an annual flu shot is the best way  to avoid getting the flu. You may get the flu shot in late summer, fall, or winter. Ask your health care provider when you should get your flu shot.  Wash your hands often or use hand sanitizer often.  Avoid contact with people who are sick during cold and flu season.  Eat a healthy diet, drink plenty of fluids, get enough sleep, and exercise regularly. Contact a health care provider if:  You develop new symptoms.  You have: ? Chest pain. ? Diarrhea. ? A fever.  Your cough gets worse.  You produce more mucus.  You feel nauseous or you vomit. Get help right away if:  You develop shortness of breath or difficulty breathing.  Your skin or nails turn a bluish color.  You have severe pain or stiffness in your neck.  You develop a sudden headache or sudden pain in your face or ear.  You cannot stop vomiting. This information is not intended to replace advice given to you by your health care provider. Make sure you discuss any questions you have with your health care provider. Document Released: 02/07/2000 Document Revised: 07/18/2015 Document Reviewed: 12/04/2014 Elsevier Interactive Patient Education  2017 [employer]    IF you received an x-ray today, you will receive an invoice from [facility]. Please contact [facility] at [phone] with questions or concerns regarding your invoice.   IF you received labwork today, you will receive an invoice from [facility]. Please contact [facility] at [phone] with questions or concerns regarding your invoice.   Our billing staff will not be able to assist you with questions regarding bills from these companies.  You will be contacted with the lab results as soon as they are available. The fastest way to get your results is to activate your My Chart account. Instructions are located on the last page of this paperwork. If you have not heard from us regarding the results in 2 weeks, please contact this office.       

## 2016-03-25 NOTE — Progress Notes (Signed)
Aimee King is a 36 y.o. female who presents to [place] at [facility] today for  Chief Complaint  Patient presents with  . Flu like symptoms   1.  Flu: Has been sick for 2 days. Nasal discharge: yes Medications tried: none Sick contacts: daughter has the flu and son is sick  Symptoms Fever: yes, subjective Headache or face pain: no Tooth pain: no Sneezing: no Scratchy throat: yes Allergies: no Muscle aches: yes Severe fatigue: yes Stiff neck: no Shortness of breath: no Rash: no Sore throat or swollen glands: no    ROS as above.  Pertinently, no chest pain, palpitations, SOB, Fever, Chills, Abd pain, N/V/D.   PMH reviewed. Patient is a nonsmoker.   Past Medical History:  Diagnosis Date  . ADD (attention deficit disorder)   . Allergy   . Anemia   . Attention deficit disorder (ADD)   . Depression   . Endometriosis    Past Surgical History:  Procedure Laterality Date  . CESAREAN SECTION  2004, 2008  . CESAREAN SECTION    . CHOLECYSTECTOMY  2004  . CHOLECYSTECTOMY    . LAPAROSCOPY  2002   endometriosis  . TUBAL LIGATION  2008  . TUBAL LIGATION      Medications reviewed. Current Outpatient Prescriptions  Medication Sig Dispense Refill  . Amphet-Dextroamphet 3-Bead ER (MYDAYIS) 12.5 MG CP24 Take 12.5 capsules by mouth daily. 30 capsule 0  . fluticasone (FLONASE) 50 MCG/ACT nasal spray Place 2 sprays into both nostrils daily. 16 g 6  . levonorgestrel (MIRENA) 20 MCG/24HR IUD 1 each by Intrauterine route once.    . polyethylene glycol (MIRALAX / GLYCOLAX) packet Take 17 g by mouth daily.    . benzonatate (TESSALON) 200 MG capsule Take 1 capsule (200 mg total) by mouth 2 (two) times daily as needed for cough. (Patient not taking: Reported on 03/25/2016) 30 capsule 3  . cyclobenzaprine (FLEXERIL) 10 MG tablet Take 1 tablet (10 mg total) by mouth 3 (three) times daily as needed for muscle spasms. (Patient not taking: Reported on 03/25/2016) 30 tablet 0  .  oseltamivir (TAMIFLU) 75 MG capsule Take 1 capsule (75 mg total) by mouth 2 (two) times daily. 10 capsule 0  . Vitamin D, Ergocalciferol, (DRISDOL) 50000 units CAPS capsule Take 1 capsule (50,000 Units total) by mouth every 7 (seven) days. (Patient not taking: Reported on 03/25/2016) 12 capsule 0   No current facility-administered medications for this visit.      Physical Exam:  BP 132/84   Pulse 78   Temp 98.5 F (36.9 C) (Oral)   Resp 16   Ht 4\' 11"  (1.499 m)   Wt 186 lb 6.4 oz (84.6 kg)   SpO2 98%   BMI 37.65 kg/m  Gen:  Alert, cooperative patient who appears stated age in no acute distress.  Vital signs reviewed. HEENT: EOMI,  MMM, nasal discharge bilaterally Pulm:  Clear to auscultation bilaterally with good air movement.  No wheezes or rales noted.   Cardiac:  Regular rate and rhythm without murmur auscultated.  Good S1/S2. Abd:  Soft/nondistended/nontender.  Good bowel sounds throughout all four quadrants.  No masses noted.  Exts: Non edematous BL  LE, warm and well perfused.   Assessment and Plan:  1.  Influenza: Likely influenza based on symptoms and with daughter in the home who has + flu and being treated. No signs of pneumonia.  - Tamiflu x 5 day course - Symptomatic treatment: plenty of rest, fluids, warm tea  with honey, humidifier - note for school provided  Smitty Cords, MD [facility], PGY-2

## 2016-05-06 ENCOUNTER — Other Ambulatory Visit: Payer: Self-pay | Admitting: Family Medicine

## 2016-05-20 ENCOUNTER — Encounter: Payer: Self-pay | Admitting: Family Medicine

## 2016-05-20 ENCOUNTER — Ambulatory Visit (INDEPENDENT_AMBULATORY_CARE_PROVIDER_SITE_OTHER): Payer: BC Managed Care – PPO | Admitting: Family Medicine

## 2016-05-20 VITALS — BP 108/75 | HR 88 | Temp 98.9°F | Resp 16 | Ht 59.0 in | Wt 184.0 lb

## 2016-05-20 DIAGNOSIS — F900 Attention-deficit hyperactivity disorder, predominantly inattentive type: Secondary | ICD-10-CM | POA: Diagnosis not present

## 2016-05-20 DIAGNOSIS — Z5181 Encounter for therapeutic drug level monitoring: Secondary | ICD-10-CM | POA: Diagnosis not present

## 2016-05-20 DIAGNOSIS — H6121 Impacted cerumen, right ear: Secondary | ICD-10-CM | POA: Diagnosis not present

## 2016-05-20 MED ORDER — AMPHET-DEXTROAMPHET 3-BEAD ER 25 MG PO CP24: 1.0000 | ORAL_CAPSULE | ORAL | 0 refills | Status: DC

## 2016-05-20 NOTE — Progress Notes (Signed)
Chief Complaint  Patient presents with  . Follow-up    med check    HPI  Since last visit she started Mydayis  She reports that it works but not enough She reports that she notices that she is still distracted She reports that she can stay on task compared to without any medication She reports that she feels it wane at 2pm and so she feels drowy  Around 3pm she feels it peak again and is out of her system by 8pm  Compared to Adderall she was not able to function when she got home.   She reports that she does not like who she is without her medication because of irritability, amotivational nature and difficulty finishing tasks   She denies side effects  - no tachycardia - no appetite changes - no difficulty with sleep   Past Medical History:  Diagnosis Date  . ADD (attention deficit disorder)   . Allergy   . Anemia   . Attention deficit disorder (ADD)   . Depression   . Endometriosis     Current Outpatient Prescriptions  Medication Sig Dispense Refill  . fluticasone (FLONASE) 50 MCG/ACT nasal spray Place 2 sprays into both nostrils daily. 16 g 6  . levonorgestrel (MIRENA) 20 MCG/24HR IUD 1 each by Intrauterine route once.    . polyethylene glycol (MIRALAX / GLYCOLAX) packet Take 17 g by mouth daily.    . Vitamin D, Ergocalciferol, (DRISDOL) 50000 units CAPS capsule TAKE 1 CAPSULE (50,000 UNITS TOTAL) BY MOUTH EVERY 7 (SEVEN) DAYS. 1 capsule 0  . [START ON 06/20/2016] Amphet-Dextroamphet 3-Bead ER (MYDAYIS) 25 MG CP24 Take 1 capsule by mouth every morning. 30 capsule 0  . [START ON 07/19/2016] Amphet-Dextroamphet 3-Bead ER (MYDAYIS) 25 MG CP24 Take 1 capsule by mouth every morning. 30 capsule 0  . benzonatate (TESSALON) 200 MG capsule Take 1 capsule (200 mg total) by mouth 2 (two) times daily as needed for cough. (Patient not taking: Reported on 03/25/2016) 30 capsule 3  . cyclobenzaprine (FLEXERIL) 10 MG tablet Take 1 tablet (10 mg total) by mouth 3 (three) times daily as  needed for muscle spasms. (Patient not taking: Reported on 03/25/2016) 30 tablet 0   No current facility-administered medications for this visit.     Allergies:  Allergies  Allergen Reactions  . Codeine Nausea And Vomiting  . Codeine Nausea And Vomiting  . Latex Itching  . Latex     Feels like little cuts in her vagina.    Past Surgical History:  Procedure Laterality Date  . CESAREAN SECTION  2004, 2008  . CESAREAN SECTION    . CHOLECYSTECTOMY  2004  . CHOLECYSTECTOMY    . LAPAROSCOPY  2002   endometriosis  . TUBAL LIGATION  2008  . TUBAL LIGATION      Social History   Social History  . Marital status: Married    Spouse name: N/A  . Number of children: N/A  . Years of education: N/A   Social History Main Topics  . Smoking status: Never Smoker  . Smokeless tobacco: Never Used  . Alcohol use 0.0 oz/week     Comment: ocassionally  . Drug use: No  . Sexual activity: Yes    Partners: Male    Birth control/ protection: IUD   Other Topics Concern  . None   Social History Narrative   ** Merged History Encounter **        ROS See hpi  Objective: Vitals:   05/20/16 1705  05/20/16 1725  BP: 108/75   Pulse: 100 88  Resp: 16   Temp: 98.9 F (37.2 C)   TempSrc: Oral   SpO2: 97%   Weight: 184 lb (83.5 kg)   Height: 4\' 11"  (1.499 m)     Physical Exam General: alert, oriented, in NAD Head: normocephalic, atraumatic Eyes: EOM intact, no scleral icterus or conjunctival injection Ears: TM clear RIGHT, TM obscured by wax on left Nose: mucosa nonerythematous, nonedematous Throat: no pharyngeal exudate or erythema Lymph: no posterior auricular, submental or cervical lymph adenopathy Heart: normal rate, normal sinus rhythm, no murmurs Lungs: clear to auscultation bilaterally, no wheezing   Assessment and Plan Aimee King was seen today for follow-up.  Diagnoses and all orders for this visit:  Attention deficit hyperactivity disorder (ADHD), predominantly  inattentive type- improved with Mydayis Gave 3 months worth of refills Pt did not pick up last refill (Due end of March) thus we should discharge from the refill stack at the front desk  Encounter for medication monitoring- increased dose, 3 months follow up  Impacted cerumen of right ear- resolved after lavage -     Ear wax removal  Other orders -     Discontinue: Amphet-Dextroamphet 3-Bead ER (MYDAYIS) 25 MG CP24; Take 1 capsule by mouth every morning. -     Amphet-Dextroamphet 3-Bead ER (MYDAYIS) 25 MG CP24; Take 1 capsule by mouth every morning. -     Amphet-Dextroamphet 3-Bead ER (MYDAYIS) 25 MG CP24; Take 1 capsule by mouth every morning.     [place]

## 2016-05-20 NOTE — Patient Instructions (Addendum)
IF you received an x-ray today, you will receive an invoice from [facility]. Please contact [facility] at [phone] with questions or concerns regarding your invoice.   IF you received labwork today, you will receive an invoice from [facility]. Please contact [facility] at [phone] with questions or concerns regarding your invoice.   Our billing staff will not be able to assist you with questions regarding bills from these companies.  You will be contacted with the lab results as soon as they are available. The fastest way to get your results is to activate your My Chart account. Instructions are located on the last page of this paperwork. If you have not heard from [place] regarding the results in 2 weeks, please contact this office.      Attention Deficit Hyperactivity Disorder, Pediatric Attention deficit hyperactivity disorder (ADHD) is a condition that can make it hard for a child to pay attention and concentrate or to control his or her behavior. The child may also have a lot of energy. ADHD is a disorder of the brain (neurodevelopmental disorder), and symptoms are typically first seen in early childhood. It is a common reason for behavioral and academic problems in school. There are three main types of ADHD:  Inattentive. With this type, children have difficulty paying attention.  Hyperactive-impulsive. With this type, children have a lot of energy and have difficulty controlling their behavior.  Combination. This type involves having symptoms of both of the other types. ADHD is a lifelong condition. If it is not treated, the disorder can affect a child's future academic achievement, employment, and relationships. What are the causes? The exact cause of this condition is not known. What increases the risk? This condition is more likely to develop in:  Children who have a first-degree relative, such as a parent or brother or sister, with the  condition.  Children who had a low birth weight.  Children whose mothers had problems during pregnancy or used alcohol or tobacco during pregnancy.  Children who have had a brain infection or a head injury.  Children who have been exposed to lead. What are the signs or symptoms? Symptoms of this condition depend on the type of ADHD. Symptoms are listed here for each type: Inattentive   Problems with organization.  Difficulty staying focused.  Problems completing assignments at school.  Often making simple mistakes.  Problems sustaining mental effort.  Not listening to instructions.  Losing things often.  Forgetting things often.  Being easily distracted. Hyperactive-impulsive   Fidgeting often.  Difficulty sitting still in one's seat.  Talking a lot.  Talking out of turn.  Interrupting others.  Difficulty relaxing or doing quiet activities.  High energy levels and constant movement.  Difficulty waiting.  Always "on the go." Combination   Having symptoms of both of the other types. Children with ADHD may feel frustrated with themselves and may find school to be particularly discouraging. They often perform below their abilities in school. As children get older, the excess movement can lessen, but the problems with paying attention and staying organized often continue. Most children do not outgrow ADHD, but with good treatment, they can learn to cope with the symptoms. How is this diagnosed? This condition is diagnosed based on a child's symptoms and academic history. The child's health care provider will do a complete assessment. As part of the assessment, the health care provider will ask the child questions and will ask the parents and teachers for their observations of  the child. The health care provider looks for specific symptoms of ADHD. Diagnosis will include:  Ruling out other reasons for the child's behavior.  Reviewing behavior rating scales that  have been filled out about the child by people who deal with the child on a daily basis. A diagnosis is made only after all information from multiple people has been considered. How is this treated? Treatment for this condition may include:  Behavior therapy.  Medicines to decrease impulsivity and hyperactivity and to increase attention. Behavior therapy is preferred for children younger than 8 years old. The combination of medicine and behavior therapy is most effective for children older than 57 years of age.  Tutoring or extra support at school.  Techniques for parents to use at home to help manage their child's symptoms and behavior. Follow these instructions at home: Eating and drinking   Offer your child a well-balanced diet. Breakfast that includes a balance of whole grains, protein, and fruits or vegetables is especially important for school performance.  If your child has trouble with hyperactivity, have your child avoid drinks that contain caffeine. These include:  Soft drinks.  Coffee.  Tea.  If your child is older and finds that caffeinated drinks help to improve his or her attention, talk with your child's health care provider about what amount of caffeine intake is a safe for your child. Lifestyle    Make sure your child gets a full night of sleep and regular daily exercise.  Help manage your child's behavior by following the techniques learned in therapy. These may include:  Looking for good behavior and rewarding it.  Making rules for behavior that your child can understand and follow.  Giving clear instructions.  Responding consistently to your child's challenging behaviors.  Setting realistic goals.  Looking for activities that can lead to success and self-esteem.  Making time for pleasant activities with your child.  Giving lots of affection.  Help your child learn to be organized. Some ways to do this include:  Keeping daily schedules the same.  Have a regular wake-up time and bedtime for your child. Schedule all activities, including time for homework and time for play. Post the schedule in a place where your child will see it. Mark schedule changes in advance.  Having a regular place for your child to store items such as clothing, backpacks, and school supplies.  Encouraging your child to write down school assignments and to bring home needed books. Work with your child's teachers for assistance in organizing school work. General instructions   Learn as much as you can about ADHD. This will improve your ability to help your child and to make sure he or she gets the support needed. It will also help you educate your child's teachers and instructors if they do not feel that they have adequate knowledge or experience in these areas.  Work with your child's teachers to make sure your child gets the support and extra help that is needed. This may include:  Tutoring.  Teacher cues to help your child remain on task.  Seating changes so your child is working at a desk that is free from distractions.  Give over-the-counter and prescription medicines only as told by your child's health care provider.  Keep all follow-up visits as told by your health care provider. This is important. Contact a health care provider if:  Your child has repeated muscle twitches (tics), coughs, or speech outbursts.  Your child has sleep problems.  Your child  has a marked loss of appetite.  Your child develops depression.  Your child has new or worsening behavioral problems.  Your child has dizziness.  Your child has a racing heart.  Your child has stomach pains.  Your child develops headaches. Get help right away if:  Your child talks about or threatens suicide.  You are worried that your child is having a bad reaction to a medicine that he or she is taking for ADHD. This information is not intended to replace advice given to you by your  health care provider. Make sure you discuss any questions you have with your health care provider. Document Released: 01/30/2002 Document Revised: 10/09/2015 Document Reviewed: 09/05/2015 Elsevier Interactive Patient Education  2017 [employer].

## 2016-08-19 ENCOUNTER — Ambulatory Visit (INDEPENDENT_AMBULATORY_CARE_PROVIDER_SITE_OTHER): Payer: BC Managed Care – PPO | Admitting: Physician Assistant

## 2016-08-19 ENCOUNTER — Encounter: Payer: Self-pay | Admitting: Physician Assistant

## 2016-08-19 ENCOUNTER — Ambulatory Visit: Payer: Self-pay | Admitting: Family Medicine

## 2016-08-19 VITALS — BP 104/73 | HR 103 | Temp 98.3°F | Resp 16 | Ht 59.0 in | Wt 188.8 lb

## 2016-08-19 DIAGNOSIS — R0989 Other specified symptoms and signs involving the circulatory and respiratory systems: Secondary | ICD-10-CM | POA: Diagnosis not present

## 2016-08-19 LAB — GLUCOSE, POCT (MANUAL RESULT ENTRY): POC Glucose: 99 mg/dl (ref 70–99)

## 2016-08-19 MED ORDER — ALBUTEROL SULFATE (2.5 MG/3ML) 0.083% IN NEBU
2.5000 mg | INHALATION_SOLUTION | Freq: Once | RESPIRATORY_TRACT | Status: AC
  Administered 2016-08-19: 2.5 mg via RESPIRATORY_TRACT

## 2016-08-19 MED ORDER — IPRATROPIUM BROMIDE 0.02 % IN SOLN
0.5000 mg | Freq: Once | RESPIRATORY_TRACT | Status: AC
  Administered 2016-08-19: 0.5 mg via RESPIRATORY_TRACT

## 2016-08-19 MED ORDER — ALBUTEROL SULFATE HFA 108 (90 BASE) MCG/ACT IN AERS: 2.0000 | INHALATION_SPRAY | Freq: Four times a day (QID) | RESPIRATORY_TRACT | 0 refills | Status: DC | PRN

## 2016-08-19 MED ORDER — PREDNISONE 50 MG PO TABS: 50.0000 mg | ORAL_TABLET | Freq: Every day | ORAL | 0 refills | Status: AC

## 2016-08-19 NOTE — Progress Notes (Signed)
08/20/2016 8:11 AM   DOB: [birth year] / MRN: [id number]  SUBJECTIVE:  Aimee King is a 36 y.o. female presenting for cough that started three weeks ago. Tells me this is a dry cough and worse at night.  She denies SOB, chest pain and DOE.  No hx of diabetes.  Denies a history of asthma however has a history of seasonal allergies. She has tried several OTC medications without relief.   She is allergic to codeine; codeine; latex; and latex.   She  has a past medical history of ADD (attention deficit disorder); Allergy; Anemia; Attention deficit disorder (ADD); Depression; and Endometriosis.    She  reports that she has never smoked. She has never used smokeless tobacco. She reports that she drinks alcohol. She reports that she does not use drugs. She  reports that she currently engages in sexual activity and has had female partners. She reports using the following method of birth control/protection: IUD. The patient  has a past surgical history that includes Cesarean section (2004, 2008); Cholecystectomy (2004); laparoscopy (2002); Tubal ligation (2008); Cholecystectomy; Cesarean section; and Tubal ligation.  Her family history includes Diabetes in her father, maternal grandmother, and mother; Heart disease in her father; Hypertension in her father and mother.  Review of Systems  Constitutional: Negative for chills and fever.  HENT: Negative for sore throat.   Gastrointestinal: Negative for nausea.  Skin: Negative for itching and rash.  Neurological: Negative for dizziness.    The problem list and medications were reviewed and updated by myself where necessary and exist elsewhere in the encounter.   OBJECTIVE:  BP 104/73 (BP Location: Right Arm, Patient Position: Sitting, Cuff Size: Large)   Pulse (!) 103   Temp 98.3 F (36.8 C) (Oral)   Resp 16   Ht 4\' 11"  (1.499 m)   Wt 188 lb 12.8 oz (85.6 kg)   LMP 07/20/2016   SpO2 98%   BMI 38.13 kg/m   BP Readings from Last 3  Encounters:  08/19/16 104/73  05/20/16 108/75  03/25/16 132/84   Pulse Readings from Last 3 Encounters:  08/19/16 (!) 103  05/20/16 88  03/25/16 78   Physical Exam  Constitutional: She is oriented to person, place, and time. She appears well-developed and well-nourished. No distress.  Cardiovascular: Normal rate, regular rhythm and normal heart sounds.   Pulmonary/Chest: Effort normal.  Lungs tight upon initial auscultation.  Post neb breath sounds normal.   Musculoskeletal: Normal range of motion.  Neurological: She is alert and oriented to person, place, and time.  Skin: Skin is warm and dry. She is not diaphoretic.  Psychiatric: She has a normal mood and affect.    Results for orders placed or performed in visit on 08/19/16 (from the past 72 hour(s))  POCT glucose (manual entry)     Status: Normal   Collection Time: 08/19/16  6:20 PM  Result Value Ref Range   POC Glucose 99 70 - 99 mg/dl    No results found.  ASSESSMENT AND PLAN:  Aimee King was seen today for cough.  Diagnoses and all orders for this visit:  Abnormally low peak expiratory flow rate: Normalized post nebs.  Will start pred burst and provide inhaler q6.   -     POCT glucose (manual entry) -     albuterol (PROVENTIL) (2.5 MG/3ML) 0.083% nebulizer solution 2.5 mg; Take 3 mLs (2.5 mg total) by nebulization once. -     ipratropium (ATROVENT) nebulizer solution 0.5 mg; Take 2.5  mLs (0.5 mg total) by nebulization once. -     predniSONE (DELTASONE) 50 MG tablet; Take 1 tablet (50 mg total) by mouth daily with breakfast. -     albuterol (PROVENTIL HFA;VENTOLIN HFA) 108 (90 Base) MCG/ACT inhaler; Inhale 2 puffs into the lungs every 6 (six) hours as needed for wheezing or shortness of breath.    The patient is advised to call or return to clinic if she does not see an improvement in symptoms, or to seek the care of the closest emergency department if she worsens with the above plan.   Philis Fendt, MHS,  PA-C [facility] 08/20/2016 8:11 AM

## 2016-08-19 NOTE — Progress Notes (Deleted)
  No chief complaint on file.   HPI  Pt is here for ADD follow up She reports that her Mydayis 25mg  is    Past Medical History:  Diagnosis Date  . ADD (attention deficit disorder)   . Allergy   . Anemia   . Attention deficit disorder (ADD)   . Depression   . Endometriosis     Current Outpatient Prescriptions  Medication Sig Dispense Refill  . Amphet-Dextroamphet 3-Bead ER (MYDAYIS) 25 MG CP24 Take 1 capsule by mouth every morning. 30 capsule 0  . Amphet-Dextroamphet 3-Bead ER (MYDAYIS) 25 MG CP24 Take 1 capsule by mouth every morning. 30 capsule 0  . benzonatate (TESSALON) 200 MG capsule Take 1 capsule (200 mg total) by mouth 2 (two) times daily as needed for cough. (Patient not taking: Reported on 03/25/2016) 30 capsule 3  . cyclobenzaprine (FLEXERIL) 10 MG tablet Take 1 tablet (10 mg total) by mouth 3 (three) times daily as needed for muscle spasms. (Patient not taking: Reported on 03/25/2016) 30 tablet 0  . fluticasone (FLONASE) 50 MCG/ACT nasal spray Place 2 sprays into both nostrils daily. 16 g 6  . levonorgestrel (MIRENA) 20 MCG/24HR IUD 1 each by Intrauterine route once.    . polyethylene glycol (MIRALAX / GLYCOLAX) packet Take 17 g by mouth daily.    . Vitamin D, Ergocalciferol, (DRISDOL) 50000 units CAPS capsule TAKE 1 CAPSULE (50,000 UNITS TOTAL) BY MOUTH EVERY 7 (SEVEN) DAYS. 1 capsule 0   No current facility-administered medications for this visit.     Allergies:  Allergies  Allergen Reactions  . Codeine Nausea And Vomiting  . Codeine Nausea And Vomiting  . Latex Itching  . Latex     Feels like little cuts in her vagina.    Past Surgical History:  Procedure Laterality Date  . CESAREAN SECTION  2004, 2008  . CESAREAN SECTION    . CHOLECYSTECTOMY  2004  . CHOLECYSTECTOMY    . LAPAROSCOPY  2002   endometriosis  . TUBAL LIGATION  2008  . TUBAL LIGATION      Social History   Social History  . Marital status: Married    Spouse name: N/A  . Number of  children: N/A  . Years of education: N/A   Social History Main Topics  . Smoking status: Never Smoker  . Smokeless tobacco: Never Used  . Alcohol use 0.0 oz/week     Comment: ocassionally  . Drug use: No  . Sexual activity: Yes    Partners: Male    Birth control/ protection: IUD   Other Topics Concern  . Not on file   Social History Narrative   ** Merged History Encounter **        ROS  Objective: There were no vitals filed for this visit.  Physical Exam  Assessment and Plan There are no diagnoses linked to this encounter.   [place]

## 2016-08-19 NOTE — Patient Instructions (Signed)
     IF you received an x-ray today, you will receive an invoice from [facility]. Please contact [facility] at [phone] with questions or concerns regarding your invoice.   IF you received labwork today, you will receive an invoice from [facility]. Please contact [facility] at [phone] with questions or concerns regarding your invoice.   Our billing staff will not be able to assist you with questions regarding bills from these companies.  You will be contacted with the lab results as soon as they are available. The fastest way to get your results is to activate your My Chart account. Instructions are located on the last page of this paperwork. If you have not heard from us regarding the results in 2 weeks, please contact this office.     

## 2016-09-08 ENCOUNTER — Ambulatory Visit (INDEPENDENT_AMBULATORY_CARE_PROVIDER_SITE_OTHER): Payer: BC Managed Care – PPO

## 2016-09-08 ENCOUNTER — Ambulatory Visit (INDEPENDENT_AMBULATORY_CARE_PROVIDER_SITE_OTHER): Payer: BC Managed Care – PPO | Admitting: Family Medicine

## 2016-09-08 VITALS — BP 115/77 | HR 75 | Temp 98.6°F | Resp 18 | Ht 59.0 in | Wt 191.0 lb

## 2016-09-08 DIAGNOSIS — J040 Acute laryngitis: Secondary | ICD-10-CM | POA: Diagnosis not present

## 2016-09-08 DIAGNOSIS — R05 Cough: Secondary | ICD-10-CM | POA: Diagnosis not present

## 2016-09-08 DIAGNOSIS — E559 Vitamin D deficiency, unspecified: Secondary | ICD-10-CM

## 2016-09-08 DIAGNOSIS — J9801 Acute bronchospasm: Secondary | ICD-10-CM

## 2016-09-08 DIAGNOSIS — R0602 Shortness of breath: Secondary | ICD-10-CM

## 2016-09-08 DIAGNOSIS — R059 Cough, unspecified: Secondary | ICD-10-CM

## 2016-09-08 MED ORDER — ALBUTEROL SULFATE (2.5 MG/3ML) 0.083% IN NEBU: 2.5000 mg | INHALATION_SOLUTION | Freq: Four times a day (QID) | RESPIRATORY_TRACT | 1 refills | Status: DC | PRN

## 2016-09-08 MED ORDER — AMPHET-DEXTROAMPHET 3-BEAD ER 25 MG PO CP24: 1.0000 | ORAL_CAPSULE | ORAL | 0 refills | Status: DC

## 2016-09-08 MED ORDER — PANTOPRAZOLE SODIUM 40 MG PO TBEC: 40.0000 mg | DELAYED_RELEASE_TABLET | Freq: Every day | ORAL | 0 refills | Status: DC

## 2016-09-08 NOTE — Patient Instructions (Addendum)
   IF you received an x-ray today, you will receive an invoice from [facility]. Please contact [facility] at [phone] with questions or concerns regarding your invoice.   IF you received labwork today, you will receive an invoice from [facility]. Please contact [facility] at [phone] with questions or concerns regarding your invoice.   Our billing staff will not be able to assist you with questions regarding bills from these companies.  You will be contacted with the lab results as soon as they are available. The fastest way to get your results is to activate your My Chart account. Instructions are located on the last page of this paperwork. If you have not heard from us regarding the results in 2 weeks, please contact this office.     Gastroesophageal Reflux Disease, Adult Normally, food travels down the esophagus and stays in the stomach to be digested. If a person has gastroesophageal reflux disease (GERD), food and stomach acid move back up into the esophagus. When this happens, the esophagus becomes sore and swollen (inflamed). Over time, GERD can make small holes (ulcers) in the lining of the esophagus. Follow these instructions at home: Diet  Follow a diet as told by your doctor. You may need to avoid foods and drinks such as: ? Coffee and tea (with or without caffeine). ? Drinks that contain alcohol. ? Energy drinks and sports drinks. ? Carbonated drinks or sodas. ? Chocolate and cocoa. ? Peppermint and mint flavorings. ? Garlic and onions. ? Horseradish. ? Spicy and acidic foods, such as peppers, chili powder, curry powder, vinegar, hot sauces, and BBQ sauce. ? Citrus fruit juices and citrus fruits, such as oranges, lemons, and limes. ? Tomato-based foods, such as red sauce, chili, salsa, and pizza with red sauce. ? Fried and fatty foods, such as donuts, french fries, potato chips, and high-fat dressings. ? High-fat meats, such as hot dogs, rib eye  steak, sausage, ham, and bacon. ? High-fat dairy items, such as whole milk, butter, and cream cheese.  Eat small meals often. Avoid eating large meals.  Avoid drinking large amounts of liquid with your meals.  Avoid eating meals during the 2-3 hours before bedtime.  Avoid lying down right after you eat.  Do not exercise right after you eat. General instructions  Pay attention to any changes in your symptoms.  Take over-the-counter and prescription medicines only as told by your doctor. Do not take aspirin, ibuprofen, or other NSAIDs unless your doctor says it is okay.  Do not use any tobacco products, including cigarettes, chewing tobacco, and e-cigarettes. If you need help quitting, ask your doctor.  Wear loose clothes. Do not wear anything tight around your waist.  Raise (elevate) the head of your bed about 6 inches (15 cm).  Try to lower your stress. If you need help doing this, ask your doctor.  If you are overweight, lose an amount of weight that is healthy for you. Ask your doctor about a safe weight loss goal.  Keep all follow-up visits as told by your doctor. This is important. Contact a doctor if:  You have new symptoms.  You lose weight and you do not know why it is happening.  You have trouble swallowing, or it hurts to swallow.  You have wheezing or a cough that keeps happening.  Your symptoms do not get better with treatment.  You have a hoarse voice. Get help right away if:  You have pain in your arms, neck, jaw, teeth, or   back.  You feel sweaty, dizzy, or light-headed.  You have chest pain or shortness of breath.  You throw up (vomit) and your throw up looks like blood or coffee grounds.  You pass out (faint).  Your poop (stool) is bloody or black.  You cannot swallow, drink, or eat. This information is not intended to replace advice given to you by your health care provider. Make sure you discuss any questions you have with your health care  provider. Document Released: 07/29/2007 Document Revised: 07/18/2015 Document Reviewed: 06/06/2014 Elsevier Interactive Patient Education  2018 [employer].  Laryngitis Laryngitis is inflammation of your vocal cords. This causes hoarseness, coughing, loss of voice, sore throat, or a dry throat. Your vocal cords are two bands of muscles that are found in your throat. When you speak, these cords come together and vibrate. These vibrations come out through your mouth as sound. When your vocal cords are inflamed, your voice sounds different. Laryngitis can be temporary (acute) or long-term (chronic). Most cases of acute laryngitis improve with time. Chronic laryngitis is laryngitis that lasts for more than three weeks. What are the causes? Acute laryngitis may be caused by:  A viral infection.  Lots of talking, yelling, or singing. This is also called vocal strain.  Bacterial infections.  Chronic laryngitis may be caused by:  Vocal strain.  Injury to your vocal cords.  Acid reflux (gastroesophageal reflux disease or GERD).  Allergies.  Sinus infection.  Smoking.  Alcohol abuse.  Breathing in chemicals or dust.  Growths on the vocal cords.  What increases the risk? Risk factors for laryngitis include:  Smoking.  Alcohol abuse.  Having allergies.  What are the signs or symptoms? Symptoms of laryngitis may include:  Low, hoarse voice.  Loss of voice.  Dry cough.  Sore throat.  Stuffy nose.  How is this diagnosed? Laryngitis may be diagnosed by:  Physical exam.  Throat culture.  Blood test.  Laryngoscopy. This procedure allows your health care provider to look at your vocal cords with a mirror or viewing tube.  How is this treated? Treatment for laryngitis depends on what is causing it. Usually, treatment involves resting your voice and using medicines to soothe your throat. However, if your laryngitis is caused by a bacterial infection, you may need to  take antibiotic medicine. If your laryngitis is caused by a growth, you may need to have a procedure to remove it. Follow these instructions at home:  Drink enough fluid to keep your urine clear or pale yellow.  Breathe in moist air. Use a humidifier if you live in a dry climate.  Take medicines only as directed by your health care provider.  If you were prescribed an antibiotic medicine, finish it all even if you start to feel better.  Do not smoke cigarettes or electronic cigarettes. If you need help quitting, ask your health care provider.  Talk as little as possible. Also avoid whispering, which can cause vocal strain.  Write instead of talking. Do this until your voice is back to normal. Contact a health care provider if:  You have a fever.  You have increasing pain.  You have difficulty swallowing. Get help right away if:  You cough up blood.  You have trouble breathing. This information is not intended to replace advice given to you by your health care provider. Make sure you discuss any questions you have with your health care provider. Document Released: 02/09/2005 Document Revised: 07/18/2015 Document Reviewed: 07/25/2013 Elsevier Interactive Patient  Education  2018 [employer]  

## 2016-09-08 NOTE — Progress Notes (Signed)
Chief Complaint  Patient presents with  . Cough    Onset: May 2018, Not better with prednisone or pump.    HPI Cough Pt reports that she had some cough since May 2018 She started cough suppressants and allergy medications She was given prednisone and albuterol without any improvement She states that she is coughing worse at night She cannot sleep on her abdomen and reports that she can get some burning  She reports that she sometimes cannot get a break from the coughing spells until she uses her inhaler  Vitamin D She has been taking her supplement but did not know how long she should take it She stopped a month ago Her level in December was 18.5 She reports continued fatigue    Past Medical History:  Diagnosis Date  . ADD (attention deficit disorder)   . Allergy   . Anemia   . Attention deficit disorder (ADD)   . Depression   . Endometriosis     Current Outpatient Prescriptions  Medication Sig Dispense Refill  . albuterol (PROVENTIL HFA;VENTOLIN HFA) 108 (90 Base) MCG/ACT inhaler Inhale 2 puffs into the lungs every 6 (six) hours as needed for wheezing or shortness of breath. 1 Inhaler 0  . fluticasone (FLONASE) 50 MCG/ACT nasal spray Place 2 sprays into both nostrils daily. 16 g 6  . levonorgestrel (MIRENA) 20 MCG/24HR IUD 1 each by Intrauterine route once.    . polyethylene glycol (MIRALAX / GLYCOLAX) packet Take 17 g by mouth daily.    Marland Kitchen albuterol (PROVENTIL) (2.5 MG/3ML) 0.083% nebulizer solution Take 3 mLs (2.5 mg total) by nebulization every 6 (six) hours as needed for wheezing or shortness of breath. 150 mL 1  . Amphet-Dextroamphet 3-Bead ER (MYDAYIS) 25 MG CP24 Take 1 capsule by mouth every morning. 30 capsule 0  . benzonatate (TESSALON) 100 MG capsule Take 1-2 capsules (100-200 mg total) by mouth 3 (three) times daily as needed for cough. 40 capsule 1  . cetirizine (ZYRTEC) 10 MG tablet Take 1 tablet (10 mg total) by mouth daily. 30 tablet 11  . pantoprazole  (PROTONIX) 40 MG tablet Take 1 tablet (40 mg total) by mouth daily. 30 tablet 1  . ranitidine (ZANTAC) 150 MG tablet Take 1 tablet (150 mg total) by mouth 2 (two) times daily. 60 tablet 0  . Vitamin D, Ergocalciferol, (DRISDOL) 50000 units CAPS capsule Take 1 capsule (50,000 Units total) by mouth every 7 (seven) days. 30 capsule 0   No current facility-administered medications for this visit.     Allergies:  Allergies  Allergen Reactions  . Latex Swelling  . Codeine Nausea And Vomiting  . Codeine Nausea And Vomiting  . Latex     Feels like little cuts in her vagina.    Past Surgical History:  Procedure Laterality Date  . CESAREAN SECTION  2004, 2008  . CESAREAN SECTION    . CHOLECYSTECTOMY  2004  . CHOLECYSTECTOMY    . LAPAROSCOPY  2002   endometriosis  . TUBAL LIGATION  2008  . TUBAL LIGATION      Social History   Social History  . Marital status: Married    Spouse name: N/A  . Number of children: N/A  . Years of education: N/A   Social History Main Topics  . Smoking status: Never Smoker  . Smokeless tobacco: Never Used  . Alcohol use 0.0 oz/week     Comment: ocassionally  . Drug use: No  . Sexual activity: Yes    Partners:  Male    Birth control/ protection: IUD   Other Topics Concern  . Not on file   Social History Narrative   ** Merged History Encounter **        ROS See hpi  Objective: Vitals:   09/08/16 1558  BP: 115/77  Pulse: 75  Resp: 18  Temp: 98.6 F (37 C)  TempSrc: Oral  SpO2: 100%  Weight: 191 lb (86.6 kg)  Height: 4\' 11"  (1.499 m)    Physical Exam  General: alert, oriented, in NAD Head: normocephalic, atraumatic, no sinus tenderness Eyes: EOM intact, no scleral icterus or conjunctival injection Ears: TM clear bilaterally Nose: mucosa nonerythematous, nonedematous Throat: no pharyngeal exudate or erythema Lymph: no posterior auricular, submental or cervical lymph adenopathy Heart: normal rate, normal sinus rhythm, no  murmurs Lungs: clear to auscultation bilaterally, no wheezing   CLINICAL DATA:  Cough and shortness of breath.  EXAM: CHEST  2 VIEW  COMPARISON:  06/20/2015  FINDINGS: The heart size and mediastinal contours are within normal limits. Both lungs are clear. The visualized skeletal structures are unremarkable.  IMPRESSION: Normal chest x-ray.   Electronically Signed   By: Marijo Sanes M.D.   On: 09/08/2016 16:24   Assessment and Plan Aimee King was seen today for cough.  Diagnoses and all orders for this visit:  SOB (shortness of breath)- CXR negative -     DG Chest 2 View -     DME Nebulizer machine  Acute bronchospasm Laryngitis Cough Likely pt URI lead to forceful coughing that lead to laryngeal reflux We will treat this with albuterol and a PPI -     DG Chest 2 View -     DME Nebulizer machine   Vitamin D deficiency- will assess if levels improved with supplementation -     VITAMIN D 25 Hydroxy (Vit-D Deficiency, Fractures)  Other orders -     Amphet-Dextroamphet 3-Bead ER (MYDAYIS) 25 MG CP24; Take 1 capsule by mouth every morning. -     Discontinue: pantoprazole (PROTONIX) 40 MG tablet; Take 1 tablet (40 mg total) by mouth daily. -     albuterol (PROVENTIL) (2.5 MG/3ML) 0.083% nebulizer solution; Take 3 mLs (2.5 mg total) by nebulization every 6 (six) hours as needed for wheezing or shortness of breath.   A total of 25 minutes were spent face-to-face with the patient during this encounter and over half of that time was spent on counseling and coordination of care. Majority of this counseling time >50% was spent on explaining why no antibiotic would be prescribed.  Reviewing her extra. Explaining why coughing spells lead to reflux which causes coughing.  Pt was hesitant but eventually agreed to a trial of PPI.  [place]

## 2016-09-10 LAB — VITAMIN D 25 HYDROXY (VIT D DEFICIENCY, FRACTURES): Vit D, 25-Hydroxy: 18.2 ng/mL — ABNORMAL LOW (ref 30.0–100.0)

## 2016-09-15 ENCOUNTER — Other Ambulatory Visit: Payer: Self-pay | Admitting: Family Medicine

## 2016-09-15 MED ORDER — VITAMIN D (ERGOCALCIFEROL) 1.25 MG (50000 UNIT) PO CAPS: 50000.0000 [IU] | ORAL_CAPSULE | ORAL | 0 refills | Status: DC

## 2016-09-15 NOTE — Progress Notes (Signed)
Vitamin D refill sent in

## 2016-09-16 ENCOUNTER — Telehealth: Payer: Self-pay | Admitting: Family Medicine

## 2016-09-16 NOTE — Telephone Encounter (Signed)
Pt returned [name]' call regarding her lab results and can be reached back at   [phone]

## 2016-09-16 NOTE — Telephone Encounter (Signed)
Spoke with patient advised vitamin D results the same and Dr. Kaleen Mask has sent over a once weekly vitamin D supplement to her pharmacy.  Patient agreeable and would like for Dr. Kaleen Mask to know that the protonix is working and her cough has reduced dramatically.  Advised would send message. dg

## 2016-10-05 ENCOUNTER — Other Ambulatory Visit: Payer: Self-pay | Admitting: Family Medicine

## 2016-10-06 ENCOUNTER — Encounter: Payer: Self-pay | Admitting: Physician Assistant

## 2016-10-06 ENCOUNTER — Ambulatory Visit (INDEPENDENT_AMBULATORY_CARE_PROVIDER_SITE_OTHER): Payer: BC Managed Care – PPO | Admitting: Physician Assistant

## 2016-10-06 VITALS — BP 114/77 | HR 100 | Temp 98.2°F | Resp 16 | Ht 59.0 in | Wt 191.6 lb

## 2016-10-06 DIAGNOSIS — J45909 Unspecified asthma, uncomplicated: Secondary | ICD-10-CM

## 2016-10-06 DIAGNOSIS — K219 Gastro-esophageal reflux disease without esophagitis: Secondary | ICD-10-CM

## 2016-10-06 MED ORDER — RANITIDINE HCL 150 MG PO TABS: 150.0000 mg | ORAL_TABLET | Freq: Two times a day (BID) | ORAL | 0 refills | Status: DC

## 2016-10-06 MED ORDER — CETIRIZINE HCL 10 MG PO TABS: 10.0000 mg | ORAL_TABLET | Freq: Every day | ORAL | 11 refills | Status: DC

## 2016-10-06 MED ORDER — PANTOPRAZOLE SODIUM 40 MG PO TBEC: 40.0000 mg | DELAYED_RELEASE_TABLET | Freq: Every day | ORAL | 1 refills | Status: DC

## 2016-10-06 MED ORDER — BENZONATATE 100 MG PO CAPS: 100.0000 mg | ORAL_CAPSULE | Freq: Three times a day (TID) | ORAL | 1 refills | Status: DC | PRN

## 2016-10-06 NOTE — Progress Notes (Signed)
PRIMARY CARE AT [facility] [address], [place] [address] [phone]  Date:  10/06/2016   Name:  Deyna Carbon   DOB:  28-Jul-1980   MRN:  [id number]  PCP:  Forrest Moron, MD    History of Present Illness:  Kemyra August is a 36 y.o. female patient who presents to PCP with  Chief Complaint  Patient presents with  . Ear Pain    ONSET: 10/01/2016, per pt c/o continued cough, protonix helps but still has cough since May, if protonix is missed cough is worse.     Patient notes ear pain that has been for about 5 days. Coughing, and taking the protonix.   Patient notes that the protonix is helping, her coughing which she received about 1-2 months ago.  She notes that if she misses the protonix, her cough is worsened--despite taking anti-histamine and allergy medication.  She notes that she has some nausea. She is not following a reflux diet.  Eats spaghetti, sweet drinks, spices, and fried foods.   Wt Readings from Last 3 Encounters:  10/06/16 191 lb 9.6 oz (86.9 kg)  09/08/16 191 lb (86.6 kg)  08/19/16 188 lb 12.8 oz (85.6 kg)     Patient Active Problem List   Diagnosis Date Noted  . Stress incontinence 11/12/2014  . ADD (attention deficit disorder) 11/12/2014  . Back pain 10/07/2012  . Endometriosis 10/07/2012  . Foot pain, bilateral 10/07/2012  . Stomach pain 10/07/2012    Past Medical History:  Diagnosis Date  . ADD (attention deficit disorder)   . Allergy   . Anemia   . Attention deficit disorder (ADD)   . Depression   . Endometriosis     Past Surgical History:  Procedure Laterality Date  . CESAREAN SECTION  2004, 2008  . CESAREAN SECTION    . CHOLECYSTECTOMY  2004  . CHOLECYSTECTOMY    . LAPAROSCOPY  2002   endometriosis  . TUBAL LIGATION  2008  . TUBAL LIGATION      Social History  Substance Use Topics  . Smoking status: Never Smoker  . Smokeless tobacco: Never Used  . Alcohol use 0.0 oz/week     Comment: ocassionally    Family  History  Problem Relation Age of Onset  . Hypertension Mother   . Diabetes Mother   . Hypertension Father   . Heart disease Father   . Diabetes Father   . Diabetes Maternal Grandmother     Allergies  Allergen Reactions  . Latex Swelling  . Codeine Nausea And Vomiting  . Codeine Nausea And Vomiting  . Latex     Feels like little cuts in her vagina.    Medication list has been reviewed and updated.  Current Outpatient Prescriptions on File Prior to Visit  Medication Sig Dispense Refill  . albuterol (PROVENTIL HFA;VENTOLIN HFA) 108 (90 Base) MCG/ACT inhaler Inhale 2 puffs into the lungs every 6 (six) hours as needed for wheezing or shortness of breath. 1 Inhaler 0  . albuterol (PROVENTIL) (2.5 MG/3ML) 0.083% nebulizer solution Take 3 mLs (2.5 mg total) by nebulization every 6 (six) hours as needed for wheezing or shortness of breath. 150 mL 1  . Amphet-Dextroamphet 3-Bead ER (MYDAYIS) 25 MG CP24 Take 1 capsule by mouth every morning. 30 capsule 0  . fluticasone (FLONASE) 50 MCG/ACT nasal spray Place 2 sprays into both nostrils daily. 16 g 6  . levonorgestrel (MIRENA) 20 MCG/24HR IUD 1 each by Intrauterine route once.    . pantoprazole (  PROTONIX) 40 MG tablet TAKE 1 TABLET BY MOUTH EVERY DAY 30 tablet 0  . polyethylene glycol (MIRALAX / GLYCOLAX) packet Take 17 g by mouth daily.    . Vitamin D, Ergocalciferol, (DRISDOL) 50000 units CAPS capsule Take 1 capsule (50,000 Units total) by mouth every 7 (seven) days. 30 capsule 0   No current facility-administered medications on file prior to visit.     ROS ROS otherwise unremarkable unless listed above.  Physical Examination: BP 114/77 (BP Location: Right Arm, Patient Position: Sitting, Cuff Size: Large)   Pulse 100   Temp 98.2 F (36.8 C) (Oral)   Resp 16   Ht 4\' 11"  (1.499 m)   Wt 191 lb 9.6 oz (86.9 kg)   SpO2 95%   BMI 38.70 kg/m  Ideal Body Weight: Weight in (lb) to have BMI = 25: 123.5  Physical Exam  Constitutional:  She is oriented to person, place, and time. She appears well-developed and well-nourished. No distress.  HENT:  Head: Normocephalic and atraumatic.  Right Ear: Tympanic membrane, external ear and ear canal normal.  Left Ear: Tympanic membrane, external ear and ear canal normal.  Nose: No mucosal edema or rhinorrhea. Right sinus exhibits no maxillary sinus tenderness and no frontal sinus tenderness. Left sinus exhibits no maxillary sinus tenderness and no frontal sinus tenderness.  Mouth/Throat: No uvula swelling. No oropharyngeal exudate, posterior oropharyngeal edema or posterior oropharyngeal erythema.  Eyes: Pupils are equal, round, and reactive to light. Conjunctivae and EOM are normal.  Cardiovascular: Normal rate and regular rhythm.  Exam reveals no gallop, no distant heart sounds and no friction rub.   No murmur heard. Pulmonary/Chest: Effort normal. No respiratory distress. She has no decreased breath sounds. She has no wheezes. She has no rhonchi.  Lymphadenopathy:       Head (right side): No submandibular, no tonsillar, no preauricular and no posterior auricular adenopathy present.       Head (left side): No submandibular, no tonsillar, no preauricular and no posterior auricular adenopathy present.  Neurological: She is alert and oriented to person, place, and time.  Skin: She is not diaphoretic.  Psychiatric: She has a normal mood and affect. Her behavior is normal.     Assessment and Plan: Clarke Amburn is a 36 y.o. female who is here today for cc of coughing. Her respiratory symptom negative, likely the reflux causing the asthma like symptoms.   Discussed a more strict gerd diet.  Discussed the importance of weight loss.   She voiced understanding.  Adding h2 blocker and refilling the tessalon pearls.  rtc in 2 months if no improvement.  Gastroesophageal reflux disease, esophagitis presence not specified - Plan: benzonatate (TESSALON) 100 MG capsule, cetirizine (ZYRTEC)  10 MG tablet, ranitidine (ZANTAC) 150 MG tablet, pantoprazole (PROTONIX) 40 MG tablet  Uncomplicated asthma, unspecified asthma severity, unspecified whether persistent - Plan: benzonatate (TESSALON) 100 MG capsule, cetirizine (ZYRTEC) 10 MG tablet, ranitidine (ZANTAC) 150 MG tablet, pantoprazole (PROTONIX) 40 MG tablet  Ivar Drape, PA-C [facility] 8/19/20187:30 PM

## 2016-10-06 NOTE — Patient Instructions (Addendum)
We are adding the ranitidine to help with your reflux.   I would like you to take both medications at this time, while managing your diet intake and lifestyle recommendations.  Food Choices for Gastroesophageal Reflux Disease, Adult When you have gastroesophageal reflux disease (GERD), the foods you eat and your eating habits are very important. Choosing the right foods can help ease your discomfort. What guidelines do I need to follow?  Choose fruits, vegetables, whole grains, and low-fat dairy products.  Choose low-fat meat, fish, and poultry.  Limit fats such as oils, salad dressings, butter, nuts, and avocado.  Keep a food diary. This helps you identify foods that cause symptoms.  Avoid foods that cause symptoms. These may be different for everyone.  Eat small meals often instead of 3 large meals a day.  Eat your meals slowly, in a place where you are relaxed.  Limit fried foods.  Cook foods using methods other than frying.  Avoid drinking alcohol.  Avoid drinking large amounts of liquids with your meals.  Avoid bending over or lying down until 2-3 hours after eating. What foods are not recommended? These are some foods and drinks that may make your symptoms worse: Vegetables Tomatoes. Tomato juice. Tomato and spaghetti sauce. Chili peppers. Onion and garlic. Horseradish. Fruits Oranges, grapefruit, and lemon (fruit and juice). Meats High-fat meats, fish, and poultry. This includes hot dogs, ribs, ham, sausage, salami, and bacon. Dairy Whole milk and chocolate milk. Sour cream. Cream. Butter. Ice cream. Cream cheese. Drinks Coffee and tea. Bubbly (carbonated) drinks or energy drinks. Condiments Hot sauce. Barbecue sauce. Sweets/Desserts Chocolate and cocoa. Donuts. Peppermint and spearmint. Fats and Oils High-fat foods. This includes [place] fries and potato chips. Other Vinegar. Strong spices. This includes black pepper, white pepper, red pepper, cayenne, curry  powder, cloves, ginger, and chili powder. The items listed above may not be a complete list of foods and drinks to avoid. Contact your dietitian for more information. This information is not intended to replace advice given to you by your health care provider. Make sure you discuss any questions you have with your health care provider. Document Released: 08/11/2011 Document Revised: 07/18/2015 Document Reviewed: 12/14/2012 Elsevier Interactive Patient Education  2017 [employer].     IF you received an x-ray today, you will receive an invoice from [facility]. Please contact [facility] at [phone] with questions or concerns regarding your invoice.   IF you received labwork today, you will receive an invoice from [facility]. Please contact [facility] at [phone] with questions or concerns regarding your invoice.   Our billing staff will not be able to assist you with questions regarding bills from these companies.  You will be contacted with the lab results as soon as they are available. The fastest way to get your results is to activate your My Chart account. Instructions are located on the last page of this paperwork. If you have not heard from [place] regarding the results in 2 weeks, please contact this office.

## 2016-10-08 ENCOUNTER — Encounter: Payer: Self-pay | Admitting: Family Medicine

## 2016-11-01 ENCOUNTER — Other Ambulatory Visit: Payer: Self-pay | Admitting: Physician Assistant

## 2016-11-01 DIAGNOSIS — J45909 Unspecified asthma, uncomplicated: Secondary | ICD-10-CM

## 2016-11-01 DIAGNOSIS — K219 Gastro-esophageal reflux disease without esophagitis: Secondary | ICD-10-CM

## 2016-11-23 ENCOUNTER — Encounter: Payer: Self-pay | Admitting: Family Medicine

## 2016-11-23 ENCOUNTER — Ambulatory Visit (INDEPENDENT_AMBULATORY_CARE_PROVIDER_SITE_OTHER): Payer: BC Managed Care – PPO | Admitting: Family Medicine

## 2016-11-23 VITALS — BP 120/70 | HR 86 | Temp 98.5°F | Resp 16 | Ht 59.0 in | Wt 188.6 lb

## 2016-11-23 DIAGNOSIS — Z23 Encounter for immunization: Secondary | ICD-10-CM

## 2016-11-23 DIAGNOSIS — G4709 Other insomnia: Secondary | ICD-10-CM

## 2016-11-23 DIAGNOSIS — J45909 Unspecified asthma, uncomplicated: Secondary | ICD-10-CM

## 2016-11-23 DIAGNOSIS — F900 Attention-deficit hyperactivity disorder, predominantly inattentive type: Secondary | ICD-10-CM

## 2016-11-23 DIAGNOSIS — T887XXA Unspecified adverse effect of drug or medicament, initial encounter: Secondary | ICD-10-CM

## 2016-11-23 DIAGNOSIS — K219 Gastro-esophageal reflux disease without esophagitis: Secondary | ICD-10-CM | POA: Diagnosis not present

## 2016-11-23 MED ORDER — AMPHETAMINE-DEXTROAMPHETAMINE 5 MG PO TABS: ORAL_TABLET | ORAL | 0 refills | Status: DC

## 2016-11-23 MED ORDER — ZOLPIDEM TARTRATE 5 MG PO TABS: 2.5000 mg | ORAL_TABLET | Freq: Every evening | ORAL | 0 refills | Status: DC | PRN

## 2016-11-23 MED ORDER — AMPHETAMINE-DEXTROAMPHET ER 20 MG PO CP24: 20.0000 mg | ORAL_CAPSULE | ORAL | 0 refills | Status: DC

## 2016-11-23 MED ORDER — PANTOPRAZOLE SODIUM 40 MG PO TBEC: 40.0000 mg | DELAYED_RELEASE_TABLET | Freq: Every day | ORAL | 3 refills | Status: DC

## 2016-11-23 NOTE — Progress Notes (Signed)
Chief Complaint  Patient presents with  . med check/refills    major drop between 1:30-4:30 and wants adderall back thought she would adjust but hasn't, so sleepy until she feels narcoleptic, Insomnia is bad, she is so sleepy until she wants to cry but at bedtime unable to sleep.   Needs refill on protonix, will buy zyrtec otc its cheaper.      HPI  ADHD Pt reports that in the day time she feels sleep but in the past 3 weeks she has not been sleeping at night She does not get sleepy until 12:30pm. She is still doing the mydayis because there is a lull between 1:30 to 4:30pm and feels narcoleptic.  She would like to get back on adderall because with the mydayis  She has tried zquil and melatonin which does not work very often  GERD She ran out of her reflux medication and immediately started having the cough again with burning in her throat  Asthma She reports that her asthma is good but her reflux is aggravating her cough No wheezing   Past Medical History:  Diagnosis Date  . ADD (attention deficit disorder)   . Allergy   . Anemia   . Attention deficit disorder (ADD)   . Depression   . Endometriosis     Current Outpatient Prescriptions  Medication Sig Dispense Refill  . albuterol (PROVENTIL HFA;VENTOLIN HFA) 108 (90 Base) MCG/ACT inhaler Inhale 2 puffs into the lungs every 6 (six) hours as needed for wheezing or shortness of breath. 1 Inhaler 0  . albuterol (PROVENTIL) (2.5 MG/3ML) 0.083% nebulizer solution Take 3 mLs (2.5 mg total) by nebulization every 6 (six) hours as needed for wheezing or shortness of breath. 150 mL 1  . benzonatate (TESSALON) 100 MG capsule Take 1-2 capsules (100-200 mg total) by mouth 3 (three) times daily as needed for cough. 40 capsule 1  . cetirizine (ZYRTEC) 10 MG tablet Take 1 tablet (10 mg total) by mouth daily. 30 tablet 11  . fluticasone (FLONASE) 50 MCG/ACT nasal spray Place 2 sprays into both nostrils daily. 16 g 6  . levonorgestrel (MIRENA)  20 MCG/24HR IUD 1 each by Intrauterine route once.    . pantoprazole (PROTONIX) 40 MG tablet Take 1 tablet (40 mg total) by mouth daily. 30 tablet 3  . polyethylene glycol (MIRALAX / GLYCOLAX) packet Take 17 g by mouth daily.    . ranitidine (ZANTAC) 150 MG tablet Take 1 tablet (150 mg total) by mouth 2 (two) times daily. Office visit needed for additional refills 60 tablet 0  . Vitamin D, Ergocalciferol, (DRISDOL) 50000 units CAPS capsule Take 1 capsule (50,000 Units total) by mouth every 7 (seven) days. 30 capsule 0  . amphetamine-dextroamphetamine (ADDERALL XR) 20 MG 24 hr capsule Take 1 capsule (20 mg total) by mouth every morning. 30 capsule 0  . amphetamine-dextroamphetamine (ADDERALL) 5 MG tablet Take as needed in the afternoon for ADD 30 tablet 0  . zolpidem (AMBIEN) 5 MG tablet Take 0.5 tablets (2.5 mg total) by mouth at bedtime as needed for sleep. 10 tablet 0   No current facility-administered medications for this visit.     Allergies:  Allergies  Allergen Reactions  . Latex Swelling  . Codeine Nausea And Vomiting  . Codeine Nausea And Vomiting  . Latex     Feels like little cuts in her vagina.    Past Surgical History:  Procedure Laterality Date  . CESAREAN SECTION  2004, 2008  . CESAREAN SECTION    .  CHOLECYSTECTOMY  2004  . CHOLECYSTECTOMY    . LAPAROSCOPY  2002   endometriosis  . TUBAL LIGATION  2008  . TUBAL LIGATION      Social History   Social History  . Marital status: Married    Spouse name: N/A  . Number of children: N/A  . Years of education: N/A   Social History Main Topics  . Smoking status: Never Smoker  . Smokeless tobacco: Never Used  . Alcohol use 0.0 oz/week     Comment: ocassionally  . Drug use: No  . Sexual activity: Yes    Partners: Male    Birth control/ protection: IUD   Other Topics Concern  . None   Social History Narrative   ** Merged History Encounter **        Review of Systems  Constitutional: Negative for chills,  fever and weight loss.  Cardiovascular: Negative for chest pain and palpitations.  Gastrointestinal: Negative for abdominal pain, nausea and vomiting.  Skin: Negative for itching and rash.  Neurological: Negative for dizziness.     Objective: Vitals:   11/23/16 1657  BP: 120/70  Pulse: 86  Resp: 16  Temp: 98.5 F (36.9 C)  TempSrc: Oral  SpO2: 97%  Weight: 188 lb 9.6 oz (85.5 kg)  Height: 4\' 11"  (1.499 m)    Physical Exam  Constitutional: She is oriented to person, place, and time. She appears well-developed and well-nourished.  HENT:  Head: Normocephalic and atraumatic.  Right Ear: External ear normal.  Left Ear: External ear normal.  Mouth/Throat: Oropharynx is clear and moist.  Eyes: Conjunctivae and EOM are normal.  Cardiovascular: Normal rate, regular rhythm and normal heart sounds.   No murmur heard. Pulmonary/Chest: Effort normal and breath sounds normal. No respiratory distress. She has no wheezes. She has no rales.  Abdominal: Soft. Bowel sounds are normal. She exhibits no distension and no mass. There is no tenderness. There is no guarding.  Musculoskeletal: Normal range of motion. She exhibits no edema.  Neurological: She is alert and oriented to person, place, and time. No cranial nerve deficit.  Skin: Skin is warm. Capillary refill takes less than 2 seconds.  Psychiatric: She has a normal mood and affect. Her behavior is normal. Judgment and thought content normal.      Assessment and Plan Sharma was seen today for med check/refills.  Diagnoses and all orders for this visit:  Other insomnia- short course of ambien to help pt resume her sleep after stopping Mydayis which had a surge of stimulant too late in the day -     zolpidem (AMBIEN) 5 MG tablet; Take 0.5 tablets (2.5 mg total) by mouth at bedtime as needed for sleep.  Gastroesophageal reflux disease, esophagitis presence not specified- continue protonix -     pantoprazole (PROTONIX) 40 MG tablet;  Take 1 tablet (40 mg total) by mouth daily.  Uncomplicated asthma, unspecified asthma severity, unspecified whether persistent- flu shot today, continue albuterol, refilled protonix -     pantoprazole (PROTONIX) 40 MG tablet; Take 1 tablet (40 mg total) by mouth daily.  Medication side effect- stopped mydayis and resumed adderall xr with immediate release adderall 5mg  for evening work  Need for vaccination -     Flu Vaccine QUAD 36+ mos IM  Attention deficit hyperactivity disorder (ADHD), predominantly inattentive type- changed therapy from mydayis to adderall xr + adderall immediate release  Other orders -     Tdap vaccine greater than or equal to 7yo IM -  amphetamine-dextroamphetamine (ADDERALL) 5 MG tablet; Take as needed in the afternoon for ADD -     amphetamine-dextroamphetamine (ADDERALL XR) 20 MG 24 hr capsule; Take 1 capsule (20 mg total) by mouth every morning.     [place]

## 2016-11-23 NOTE — Patient Instructions (Addendum)
IF you received an x-ray today, you will receive an invoice from [facility]. Please contact [facility] at [phone] with questions or concerns regarding your invoice.   IF you received labwork today, you will receive an invoice from [place]. Please contact [facility] at [phone] with questions or concerns regarding your invoice.   Our billing staff will not be able to assist you with questions regarding bills from these companies.  You will be contacted with the lab results as soon as they are available. The fastest way to get your results is to activate your My Chart account. Instructions are located on the last page of this paperwork. If you have not heard from [place] regarding the results in 2 weeks, please contact this office.     Asthma Attack Prevention, Adult Although you may not be able to control the fact that you have asthma, you can take actions to prevent episodes of asthma (asthma attacks). These actions include:  Creating a written plan for managing and treating your asthma attacks (asthma action plan).  Monitoring your asthma.  Avoiding things that can irritate your airways or make your asthma symptoms worse (asthma triggers).  Taking your medicines as directed.  Acting quickly if you have signs or symptoms of an asthma attack.  What are some ways to prevent an asthma attack? Create a plan Work with your health care provider to create an asthma action plan. This plan should include:  A list of your asthma triggers and how to avoid them.  A list of symptoms that you experience during an asthma attack.  Information about when to take medicine and how much medicine to take.  Information to help you understand your peak flow measurements.  Contact information for your health care providers.  Daily actions that you can take to control asthma.  Monitor your asthma  To monitor your asthma:  Use your peak flow meter every morning and  every evening for 2-3 weeks. Record the results in a journal. A drop in your peak flow numbers on one or more days may mean that you are starting to have an asthma attack, even if you are not having symptoms.  When you have asthma symptoms, write them down in a journal.  Avoid asthma triggers  Work with your health care provider to find out what your asthma triggers are. This can be done by:  Being tested for allergies.  Keeping a journal that notes when asthma attacks occur and what may have contributed to them.  Asking your health care provider whether other medical conditions make your asthma worse.  Common asthma triggers include:  Dust.  Smoke. This includes campfire smoke and secondhand smoke from tobacco products.  Pet dander.  Trees, grasses or pollens.  Very cold, dry, or humid air.  Mold.  Foods that contain high amounts of sulfites.  Strong smells.  Engine exhaust and air pollution.  Aerosol sprays and fumes from household cleaners.  Household pests and their droppings, including dust mites and cockroaches.  Certain medicines, including NSAIDs.  Once you have determined your asthma triggers, take steps to avoid them. Depending on your triggers, you may be able to reduce the chance of an asthma attack by:  Keeping your home clean. Have someone dust and vacuum your home for you 1 or 2 times a week. If possible, have them use a high-efficiency particulate arrestance (HEPA) vacuum.  Washing your sheets weekly in hot water.  Using allergy-proof mattress covers and casings  on your bed.  Keeping pets out of your home.  Taking care of mold and water problems in your home.  Avoiding areas where people smoke.  Avoiding using strong perfumes or odor sprays.  Avoid spending a lot of time outdoors when pollen counts are high and on very windy days.  Talking with your health care provider before stopping or starting any new medicines.  Medicines Take  over-the-counter and prescription medicines only as told by your health care provider. Many asthma attacks can be prevented by carefully following your medicine schedule. Taking your medicines correctly is especially important when you cannot avoid certain asthma triggers. Even if you are doing well, do not stop taking your medicine and do not take less medicine. Act quickly If an asthma attack happens, acting quickly can decrease how severe it is and how long it lasts. Take these actions:  Pay attention to your symptoms. If you are coughing, wheezing, or having difficulty breathing, do not wait to see if your symptoms go away on their own. Follow your asthma action plan.  If you have followed your asthma action plan and your symptoms are not improving, call your health care provider or seek immediate medical care at the nearest hospital.  It is important to write down how often you need to use your fast-acting rescue inhaler. You can track how often you use an inhaler in your journal. If you are using your rescue inhaler more often, it may mean that your asthma is not under control. Adjusting your asthma treatment plan may help you to prevent future asthma attacks and help you to gain better control of your condition. How can I prevent an asthma attack when I exercise?  Exercise is a common asthma trigger. To prevent asthma attacks during exercise:  Follow advice from your health care provider about whether you should use your fast-acting inhaler before exercising. Many people with asthma experience exercise-induced bronchoconstriction (EIB). This condition often worsens during vigorous exercise in cold, humid, or dry environments. Usually, people with EIB can stay very active by using a fast-acting inhaler before exercising.  Avoid exercising outdoors in very cold or humid weather.  Avoid exercising outdoors when pollen counts are high.  Warm up and cool down when exercising.  Stop exercising  right away if asthma symptoms start.  Consider taking part in exercises that are less likely to cause asthma symptoms such as:  Indoor swimming.  Biking.  Walking.  Hiking.  Playing football.  This information is not intended to replace advice given to you by your health care provider. Make sure you discuss any questions you have with your health care provider. Document Released: 01/28/2009 Document Revised: 10/11/2015 Document Reviewed: 07/27/2015 Elsevier Interactive Patient Education  2017 [employer].

## 2016-12-02 ENCOUNTER — Other Ambulatory Visit: Payer: Self-pay | Admitting: Physician Assistant

## 2016-12-02 DIAGNOSIS — K219 Gastro-esophageal reflux disease without esophagitis: Secondary | ICD-10-CM

## 2016-12-02 DIAGNOSIS — J45909 Unspecified asthma, uncomplicated: Secondary | ICD-10-CM

## 2017-01-07 ENCOUNTER — Other Ambulatory Visit: Payer: Self-pay

## 2017-01-07 ENCOUNTER — Ambulatory Visit: Payer: BC Managed Care – PPO | Admitting: Family Medicine

## 2017-01-07 ENCOUNTER — Encounter: Payer: Self-pay | Admitting: Family Medicine

## 2017-01-07 VITALS — HR 95 | Resp 16 | Ht 59.0 in | Wt 186.8 lb

## 2017-01-07 DIAGNOSIS — K219 Gastro-esophageal reflux disease without esophagitis: Secondary | ICD-10-CM | POA: Diagnosis not present

## 2017-01-07 DIAGNOSIS — F900 Attention-deficit hyperactivity disorder, predominantly inattentive type: Secondary | ICD-10-CM

## 2017-01-07 DIAGNOSIS — E559 Vitamin D deficiency, unspecified: Secondary | ICD-10-CM | POA: Diagnosis not present

## 2017-01-07 DIAGNOSIS — J45909 Unspecified asthma, uncomplicated: Secondary | ICD-10-CM | POA: Diagnosis not present

## 2017-01-07 DIAGNOSIS — M26609 Unspecified temporomandibular joint disorder, unspecified side: Secondary | ICD-10-CM

## 2017-01-07 MED ORDER — ERGOCALCIFEROL 50 MCG (2000 UT) PO CAPS: 1.0000 | ORAL_CAPSULE | Freq: Every day | ORAL | Status: DC

## 2017-01-07 MED ORDER — AMPHETAMINE-DEXTROAMPHET ER 25 MG PO CP24: 25.0000 mg | ORAL_CAPSULE | ORAL | 0 refills | Status: DC

## 2017-01-07 MED ORDER — AMPHETAMINE-DEXTROAMPHETAMINE 5 MG PO TABS: ORAL_TABLET | ORAL | 0 refills | Status: DC

## 2017-01-07 NOTE — Patient Instructions (Addendum)
IF you received an x-ray today, you will receive an invoice from [facility]. Please contact [facility] at [phone] with questions or concerns regarding your invoice.   IF you received labwork today, you will receive an invoice from [place]. Please contact [facility] at [phone] with questions or concerns regarding your invoice.   Our billing staff will not be able to assist you with questions regarding bills from these companies.  You will be contacted with the lab results as soon as they are available. The fastest way to get your results is to activate your My Chart account. Instructions are located on the last page of this paperwork. If you have not heard from [place] regarding the results in 2 weeks, please contact this office.     Temporomandibular Joint Syndrome Temporomandibular joint (TMJ) syndrome is a condition that affects the joints between your jaw and your skull. The TMJs are located near your ears and allow your jaw to open and close. These joints and the nearby muscles are involved in all movements of the jaw. People with TMJ syndrome have pain in the area of these joints and muscles. Chewing, biting, or other movements of the jaw can be difficult or painful. TMJ syndrome can be caused by various things. In many cases, the condition is mild and goes away within a few weeks. For some people, the condition can become a long-term problem. What are the causes? Possible causes of TMJ syndrome include:  Grinding your teeth or clenching your jaw. Some people do this when they are under stress.  Arthritis.  Injury to the jaw.  Head or neck injury.  Teeth or dentures that are not aligned well.  In some cases, the cause of TMJ syndrome may not be known. What are the signs or symptoms? The most common symptom is an aching pain on the side of the head in the area of the TMJ. Other symptoms may include:  Pain when moving your jaw, such as when chewing or  biting.  Being unable to open your jaw all the way.  Making a clicking sound when you open your mouth.  Headache.  Earache.  Neck or shoulder pain.  How is this diagnosed? Diagnosis can usually be made based on your symptoms, your medical history, and a physical exam. Your health care provider may check the range of motion of your jaw. Imaging tests, such as X-rays or an MRI, are sometimes done. You may need to see your dentist to determine if your teeth and jaw are lined up correctly. How is this treated? TMJ syndrome often goes away on its own. If treatment is needed, the options may include:  Eating soft foods and applying ice or heat.  Medicines to relieve pain or inflammation.  Medicines to relax the muscles.  A splint, bite plate, or mouthpiece to prevent teeth grinding or jaw clenching.  Relaxation techniques or counseling to help reduce stress.  Transcutaneous electrical nerve stimulation (TENS). This helps to relieve pain by applying an electrical current through the skin.  Acupuncture. This is sometimes helpful to relieve pain.  Jaw surgery. This is rarely needed.  Follow these instructions at home:  Take medicines only as directed by your health care provider.  Eat a soft diet if you are having trouble chewing.  Apply ice to the painful area. ? Put ice in a plastic bag. ? Place a towel between your skin and the bag. ? Leave the ice on for 20 minutes, 2-3  times a day.  Apply a warm compress to the painful area as directed.  Massage your jaw area and perform any jaw stretching exercises as recommended by your health care provider.  If you were given a mouthpiece or bite plate, wear it as directed.  Avoid foods that require a lot of chewing. Do not chew gum.  Keep all follow-up visits as directed by your health care provider. This is important. Contact a health care provider if:  You are having trouble eating.  You have new or worsening symptoms. Get  help right away if:  Your jaw locks open or closed. This information is not intended to replace advice given to you by your health care provider. Make sure you discuss any questions you have with your health care provider. Document Released: 11/04/2000 Document Revised: 10/10/2015 Document Reviewed: 09/14/2013 Elsevier Interactive Patient Education  [employer].

## 2017-01-07 NOTE — Progress Notes (Signed)
Chief Complaint  Patient presents with  . f/u from 11/23/2016 visit    insomnia, reflux and asthma uncomplicated    HPI  Follow up from Visit on 11/23/2016       ADHD She is doing better on the adderall xr with adderall immediate release for the afternoons There is still a drop in attention but she still feels better than when she was on Mydayis   GERD She reports that she takes her protonix almost daily but when she misses a dose she cannot sleep due to reflux She cannot tolerate any juices but water She states that she is making better food choices Even though the medication allows her to eat what she wants she still avoid acidic foods Wt Readings from Last 3 Encounters:  01/07/17 186 lb 12.8 oz (84.7 kg)  11/23/16 188 lb 9.6 oz (85.5 kg)  10/06/16 191 lb 9.6 oz (86.9 kg)   Asthma She reports that her asthma has been better since being on the protonix She still carries her rescue inhaler but has not needed She reports that she has not needed her nebulizer machine  Bilateral Ear Pain This morning she woke up with ear pain - bilateral  She states that her pain is present but tolerable She would rate the pain as 5/10 at the worse and currently is 2/10 No tinnitus No nasal congestion but describes ear fullness  Past Medical History:  Diagnosis Date  . ADD (attention deficit disorder)   . Allergy   . Anemia   . Attention deficit disorder (ADD)   . Depression   . Endometriosis     Current Outpatient Medications  Medication Sig Dispense Refill  . albuterol (PROVENTIL HFA;VENTOLIN HFA) 108 (90 Base) MCG/ACT inhaler Inhale 2 puffs into the lungs every 6 (six) hours as needed for wheezing or shortness of breath. 1 Inhaler 0  . albuterol (PROVENTIL) (2.5 MG/3ML) 0.083% nebulizer solution Take 3 mLs (2.5 mg total) by nebulization every 6 (six) hours as needed for wheezing or shortness of breath. 150 mL 1  . amphetamine-dextroamphetamine (ADDERALL XR) 25 MG 24 hr capsule  Take 1 capsule every morning by mouth. 30 capsule 0  . amphetamine-dextroamphetamine (ADDERALL) 5 MG tablet Take as needed in the afternoon for ADD 30 tablet 0  . benzonatate (TESSALON) 100 MG capsule Take 1-2 capsules (100-200 mg total) by mouth 3 (three) times daily as needed for cough. 40 capsule 1  . fluticasone (FLONASE) 50 MCG/ACT nasal spray Place 2 sprays into both nostrils daily. 16 g 6  . levonorgestrel (MIRENA) 20 MCG/24HR IUD 1 each by Intrauterine route once.    . pantoprazole (PROTONIX) 40 MG tablet Take 1 tablet (40 mg total) by mouth daily. 30 tablet 3  . polyethylene glycol (MIRALAX / GLYCOLAX) packet Take 17 g by mouth daily.    . ranitidine (ZANTAC) 150 MG tablet TAKE 1 TABLET BY MOUTH TWICE A DAY 60 tablet 0  . zolpidem (AMBIEN) 5 MG tablet Take 0.5 tablets (2.5 mg total) by mouth at bedtime as needed for sleep. 10 tablet 0  . cetirizine (ZYRTEC) 10 MG tablet Take 1 tablet (10 mg total) by mouth daily. (Patient not taking: Reported on 01/07/2017) 30 tablet 11  . Vitamin D, Ergocalciferol, (DRISDOL) 50000 units CAPS capsule Take 1 capsule (50,000 Units total) by mouth every 7 (seven) days. (Patient not taking: Reported on 01/07/2017) 30 capsule 0   No current facility-administered medications for this visit.     Allergies:  Allergies  Allergen Reactions  . Latex Swelling  . Codeine Nausea And Vomiting  . Codeine Nausea And Vomiting  . Latex     Feels like little cuts in her vagina.    Past Surgical History:  Procedure Laterality Date  . CESAREAN SECTION  2004, 2008  . CESAREAN SECTION    . CHOLECYSTECTOMY  2004  . CHOLECYSTECTOMY    . LAPAROSCOPY  2002   endometriosis  . TUBAL LIGATION  2008  . TUBAL LIGATION      Social History   Socioeconomic History  . Marital status: Married    Spouse name: None  . Number of children: None  . Years of education: None  . Highest education level: None  Social Needs  . Financial resource strain: None  . Food  insecurity - worry: None  . Food insecurity - inability: None  . Transportation needs - medical: None  . Transportation needs - non-medical: None  Occupational History  . None  Tobacco Use  . Smoking status: Never Smoker  . Smokeless tobacco: Never Used  Substance and Sexual Activity  . Alcohol use: Yes    Alcohol/week: 0.0 oz    Comment: ocassionally  . Drug use: No  . Sexual activity: Yes    Partners: Male    Birth control/protection: IUD  Other Topics Concern  . None  Social History Narrative   ** Merged History Encounter **        Family History  Problem Relation Age of Onset  . Hypertension Mother   . Diabetes Mother   . Hypertension Father   . Heart disease Father   . Diabetes Father   . Diabetes Maternal Grandmother      ROS Review of Systems See HPI Constitution: No fevers or chills No malaise No diaphoresis Skin: No rash or itching Eyes: no blurry vision, no double vision GU: no dysuria or hematuria Neuro: no dizziness or headaches    Objective: Vitals:   01/07/17 1201  Pulse: 95  Resp: 16  SpO2: 98%  Weight: 186 lb 12.8 oz (84.7 kg)  Height: 4\' 11"  (1.499 m)    Physical Exam  Constitutional: She is oriented to person, place, and time. She appears well-developed and well-nourished.  HENT:  Head: Normocephalic and atraumatic.  Eyes: Conjunctivae and EOM are normal.  Cardiovascular: Normal rate, regular rhythm and normal heart sounds.  No murmur heard. Pulmonary/Chest: Effort normal and breath sounds normal. No stridor. No respiratory distress.  Neurological: She is alert and oriented to person, place, and time.  Skin: Skin is warm. Capillary refill takes less than 2 seconds.  Psychiatric: She has a normal mood and affect. Her behavior is normal. Judgment and thought content normal.    Assessment and Plan Aimee King was seen today for f/u from 11/23/2016 visit.  Diagnoses and all orders for this visit:  Attention deficit hyperactivity  disorder (ADHD), predominantly inattentive type- increased adderall xr 25mg  to improve duration Immediate release to be able to get work done in the evenings but still get time for rest -     amphetamine-dextroamphetamine (ADDERALL XR) 25 MG 24 hr capsule; Take 1 capsule every morning by mouth. -     amphetamine-dextroamphetamine (ADDERALL) 5 MG tablet; Take as needed in the afternoon for ADD  TMJ dysfunction- advised motrin and avoiding clenching of the jaw   Gastroesophageal reflux disease, esophagitis presence not specified- continue protonix which is helping her asthma  Vitamin D deficiency- continue 2000 IU Of Ergocalciferol   Uncomplicated asthma,  unspecified asthma severity, unspecified whether persistent- improved with protonix       Letonya Mangels A Nolon Rod

## 2017-03-09 ENCOUNTER — Telehealth: Payer: Self-pay | Admitting: Family Medicine

## 2017-03-09 NOTE — Telephone Encounter (Signed)
CRM for notification. See Telephone encounter for: pt needing a reill on adderall 25mg  extended release 5 mg instant release   Pharmacy [facility] [address]   [place] [phone]  03/09/17.   (Copied form CRM)

## 2017-03-09 NOTE — Telephone Encounter (Unsigned)
Copied from [place]. Topic: Quick Communication - See Telephone Encounter >> Mar 09, 2017  2:00 PM Hewitt Shorts wrote: CRM for notification. See Telephone encounter for:  03/09/17.

## 2017-03-11 NOTE — Telephone Encounter (Signed)
Please advise. Dgaddy, CMA 

## 2017-03-13 MED ORDER — AMPHETAMINE-DEXTROAMPHETAMINE 5 MG PO TABS: ORAL_TABLET | ORAL | 0 refills | Status: DC

## 2017-03-13 MED ORDER — AMPHETAMINE-DEXTROAMPHET ER 25 MG PO CP24: 25.0000 mg | ORAL_CAPSULE | ORAL | 0 refills | Status: DC

## 2017-03-13 NOTE — Telephone Encounter (Signed)
Please notify the patient that refill was sent electronically. She needs an appointment in February for more refills.

## 2017-03-16 ENCOUNTER — Ambulatory Visit: Payer: BC Managed Care – PPO | Admitting: Family Medicine

## 2017-03-16 ENCOUNTER — Other Ambulatory Visit: Payer: Self-pay

## 2017-03-16 ENCOUNTER — Encounter: Payer: Self-pay | Admitting: Family Medicine

## 2017-03-16 VITALS — BP 118/82 | HR 67 | Temp 98.8°F | Ht 59.84 in | Wt 184.2 lb

## 2017-03-16 DIAGNOSIS — R6889 Other general symptoms and signs: Secondary | ICD-10-CM | POA: Diagnosis not present

## 2017-03-16 LAB — POC INFLUENZA A&B (BINAX/QUICKVUE)
Influenza A, POC: NEGATIVE
Influenza B, POC: NEGATIVE

## 2017-03-16 MED ORDER — VALACYCLOVIR HCL 1 G PO TABS: 1000.0000 mg | ORAL_TABLET | Freq: Three times a day (TID) | ORAL | 1 refills | Status: DC

## 2017-03-16 NOTE — Progress Notes (Signed)
1/22/201912:40 PM  Aimee King 1980/03/27, 37 y.o. female [id number]  Chief Complaint  Patient presents with  . Influenza    flu ike symptoms    HPI:   Patient is a 37 y.o. female who presents today for 2-3 days of nasal congestion, sore throat, cough, subjective fevers, fatigue, nausea. Denies SOB, vomiting, diarrhea, rashes.   She is also requesting a refill of valtrex. Has not had an outbreak in a long time.  Depression screen [facility] 2/9 03/16/2017 01/07/2017 11/23/2016  Decreased Interest 0 0 0  Down, Depressed, Hopeless 0 0 0  PHQ - 2 Score 0 0 0    Allergies  Allergen Reactions  . Latex Swelling  . Codeine Nausea And Vomiting  . Codeine Nausea And Vomiting  . Latex     Feels like little cuts in her vagina.    Prior to Admission medications   Medication Sig Start Date End Date Taking? Authorizing Provider  albuterol (PROVENTIL HFA;VENTOLIN HFA) 108 (90 Base) MCG/ACT inhaler Inhale 2 puffs into the lungs every 6 (six) hours as needed for wheezing or shortness of breath. 08/19/16  Yes Philis Fendt L, PA-C  albuterol (PROVENTIL) (2.5 MG/3ML) 0.083% nebulizer solution Take 3 mLs (2.5 mg total) by nebulization every 6 (six) hours as needed for wheezing or shortness of breath. 09/08/16  Yes Forrest Moron, MD  cetirizine (ZYRTEC) 10 MG tablet Take 1 tablet (10 mg total) by mouth daily. 10/06/16  Yes Ivar Drape D, PA  Ergocalciferol 2000 units CAPS Take 1 capsule daily by mouth. 01/07/17  Yes Stallings, Zoe A, MD  amphetamine-dextroamphetamine (ADDERALL XR) 25 MG 24 hr capsule Take 1 capsule by mouth every morning. Patient not taking: Reported on 03/16/2017 03/13/17   Forrest Moron, MD  amphetamine-dextroamphetamine (ADDERALL) 5 MG tablet Take as needed in the afternoon for ADD Patient not taking: Reported on 03/16/2017 03/13/17   Forrest Moron, MD  benzonatate (TESSALON) 100 MG capsule Take 1-2 capsules (100-200 mg total) by mouth 3 (three) times daily as  needed for cough. Patient not taking: Reported on 03/16/2017 10/06/16   Ivar Drape D, PA  fluticasone [facility]) 50 MCG/ACT nasal spray Place 2 sprays into both nostrils daily. Patient not taking: Reported on 03/16/2017 02/10/16   Forrest Moron, MD  levonorgestrel (MIRENA) 20 MCG/24HR IUD 1 each by Intrauterine route once.    [provider]  pantoprazole (PROTONIX) 40 MG tablet Take 1 tablet (40 mg total) by mouth daily. Patient not taking: Reported on 03/16/2017 11/23/16   Forrest Moron, MD  polyethylene glycol (MIRALAX / GLYCOLAX) packet Take 17 g by mouth daily.    [provider]  ranitidine (ZANTAC) 150 MG tablet TAKE 1 TABLET BY MOUTH TWICE A DAY Patient not taking: Reported on 03/16/2017 12/03/16   Tereasa Coop, PA-C  Vitamin D, Ergocalciferol, (DRISDOL) 50000 units CAPS capsule Take 1 capsule (50,000 Units total) by mouth every 7 (seven) days. Patient not taking: Reported on 01/07/2017 09/15/16   Forrest Moron, MD  zolpidem (AMBIEN) 5 MG tablet Take 0.5 tablets (2.5 mg total) by mouth at bedtime as needed for sleep. Patient not taking: Reported on 03/16/2017 11/23/16   Forrest Moron, MD    Past Medical History:  Diagnosis Date  . ADD (attention deficit disorder)   . Allergy   . Anemia   . Attention deficit disorder (ADD)   . Depression   . Endometriosis     Past Surgical History:  Procedure Laterality Date  .  CESAREAN SECTION  2004, 2008  . CESAREAN SECTION    . CHOLECYSTECTOMY  2004  . CHOLECYSTECTOMY    . LAPAROSCOPY  2002   endometriosis  . TUBAL LIGATION  2008  . TUBAL LIGATION      Social History   Tobacco Use  . Smoking status: Never Smoker  . Smokeless tobacco: Never Used  Substance Use Topics  . Alcohol use: Yes    Alcohol/week: 0.0 oz    Comment: ocassionally    Family History  Problem Relation Age of Onset  . Hypertension Mother   . Diabetes Mother   . Hypertension Father   . Heart disease Father   . Diabetes  Father   . Diabetes Maternal Grandmother     ROS Per hpi  OBJECTIVE:  Blood pressure 118/82, pulse 67, temperature 98.8 F (37.1 C), temperature source Oral, height 4' 11.84" (1.52 m), weight 184 lb 3.2 oz (83.6 kg), SpO2 99 %.  Physical Exam  Constitutional: She is oriented to person, place, and time and well-developed, well-nourished, and in no distress.  HENT:  Head: Normocephalic and atraumatic.  Right Ear: Hearing, tympanic membrane, external ear and ear canal normal.  Left Ear: Hearing, tympanic membrane, external ear and ear canal normal.  Nose: Mucosal edema and rhinorrhea present. Right sinus exhibits maxillary sinus tenderness (mild). Right sinus exhibits no frontal sinus tenderness. Left sinus exhibits maxillary sinus tenderness (mild). Left sinus exhibits no frontal sinus tenderness.  Mouth/Throat: Oropharynx is clear and moist.  Eyes: EOM are normal. Pupils are equal, round, and reactive to light.  Neck: Neck supple.  Cardiovascular: Normal rate, regular rhythm and normal heart sounds. Exam reveals no gallop and no friction rub.  No murmur heard. Pulmonary/Chest: Effort normal and breath sounds normal. She has no wheezes. She has no rales.  Abdominal: Soft. Bowel sounds are normal. She exhibits no distension. There is tenderness (mild epigasrtic and LUQ). There is no rebound and no guarding.  Lymphadenopathy:    She has no cervical adenopathy.  Neurological: She is alert and oriented to person, place, and time. Gait normal.  Skin: Skin is warm and dry.    Results for orders placed or performed in visit on 03/16/17 (from the past 24 hour(s))  POC Influenza A&B(BINAX/QUICKVUE)     Status: None   Collection Time: 03/16/17 12:15 PM  Result Value Ref Range   Influenza A, POC Negative Negative   Influenza B, POC Negative Negative    ASSESSMENT and PLAN  1. Flu-like symptoms Discussed supportive measuresI: increase hydration, rest, OTC medications, etc. RTC  precautions discussed. - POC Influenza A&B(BINAX/QUICKVUE) - CBC - Comprehensive metabolic panel   Other orders - valACYclovir (VALTREX) 1000 MG tablet; Take 1 tablet (1,000 mg total) by mouth 3 (three) times daily. Start at onset of symptoms  Return if symptoms worsen or fail to improve.    Rutherford Guys, MD [facility] at [place], [place] [address] Ph.  [phone] Fax [phone]

## 2017-03-16 NOTE — Patient Instructions (Signed)
     IF you received an x-ray today, you will receive an invoice from [facility]. Please contact [facility] at [phone] with questions or concerns regarding your invoice.   IF you received labwork today, you will receive an invoice from [facility]. Please contact [facility] at [phone] with questions or concerns regarding your invoice.   Our billing staff will not be able to assist you with questions regarding bills from these companies.  You will be contacted with the lab results as soon as they are available. The fastest way to get your results is to activate your My Chart account. Instructions are located on the last page of this paperwork. If you have not heard from us regarding the results in 2 weeks, please contact this office.     

## 2017-03-17 ENCOUNTER — Other Ambulatory Visit: Payer: Self-pay | Admitting: Physician Assistant

## 2017-03-17 DIAGNOSIS — Z20828 Contact with and (suspected) exposure to other viral communicable diseases: Secondary | ICD-10-CM

## 2017-03-17 LAB — CBC
Hematocrit: 42.7 % (ref 34.0–46.6)
Hemoglobin: 14.3 g/dL (ref 11.1–15.9)
MCH: 29.1 pg (ref 26.6–33.0)
MCHC: 33.5 g/dL (ref 31.5–35.7)
MCV: 87 fL (ref 79–97)
Platelets: 263 10*3/uL (ref 150–379)
RBC: 4.91 x10E6/uL (ref 3.77–5.28)
RDW: 13.9 % (ref 12.3–15.4)
WBC: 7 10*3/uL (ref 3.4–10.8)

## 2017-03-17 LAB — COMPREHENSIVE METABOLIC PANEL
ALT: 21 IU/L (ref 0–32)
AST: 16 IU/L (ref 0–40)
Albumin/Globulin Ratio: 1.3 (ref 1.2–2.2)
Albumin: 4.2 g/dL (ref 3.5–5.5)
Alkaline Phosphatase: 73 IU/L (ref 39–117)
BUN/Creatinine Ratio: 8 — ABNORMAL LOW (ref 9–23)
BUN: 6 mg/dL (ref 6–20)
Bilirubin Total: 0.4 mg/dL (ref 0.0–1.2)
CO2: 21 mmol/L (ref 20–29)
Calcium: 9.3 mg/dL (ref 8.7–10.2)
Chloride: 102 mmol/L (ref 96–106)
Creatinine, Ser: 0.71 mg/dL (ref 0.57–1.00)
GFR calc Af Amer: 128 mL/min/{1.73_m2} (ref >59)
GFR calc non Af Amer: 111 mL/min/{1.73_m2} (ref >59)
Globulin, Total: 3.2 g/dL (ref 1.5–4.5)
Glucose: 95 mg/dL (ref 65–99)
Potassium: 4.3 mmol/L (ref 3.5–5.2)
Sodium: 140 mmol/L (ref 134–144)
Total Protein: 7.4 g/dL (ref 6.0–8.5)

## 2017-03-17 MED ORDER — OSELTAMIVIR PHOSPHATE 75 MG PO CAPS: 75.0000 mg | ORAL_CAPSULE | Freq: Every day | ORAL | 0 refills | Status: AC

## 2017-03-17 NOTE — Progress Notes (Signed)
Treating prophylaxis influenza due to son sick

## 2017-03-27 ENCOUNTER — Encounter: Payer: Self-pay | Admitting: Family Medicine

## 2017-03-29 ENCOUNTER — Encounter: Payer: Self-pay | Admitting: Family Medicine

## 2017-03-29 ENCOUNTER — Ambulatory Visit: Payer: BC Managed Care – PPO | Admitting: Family Medicine

## 2017-03-29 ENCOUNTER — Other Ambulatory Visit: Payer: Self-pay

## 2017-03-29 VITALS — BP 110/70 | HR 99 | Temp 98.8°F | Resp 16 | Ht 60.0 in | Wt 184.4 lb

## 2017-03-29 DIAGNOSIS — R05 Cough: Secondary | ICD-10-CM

## 2017-03-29 DIAGNOSIS — J069 Acute upper respiratory infection, unspecified: Secondary | ICD-10-CM

## 2017-03-29 DIAGNOSIS — R059 Cough, unspecified: Secondary | ICD-10-CM

## 2017-03-29 MED ORDER — BENZONATATE 100 MG PO CAPS: 100.0000 mg | ORAL_CAPSULE | Freq: Three times a day (TID) | ORAL | 1 refills | Status: DC | PRN

## 2017-03-29 MED ORDER — ALBUTEROL SULFATE HFA 108 (90 BASE) MCG/ACT IN AERS: 2.0000 | INHALATION_SPRAY | Freq: Four times a day (QID) | RESPIRATORY_TRACT | 0 refills | Status: DC | PRN

## 2017-03-29 MED ORDER — GUAIFENESIN ER 600 MG PO TB12: 600.0000 mg | ORAL_TABLET | Freq: Two times a day (BID) | ORAL | Status: DC

## 2017-03-29 NOTE — Progress Notes (Signed)
Chief Complaint  Patient presents with  . Cough    x 3 days, mucus is thinning, not as "chunky"  . bodyaches    HPI   Pt reports that she has been coughing for 3 days She states that she has coughing in the mornings and at night Night time symptoms are worse than morning She took mucinex which seems to loosen up her cough No fevers or chills She reports myalgias She works at a school  She states that she has low energy    Past Medical History:  Diagnosis Date  . ADD (attention deficit disorder)   . Allergy   . Anemia   . Attention deficit disorder (ADD)   . Depression   . Endometriosis     Current Outpatient Medications  Medication Sig Dispense Refill  . albuterol (PROVENTIL HFA;VENTOLIN HFA) 108 (90 Base) MCG/ACT inhaler Inhale 2 puffs into the lungs every 6 (six) hours as needed for wheezing or shortness of breath. 1 Inhaler 0  . albuterol (PROVENTIL) (2.5 MG/3ML) 0.083% nebulizer solution Take 3 mLs (2.5 mg total) by nebulization every 6 (six) hours as needed for wheezing or shortness of breath. 150 mL 1  . amphetamine-dextroamphetamine (ADDERALL XR) 25 MG 24 hr capsule Take 1 capsule by mouth every morning. 30 capsule 0  . amphetamine-dextroamphetamine (ADDERALL) 5 MG tablet Take as needed in the afternoon for ADD 30 tablet 0  . benzonatate (TESSALON) 100 MG capsule Take 1-2 capsules (100-200 mg total) by mouth 3 (three) times daily as needed for cough. 40 capsule 1  . cetirizine (ZYRTEC) 10 MG tablet Take 1 tablet (10 mg total) by mouth daily. 30 tablet 11  . guaiFENesin (MUCINEX) 600 MG 12 hr tablet Take 1 tablet (600 mg total) by mouth 2 (two) times daily.    Marland Kitchen levonorgestrel (MIRENA) 20 MCG/24HR IUD 1 each by Intrauterine route once.    . pantoprazole (PROTONIX) 40 MG tablet Take 1 tablet (40 mg total) by mouth daily. 30 tablet 3  . polyethylene glycol (MIRALAX / GLYCOLAX) packet Take 17 g by mouth daily.    . ranitidine (ZANTAC) 150 MG tablet TAKE 1 TABLET BY  MOUTH TWICE A DAY 60 tablet 0  . valACYclovir (VALTREX) 1000 MG tablet Take 1 tablet (1,000 mg total) by mouth 3 (three) times daily. Start at onset of symptoms 21 tablet 1  . Vitamin D, Ergocalciferol, (DRISDOL) 50000 units CAPS capsule Take 1 capsule (50,000 Units total) by mouth every 7 (seven) days. 30 capsule 0  . Ergocalciferol 2000 units CAPS Take 1 capsule daily by mouth. (Patient not taking: Reported on 03/29/2017)    . fluticasone (FLONASE) 50 MCG/ACT nasal spray Place 2 sprays into both nostrils daily. (Patient not taking: Reported on 03/29/2017) 16 g 6  . zolpidem (AMBIEN) 5 MG tablet Take 0.5 tablets (2.5 mg total) by mouth at bedtime as needed for sleep. (Patient not taking: Reported on 03/29/2017) 10 tablet 0   No current facility-administered medications for this visit.     Allergies:  Allergies  Allergen Reactions  . Latex Swelling  . Codeine Nausea And Vomiting  . Codeine Nausea And Vomiting  . Latex     Feels like little cuts in her vagina.    Past Surgical History:  Procedure Laterality Date  . CESAREAN SECTION  2004, 2008  . CESAREAN SECTION    . CHOLECYSTECTOMY  2004  . CHOLECYSTECTOMY    . LAPAROSCOPY  2002   endometriosis  . TUBAL LIGATION  2008  .  TUBAL LIGATION      Social History   Socioeconomic History  . Marital status: Married    Spouse name: None  . Number of children: None  . Years of education: None  . Highest education level: None  Social Needs  . Financial resource strain: None  . Food insecurity - worry: None  . Food insecurity - inability: None  . Transportation needs - medical: None  . Transportation needs - non-medical: None  Occupational History  . None  Tobacco Use  . Smoking status: Never Smoker  . Smokeless tobacco: Never Used  Substance and Sexual Activity  . Alcohol use: Yes    Alcohol/week: 0.0 oz    Comment: ocassionally  . Drug use: No  . Sexual activity: Yes    Partners: Male    Birth control/protection: IUD  Other  Topics Concern  . None  Social History Narrative   ** Merged History Encounter **        Family History  Problem Relation Age of Onset  . Hypertension Mother   . Diabetes Mother   . Hypertension Father   . Heart disease Father   . Diabetes Father   . Diabetes Maternal Grandmother      ROS Review of Systems See HPI Constitution: No fevers or chills No malaise No diaphoresis Skin: No rash or itching Eyes: no blurry vision, no double vision GU: no dysuria or hematuria Neuro: no dizziness or headaches all others reviewed and negative   Objective: Vitals:   03/29/17 1323  BP: 110/70  Pulse: 99  Resp: 16  Temp: 98.8 F (37.1 C)  TempSrc: Oral  SpO2: 99%  Weight: 184 lb 6.4 oz (83.6 kg)  Height: 5' (1.524 m)    Physical Exam General: alert, oriented, in NAD Head: normocephalic, atraumatic, no sinus tenderness Eyes: EOM intact, no scleral icterus or conjunctival injection Ears: TM clear bilaterally Nose: mucosa nonerythematous, nonedematous Throat: no pharyngeal exudate or erythema Lymph: no posterior auricular, submental or cervical lymph adenopathy Heart: normal rate, normal sinus rhythm, no murmurs Lungs: clear to auscultation bilaterally, no wheezing    Assessment and Plan Aimee King was seen today for cough and bodyaches.  Diagnoses and all orders for this visit:  Cough Acute URI -   Advised pt to continue tessalon, albuterol and mucinex Continue zyrtec Continue hydration   Other orders -     albuterol (PROVENTIL HFA;VENTOLIN HFA) 108 (90 Base) MCG/ACT inhaler; Inhale 2 puffs into the lungs every 6 (six) hours as needed for wheezing or shortness of breath. -     benzonatate (TESSALON) 100 MG capsule; Take 1-2 capsules (100-200 mg total) by mouth 3 (three) times daily as needed for cough. -     guaiFENesin (MUCINEX) 600 MG 12 hr tablet; Take 1 tablet (600 mg total) by mouth 2 (two) times daily.     [place]

## 2017-03-29 NOTE — Patient Instructions (Addendum)
     IF you received an x-ray today, you will receive an invoice from [facility]. Please contact [facility] at [phone] with questions or concerns regarding your invoice.   IF you received labwork today, you will receive an invoice from [facility]. Please contact [facility] at [phone] with questions or concerns regarding your invoice.   Our billing staff will not be able to assist you with questions regarding bills from these companies.  You will be contacted with the lab results as soon as they are available. The fastest way to get your results is to activate your My Chart account. Instructions are located on the last page of this paperwork. If you have not heard from us regarding the results in 2 weeks, please contact this office.     Cough, Adult Coughing is a reflex that clears your throat and your airways. Coughing helps to heal and protect your lungs. It is normal to cough occasionally, but a cough that happens with other symptoms or lasts a long time may be a sign of a condition that needs treatment. A cough may last only 2-3 weeks (acute), or it may last longer than 8 weeks (chronic). What are the causes? Coughing is commonly caused by:  Breathing in substances that irritate your lungs.  A viral or bacterial respiratory infection.  Allergies.  Asthma.  Postnasal drip.  Smoking.  Acid backing up from the stomach into the esophagus (gastroesophageal reflux).  Certain medicines.  Chronic lung problems, including COPD (or rarely, lung cancer).  Other medical conditions such as heart failure.  Follow these instructions at home: Pay attention to any changes in your symptoms. Take these actions to help with your discomfort:  Take medicines only as told by your health care provider. ? If you were prescribed an antibiotic medicine, take it as told by your health care provider. Do not stop taking the antibiotic even if you start to feel better. ? Talk  with your health care provider before you take a cough suppressant medicine.  Drink enough fluid to keep your urine clear or pale yellow.  If the air is dry, use a cold steam vaporizer or humidifier in your bedroom or your home to help loosen secretions.  Avoid anything that causes you to cough at work or at home.  If your cough is worse at night, try sleeping in a semi-upright position.  Avoid cigarette smoke. If you smoke, quit smoking. If you need help quitting, ask your health care provider.  Avoid caffeine.  Avoid alcohol.  Rest as needed.  Contact a health care provider if:  You have new symptoms.  You cough up pus.  Your cough does not get better after 2-3 weeks, or your cough gets worse.  You cannot control your cough with suppressant medicines and you are losing sleep.  You develop pain that is getting worse or pain that is not controlled with pain medicines.  You have a fever.  You have unexplained weight loss.  You have night sweats. Get help right away if:  You cough up blood.  You have difficulty breathing.  Your heartbeat is very fast. This information is not intended to replace advice given to you by your health care provider. Make sure you discuss any questions you have with your health care provider. Document Released: 08/08/2010 Document Revised: 07/18/2015 Document Reviewed: 04/18/2014 Elsevier Interactive Patient Education  2018 Elsevier Inc.  

## 2017-04-12 ENCOUNTER — Ambulatory Visit: Payer: BC Managed Care – PPO | Admitting: Family Medicine

## 2017-04-12 ENCOUNTER — Encounter: Payer: Self-pay | Admitting: Family Medicine

## 2017-04-12 ENCOUNTER — Other Ambulatory Visit: Payer: Self-pay

## 2017-04-12 VITALS — BP 110/72 | HR 116 | Temp 99.1°F | Resp 16 | Ht 60.0 in | Wt 185.6 lb

## 2017-04-12 DIAGNOSIS — F900 Attention-deficit hyperactivity disorder, predominantly inattentive type: Secondary | ICD-10-CM | POA: Diagnosis not present

## 2017-04-12 DIAGNOSIS — R05 Cough: Secondary | ICD-10-CM | POA: Diagnosis not present

## 2017-04-12 DIAGNOSIS — R059 Cough, unspecified: Secondary | ICD-10-CM

## 2017-04-12 MED ORDER — AMPHETAMINE-DEXTROAMPHETAMINE 5 MG PO TABS: ORAL_TABLET | ORAL | 0 refills | Status: DC

## 2017-04-12 MED ORDER — AMPHETAMINE-DEXTROAMPHET ER 25 MG PO CP24: 25.0000 mg | ORAL_CAPSULE | ORAL | 0 refills | Status: DC

## 2017-04-12 NOTE — Progress Notes (Signed)
Chief Complaint  Patient presents with  . medication monitoring    all is going well with medications so far per pt    HPI   Pt reports that she is still coughing Denies any improvement in her cough She states that she is taking vitamins and drinking water but does not feel energized She goes to sleep taking melatonin supplements that also has chamomile   Wt Readings from Last 3 Encounters:  04/12/17 185 lb 9.6 oz (84.2 kg)  03/29/17 184 lb 6.4 oz (83.6 kg)  03/16/17 184 lb 3.2 oz (83.6 kg)   ADD Pt has a diagnosis of ADD She is taking Adderall 25mg  in the morning and a 5mg  tab in the evening to finish work at home She does not use other stimulants such as energy drinks or caffeine She is able to sleep at night and denies unintentional weight loss She denies mood swings She skips doses when not working or on weekends    Past Medical History:  Diagnosis Date  . ADD (attention deficit disorder)   . Allergy   . Anemia   . Attention deficit disorder (ADD)   . Depression   . Endometriosis     Current Outpatient Medications  Medication Sig Dispense Refill  . albuterol (PROVENTIL HFA;VENTOLIN HFA) 108 (90 Base) MCG/ACT inhaler Inhale 2 puffs into the lungs every 6 (six) hours as needed for wheezing or shortness of breath. 1 Inhaler 0  . albuterol (PROVENTIL) (2.5 MG/3ML) 0.083% nebulizer solution Take 3 mLs (2.5 mg total) by nebulization every 6 (six) hours as needed for wheezing or shortness of breath. 150 mL 1  . amphetamine-dextroamphetamine (ADDERALL XR) 25 MG 24 hr capsule Take 1 capsule by mouth every morning. 30 capsule 0  . amphetamine-dextroamphetamine (ADDERALL) 5 MG tablet Take as needed in the afternoon for ADD 30 tablet 0  . benzonatate (TESSALON) 100 MG capsule Take 1-2 capsules (100-200 mg total) by mouth 3 (three) times daily as needed for cough. 40 capsule 1  . levonorgestrel (MIRENA) 20 MCG/24HR IUD 1 each by Intrauterine route once.    . pantoprazole  (PROTONIX) 40 MG tablet Take 1 tablet (40 mg total) by mouth daily. 30 tablet 3  . ranitidine (ZANTAC) 150 MG tablet TAKE 1 TABLET BY MOUTH TWICE A DAY 60 tablet 0  . valACYclovir (VALTREX) 1000 MG tablet Take 1 tablet (1,000 mg total) by mouth 3 (three) times daily. Start at onset of symptoms 21 tablet 1  . zolpidem (AMBIEN) 5 MG tablet Take 0.5 tablets (2.5 mg total) by mouth at bedtime as needed for sleep. (Patient not taking: Reported on 03/29/2017) 10 tablet 0   No current facility-administered medications for this visit.     Allergies:  Allergies  Allergen Reactions  . Latex Swelling  . Codeine Nausea And Vomiting  . Codeine Nausea And Vomiting  . Latex     Feels like little cuts in her vagina.    Past Surgical History:  Procedure Laterality Date  . CESAREAN SECTION  2004, 2008  . CESAREAN SECTION    . CHOLECYSTECTOMY  2004  . CHOLECYSTECTOMY    . LAPAROSCOPY  2002   endometriosis  . TUBAL LIGATION  2008  . TUBAL LIGATION      Social History   Socioeconomic History  . Marital status: Married    Spouse name: None  . Number of children: None  . Years of education: None  . Highest education level: None  Social Needs  . Financial  resource strain: None  . Food insecurity - worry: None  . Food insecurity - inability: None  . Transportation needs - medical: None  . Transportation needs - non-medical: None  Occupational History  . None  Tobacco Use  . Smoking status: Never Smoker  . Smokeless tobacco: Never Used  Substance and Sexual Activity  . Alcohol use: Yes    Alcohol/week: 0.0 oz    Comment: ocassionally  . Drug use: No  . Sexual activity: Yes    Partners: Male    Birth control/protection: IUD  Other Topics Concern  . None  Social History Narrative   ** Merged History Encounter **        Family History  Problem Relation Age of Onset  . Hypertension Mother   . Diabetes Mother   . Hypertension Father   . Heart disease Father   . Diabetes Father     . Diabetes Maternal Grandmother      ROS Review of Systems See HPI Constitution: No fevers or chills No malaise No diaphoresis Skin: No rash or itching Eyes: no blurry vision, no double vision GU: no dysuria or hematuria Neuro: no dizziness or headaches  all others reviewed and negative   Objective: Vitals:   04/12/17 1715  BP: 110/72  Pulse: (!) 116  Resp: 16  Temp: 99.1 F (37.3 C)  TempSrc: Oral  SpO2: 99%  Weight: 185 lb 9.6 oz (84.2 kg)  Height: 5' (1.524 m)    Physical Exam  Constitutional: She is oriented to person, place, and time. She appears well-developed and well-nourished.  HENT:  Head: Normocephalic and atraumatic.  Eyes: Conjunctivae and EOM are normal.  Cardiovascular: Normal rate, regular rhythm and normal heart sounds.  No murmur heard. Pulmonary/Chest: Effort normal and breath sounds normal. No stridor. No respiratory distress.  Neurological: She is alert and oriented to person, place, and time.  Skin: Skin is warm. Capillary refill takes less than 2 seconds.  Psychiatric: She has a normal mood and affect. Her behavior is normal. Judgment and thought content normal.    Assessment and Plan Mozella was seen today for medication monitoring.  Diagnoses and all orders for this visit:  Attention deficit hyperactivity disorder (ADHD), predominantly inattentive type-   Refilled medication  dosage and refills were appropriate  Cough- improves with albuterol Likely uri that has aggravated her asthma  Other orders -     Cancel: Pap IG, CT/NG NAA, and HPV (high risk) [facility] -     amphetamine-dextroamphetamine (ADDERALL XR) 25 MG 24 hr capsule; Take 1 capsule by mouth every morning. -     amphetamine-dextroamphetamine (ADDERALL) 5 MG tablet; Take as needed in the afternoon for ADD     [place]

## 2017-04-12 NOTE — Patient Instructions (Addendum)
IF you received an x-ray today, you will receive an invoice from [facility]. Please contact [facility] at [phone] with questions or concerns regarding your invoice.   IF you received labwork today, you will receive an invoice from [facility]. Please contact [facility] at [phone] with questions or concerns regarding your invoice.   Our billing staff will not be able to assist you with questions regarding bills from these companies.  You will be contacted with the lab results as soon as they are available. The fastest way to get your results is to activate your My Chart account. Instructions are located on the last page of this paperwork. If you have not heard from [place] regarding the results in 2 weeks, please contact this office.     Living With Attention Deficit Hyperactivity Disorder If you have been diagnosed with attention deficit hyperactivity disorder (ADHD), you may be relieved that you now know why you have felt or behaved a certain way. Still, you may feel overwhelmed about the treatment ahead. You may also wonder how to get the support you need and how to deal with the condition day-to-day. With treatment and support, you can live with ADHD and manage your symptoms. How to manage lifestyle changes Managing stress Stress is your body's reaction to life changes and events, both good and bad. To cope with the stress of an ADHD diagnosis, it may help to:  Learn more about ADHD.  Exercise regularly. Even a short daily walk can lower stress levels.  Participate in training or education programs (including social skills training classes) that teach you to deal with symptoms.  Medicines Your health care provider may suggest certain medicines if he or she feels that they will help to improve your condition. Stimulant medicines are usually prescribed to treat ADHD, and therapy may also be prescribed. It is important to:  Avoid using alcohol and other substances  that may prevent your medicines from working properly [facility]).  Talk with your pharmacist or health care provider about all the medicines that you take, their possible side effects, and what medicines are safe to take together.  Make it your goal to take part in all treatment decisions (shared decision-making). Ask about possible side effects of medicines that your health care provider recommends, and tell him or her how you feel about having those side effects. It is best if shared decision-making with your health care provider is part of your total treatment plan.  Relationships To strengthen your relationships with family members while treating your condition, consider taking part in family therapy. You might also attend self-help groups alone or with a loved one. Be honest about how your symptoms affect your relationships. Make an effort to communicate respectfully instead of fighting, and find ways to show others that you care. Psychotherapy may be useful in helping you cope with how ADHD affects your relationships. How to recognize changes in your condition The following signs may mean that your treatment is working well and your condition is improving:  Consistently being on time for appointments.  Being more organized at home and work.  Other people noticing improvements in your behavior.  Achieving goals that you set for yourself.  Thinking more clearly.  The following signs may mean that your treatment is not working very well:  Feeling impatience or more confusion.  Missing, forgetting, or being late for appointments.  An increasing sense of disorganization and messiness.  More difficulty in reaching goals that you set for  yourself.  Loved ones becoming angry or frustrated with you.  Where to find support Talking to others  Keep emotion out of important discussions and speak in a calm, logical way.  Listen closely and patiently to your loved ones. Try to  understand their point of view, and try to avoid getting defensive.  Take responsibility for the consequences of your actions.  Ask that others do not take your behaviors personally.  Aim to solve problems as they come up, and express your feelings instead of bottling them up.  Talk openly about what you need from your loved ones and how they can support you.  Consider going to family therapy sessions or having your family meet with a specialist who deals with ADHD-related behavior problems. Finances Not all insurance plans cover mental health care, so it is important to check with your insurance carrier. If paying for co-pays or counseling services is a problem, search for a local or county mental health care center. Public mental health care services may be offered there at a low cost or no cost when you are not able to see a private health care provider. If you are taking medicine for ADHD, you may be able to get the generic form, which may be less expensive than brand-name medicine. Some makers of prescription medicines also offer help to patients who cannot afford the medicines that they need. Follow these instructions at home:  Take over-the-counter and prescription medicines only as told by your health care provider. Check with your health care provider before taking any new medicines.  Create structure and an organized atmosphere at home. For example: ? Make a list of tasks, then rank them from most important to least important. Work on one task at a time until your listed tasks are done. ? Make a daily schedule and follow it consistently every day. ? Use an appointment calendar, and check it 2 or 3 times a day to keep on track. Keep it with you when you leave the house. ? Create spaces where you keep certain things, and always put things back in their places after you use them.  Keep all follow-up visits as told by your health care provider. This is important. Questions to ask your  health care provider:  What are the risks and benefits of taking medicines?  Would I benefit from therapy?  How often should I follow up with a health care provider? Contact a health care provider if:  You have side effects from your medicines, such as: ? Repeated muscle twitches, coughing, or speech outbursts. ? Sleep problems. ? Loss of appetite. ? Depression. ? New or worsening behavior problems. ? Dizziness. ? Unusually fast heartbeat. ? Stomach pains. ? Headaches. Get help right away if:  You have a severe reaction to a medicine.  Your behavior suddenly gets worse. Summary  With treatment and support, you can live with ADHD and manage your symptoms.  The medicines that are most often prescribed for ADHD are stimulants.  Consider taking part in family therapy or self-help groups with family members or friends.  When you talk with friends and family about your ADHD, be patient and communicate openly.  Take over-the-counter and prescription medicines only as told by your health care provider. Check with your health care provider before taking any new medicines. This information is not intended to replace advice given to you by your health care provider. Make sure you discuss any questions you have with your health care provider. Document  Released: 06/11/2016 Document Revised: 06/11/2016 Document Reviewed: 06/11/2016 Elsevier Interactive Patient Education  [employer].

## 2017-05-09 ENCOUNTER — Other Ambulatory Visit: Payer: Self-pay | Admitting: Family Medicine

## 2017-05-20 ENCOUNTER — Other Ambulatory Visit: Payer: Self-pay | Admitting: Physician Assistant

## 2017-05-20 DIAGNOSIS — J45909 Unspecified asthma, uncomplicated: Secondary | ICD-10-CM

## 2017-05-20 DIAGNOSIS — K219 Gastro-esophageal reflux disease without esophagitis: Secondary | ICD-10-CM

## 2017-05-20 NOTE — Telephone Encounter (Signed)
Called patient regarding the pharmacy request for pantoprazole, to make sure that she is still taking this med. No answer. Left message for a call back.

## 2017-05-24 ENCOUNTER — Encounter: Payer: Self-pay | Admitting: Family Medicine

## 2017-06-02 ENCOUNTER — Encounter: Payer: Self-pay | Admitting: Physician Assistant

## 2017-06-17 ENCOUNTER — Other Ambulatory Visit: Payer: Self-pay | Admitting: Family Medicine

## 2017-06-17 DIAGNOSIS — J45909 Unspecified asthma, uncomplicated: Secondary | ICD-10-CM

## 2017-06-17 DIAGNOSIS — K219 Gastro-esophageal reflux disease without esophagitis: Secondary | ICD-10-CM

## 2017-06-24 ENCOUNTER — Telehealth: Payer: Self-pay | Admitting: Family Medicine

## 2017-06-24 NOTE — Telephone Encounter (Signed)
Please advise 

## 2017-06-24 NOTE — Telephone Encounter (Signed)
Copied from [facility] [phone]. Topic: Quick Communication - Rx Refill/Question >> Jun 24, 2017 10:45 AM Percell Belt A wrote: Medication: amphetamine-dextroamphetamine (ADDERALL) 5 MG tablet [[id number]] Has the patient contacted their pharmacy? No  (Agent: If no, request that the patient contact the pharmacy for the refill.) Preferred Pharmacy (with phone number or street name): [facility] [address]  Agent: Please be advised that RX refills may take up to 3 business days. We ask that you follow-up with your pharmacy.

## 2017-06-24 NOTE — Telephone Encounter (Signed)
Refill of Adderall 5mg  and 25mg   LRF of both 04/12/17   #30   0 refills  LOV  04/12/17  Dr. Nolon Rod  [id number] [place], [place] [address]

## 2017-06-25 MED ORDER — AMPHETAMINE-DEXTROAMPHET ER 25 MG PO CP24: 25.0000 mg | ORAL_CAPSULE | ORAL | 0 refills | Status: DC

## 2017-06-25 MED ORDER — AMPHETAMINE-DEXTROAMPHETAMINE 5 MG PO TABS: ORAL_TABLET | ORAL | 0 refills | Status: DC

## 2017-06-25 NOTE — Telephone Encounter (Signed)
Phone call to patient. Patient notified of message from provider. She is appreciative of call.

## 2017-06-25 NOTE — Telephone Encounter (Signed)
Medication refill sent in. Please notify the patient.

## 2017-07-12 ENCOUNTER — Other Ambulatory Visit: Payer: Self-pay | Admitting: Family Medicine

## 2017-07-12 DIAGNOSIS — J45909 Unspecified asthma, uncomplicated: Secondary | ICD-10-CM

## 2017-07-12 DIAGNOSIS — K219 Gastro-esophageal reflux disease without esophagitis: Secondary | ICD-10-CM

## 2017-07-12 NOTE — Telephone Encounter (Signed)
Refill request for pantoprazole 40 mg # 30 with 0 refills approved. Dgaddy, CMA

## 2017-07-29 ENCOUNTER — Ambulatory Visit (INDEPENDENT_AMBULATORY_CARE_PROVIDER_SITE_OTHER): Payer: BC Managed Care – PPO | Admitting: Obstetrics

## 2017-07-29 ENCOUNTER — Encounter: Payer: Self-pay | Admitting: Obstetrics

## 2017-07-29 VITALS — BP 122/85 | Wt 168.8 lb

## 2017-07-29 DIAGNOSIS — Z124 Encounter for screening for malignant neoplasm of cervix: Secondary | ICD-10-CM | POA: Diagnosis not present

## 2017-07-29 DIAGNOSIS — N898 Other specified noninflammatory disorders of vagina: Secondary | ICD-10-CM

## 2017-07-29 DIAGNOSIS — N971 Female infertility of tubal origin: Secondary | ICD-10-CM

## 2017-07-29 DIAGNOSIS — Z113 Encounter for screening for infections with a predominantly sexual mode of transmission: Secondary | ICD-10-CM | POA: Diagnosis not present

## 2017-07-29 DIAGNOSIS — Z01419 Encounter for gynecological examination (general) (routine) without abnormal findings: Secondary | ICD-10-CM

## 2017-07-29 DIAGNOSIS — N393 Stress incontinence (female) (male): Secondary | ICD-10-CM

## 2017-07-29 DIAGNOSIS — Z1151 Encounter for screening for human papillomavirus (HPV): Secondary | ICD-10-CM | POA: Diagnosis not present

## 2017-07-29 DIAGNOSIS — N809 Endometriosis, unspecified: Secondary | ICD-10-CM

## 2017-07-29 DIAGNOSIS — N946 Dysmenorrhea, unspecified: Secondary | ICD-10-CM

## 2017-07-29 MED ORDER — IBUPROFEN 800 MG PO TABS: 800.0000 mg | ORAL_TABLET | Freq: Three times a day (TID) | ORAL | 5 refills | Status: DC | PRN

## 2017-07-29 NOTE — Progress Notes (Signed)
Subjective:        Aimee King is a 37 y.o. female here for a routine exam.  Current complaints: Has had tubal sterilization, and now is in a new relationship and is considering having another baby.  Has a history of endometriosis, and has a Mirena IUD in for severe dysmenorrhea.  Personal health questionnaire:  Is patient Ashkenazi Jewish, have a family history of breast and/or ovarian cancer: no Is there a family history of uterine cancer diagnosed at age < 56, gastrointestinal cancer, urinary tract cancer, family member who is a Field seismologist syndrome-associated carrier: no Is the patient overweight and hypertensive, family history of diabetes, personal history of gestational diabetes, preeclampsia or PCOS: no Is patient over 54, have PCOS,  family history of premature CHD under age 70, diabetes, smoke, have hypertension or peripheral artery disease:  no At any time, has a partner hit, kicked or otherwise hurt or frightened you?: no Over the past 2 weeks, have you felt down, depressed or hopeless?: no Over the past 2 weeks, have you felt little interest or pleasure in doing things?:no   Gynecologic History Patient's last menstrual period was 07/22/2017 (approximate). Contraception: tubal ligation Last Pap: 2016. Results were: normal Last mammogram: n/a. Results were: n/a  Obstetric History OB History  No data available    Past Medical History:  Diagnosis Date  . ADD (attention deficit disorder)   . Allergy   . Anemia   . Attention deficit disorder (ADD)   . Depression   . Endometriosis     Past Surgical History:  Procedure Laterality Date  . CESAREAN SECTION  2004, 2008  . CESAREAN SECTION    . CHOLECYSTECTOMY  2004  . CHOLECYSTECTOMY    . LAPAROSCOPY  2002   endometriosis  . TUBAL LIGATION  2008  . TUBAL LIGATION       Current Outpatient Medications:  .  albuterol (PROVENTIL HFA;VENTOLIN HFA) 108 (90 Base) MCG/ACT inhaler, Inhale 2 puffs into the lungs every  6 (six) hours as needed for wheezing or shortness of breath., Disp: 1 Inhaler, Rfl: 0 .  albuterol (PROVENTIL) (2.5 MG/3ML) 0.083% nebulizer solution, Take 3 mLs (2.5 mg total) by nebulization every 6 (six) hours as needed for wheezing or shortness of breath., Disp: 150 mL, Rfl: 1 .  amphetamine-dextroamphetamine (ADDERALL XR) 25 MG 24 hr capsule, Take 1 capsule by mouth every morning., Disp: 30 capsule, Rfl: 0 .  amphetamine-dextroamphetamine (ADDERALL) 5 MG tablet, Take as needed in the afternoon for ADD, Disp: 30 tablet, Rfl: 0 .  levonorgestrel (MIRENA) 20 MCG/24HR IUD, 1 each by Intrauterine route once., Disp: , Rfl:  .  pantoprazole (PROTONIX) 40 MG tablet, TAKE 1 TABLET BY MOUTH EVERY DAY, Disp: 30 tablet, Rfl: 0 .  ranitidine (ZANTAC) 150 MG tablet, TAKE 1 TABLET BY MOUTH TWICE A DAY, Disp: 60 tablet, Rfl: 0 .  valACYclovir (VALTREX) 1000 MG tablet, Take 1 tablet (1,000 mg total) by mouth 3 (three) times daily. Start at onset of symptoms, Disp: 21 tablet, Rfl: 1 .  benzonatate (TESSALON) 100 MG capsule, Take 1-2 capsules (100-200 mg total) by mouth 3 (three) times daily as needed for cough., Disp: 40 capsule, Rfl: 1 .  ibuprofen (ADVIL,MOTRIN) 800 MG tablet, Take 1 tablet (800 mg total) by mouth every 8 (eight) hours as needed., Disp: 30 tablet, Rfl: 5 .  zolpidem (AMBIEN) 5 MG tablet, Take 0.5 tablets (2.5 mg total) by mouth at bedtime as needed for sleep. (Patient not taking: Reported on  03/29/2017), Disp: 10 tablet, Rfl: 0 Allergies  Allergen Reactions  . Latex Swelling  . Codeine Nausea And Vomiting  . Codeine Nausea And Vomiting  . Latex     Feels like little cuts in her vagina.    Social History   Tobacco Use  . Smoking status: Never Smoker  . Smokeless tobacco: Never Used  Substance Use Topics  . Alcohol use: Yes    Alcohol/week: 0.0 oz    Comment: ocassionally    Family History  Problem Relation Age of Onset  . Hypertension Mother   . Diabetes Mother   . Hypertension  Father   . Heart disease Father   . Diabetes Father   . Diabetes Maternal Grandmother       Review of Systems  Constitutional: negative for fatigue and weight loss Respiratory: negative for cough and wheezing Cardiovascular: negative for chest pain, fatigue and palpitations Gastrointestinal: negative for abdominal pain and change in bowel habits Musculoskeletal:negative for myalgias Neurological: negative for gait problems and tremors Behavioral/Psych: negative for abusive relationship, depression Endocrine: negative for temperature intolerance    Genitourinary:negative for abnormal menstrual periods, genital lesions, hot flashes, sexual problems and vaginal discharge Integument/breast: negative for breast lump, breast tenderness, nipple discharge and skin lesion(s)    Objective:       BP 122/85   Wt 168 lb 12.8 oz (76.6 kg)   LMP 07/22/2017 (Approximate)   BMI 32.97 kg/m  General:   alert  Skin:   no rash or abnormalities  Lungs:   clear to auscultation bilaterally  Heart:   regular rate and rhythm, S1, S2 normal, no murmur, click, rub or gallop  Breasts:   normal without suspicious masses, skin or nipple changes or axillary nodes  Abdomen:  normal findings: no organomegaly, soft, non-tender and no hernia  Pelvis:  External genitalia: normal general appearance Urinary system: urethral meatus normal and bladder without fullness, nontender Vaginal: normal without tenderness, induration or masses Cervix: normal appearance.  IUD string visible and normal length Adnexa: normal bimanual exam Uterus: anteverted and non-tender, normal size   Lab Review Urine pregnancy test Labs reviewed yes Radiologic studies reviewed no  50% of 20 min visit spent on counseling and coordination of care.   Assessment:     1. Encounter for annual routine gynecological examination - doing well  2. Screening for cervical cancer Rx: - Cytology - PAP  3. Vaginal discharge Rx: -  Cervicovaginal ancillary only  4. Dysmenorrhea Rx: - ibuprofen (ADVIL,MOTRIN) 800 MG tablet; Take 1 tablet (800 mg total) by mouth every 8 (eight) hours as needed.  Dispense: 30 tablet; Refill: 5  5. Endometriosis determined by laparoscopy - stable with Mirena IUD  6. Infertility of tubal origin.  Considering re-anastomosis for conception. Rx: - Ambulatory referral to Infertility   7. SUI - stable, with Kegel's and weight loss    Plan:    Education reviewed: calcium supplements, depression evaluation, low fat, low cholesterol diet, safe sex/STD prevention, self breast exams and weight bearing exercise. Follow up in: 1 year.   Meds ordered this encounter  Medications  . ibuprofen (ADVIL,MOTRIN) 800 MG tablet    Sig: Take 1 tablet (800 mg total) by mouth every 8 (eight) hours as needed.    Dispense:  30 tablet    Refill:  5   Orders Placed This Encounter  Procedures  . Ambulatory referral to Infertility    Referral Priority:   Routine    Referral Type:   Consultation  Referral Reason:   Specialty Services Required    Requested Specialty:   Reproductive Endocrinology and Infertility    Number of Visits Requested:   1    Shelly Bombard MD 07-29-2017

## 2017-07-29 NOTE — Progress Notes (Signed)
Patient presents for her Annual Exam today.  LMP: 07/22/2017  Last pap: 03/29/2014    WNL  Contraception: Mirena IUD Inserted 11/26/2014 STD Screening: Declines   CC: Wants to discuss Tubal Reversal

## 2017-07-30 ENCOUNTER — Other Ambulatory Visit: Payer: Self-pay | Admitting: Obstetrics

## 2017-07-30 DIAGNOSIS — N760 Acute vaginitis: Principal | ICD-10-CM

## 2017-07-30 DIAGNOSIS — B9689 Other specified bacterial agents as the cause of diseases classified elsewhere: Secondary | ICD-10-CM

## 2017-07-30 LAB — CERVICOVAGINAL ANCILLARY ONLY
Bacterial vaginitis: POSITIVE — AB
CANDIDA VAGINITIS: NEGATIVE
TRICH (WINDOWPATH): NEGATIVE

## 2017-07-30 MED ORDER — METRONIDAZOLE 500 MG PO TABS: 500.0000 mg | ORAL_TABLET | Freq: Two times a day (BID) | ORAL | 2 refills | Status: DC

## 2017-08-02 ENCOUNTER — Encounter: Payer: Self-pay | Admitting: Obstetrics

## 2017-08-02 ENCOUNTER — Other Ambulatory Visit: Payer: Self-pay | Admitting: Family Medicine

## 2017-08-02 NOTE — Telephone Encounter (Signed)
LOV: 04/12/2017 LAST REFILL 06/25/17

## 2017-08-03 LAB — CYTOLOGY - PAP
DIAGNOSIS: NEGATIVE
HPV: NOT DETECTED

## 2017-08-04 MED ORDER — AMPHETAMINE-DEXTROAMPHET ER 25 MG PO CP24: 25.0000 mg | ORAL_CAPSULE | ORAL | 0 refills | Status: DC

## 2017-08-04 MED ORDER — AMPHETAMINE-DEXTROAMPHETAMINE 5 MG PO TABS: ORAL_TABLET | ORAL | 0 refills | Status: DC

## 2017-08-09 ENCOUNTER — Other Ambulatory Visit: Payer: Self-pay | Admitting: Family Medicine

## 2017-08-09 DIAGNOSIS — J45909 Unspecified asthma, uncomplicated: Secondary | ICD-10-CM

## 2017-08-09 DIAGNOSIS — K219 Gastro-esophageal reflux disease without esophagitis: Secondary | ICD-10-CM

## 2017-08-09 NOTE — Telephone Encounter (Signed)
Refill request for protonix 40 mg #30 with 1 refill approved. Dgaddy, CMA

## 2017-08-11 ENCOUNTER — Other Ambulatory Visit: Payer: Self-pay | Admitting: Family Medicine

## 2017-08-11 NOTE — Telephone Encounter (Signed)
Refill request for albuterol 108 (90) base #1 with 0 refills approved. Dgaddy, CMA

## 2017-08-31 ENCOUNTER — Other Ambulatory Visit: Payer: Self-pay | Admitting: Family Medicine

## 2017-08-31 DIAGNOSIS — K219 Gastro-esophageal reflux disease without esophagitis: Secondary | ICD-10-CM

## 2017-08-31 DIAGNOSIS — J45909 Unspecified asthma, uncomplicated: Secondary | ICD-10-CM

## 2017-09-16 ENCOUNTER — Other Ambulatory Visit: Payer: Self-pay | Admitting: Family Medicine

## 2017-09-16 NOTE — Telephone Encounter (Signed)
Refill request for albuterol mcg/act inhaler #1 0 refills approved. Dgaddy, CMA

## 2017-09-23 ENCOUNTER — Other Ambulatory Visit: Payer: Self-pay | Admitting: Family Medicine

## 2017-09-23 DIAGNOSIS — K219 Gastro-esophageal reflux disease without esophagitis: Secondary | ICD-10-CM

## 2017-09-23 DIAGNOSIS — J45909 Unspecified asthma, uncomplicated: Secondary | ICD-10-CM

## 2017-09-24 MED ORDER — AMPHETAMINE-DEXTROAMPHET ER 25 MG PO CP24: 25.0000 mg | ORAL_CAPSULE | ORAL | 0 refills | Status: DC

## 2017-09-24 MED ORDER — AMPHETAMINE-DEXTROAMPHETAMINE 5 MG PO TABS: ORAL_TABLET | ORAL | 0 refills | Status: DC

## 2017-10-13 ENCOUNTER — Other Ambulatory Visit: Payer: Self-pay | Admitting: Family Medicine

## 2017-10-18 ENCOUNTER — Other Ambulatory Visit: Payer: Self-pay

## 2017-10-18 ENCOUNTER — Ambulatory Visit: Payer: BC Managed Care – PPO | Admitting: Family Medicine

## 2017-10-18 ENCOUNTER — Encounter: Payer: Self-pay | Admitting: Family Medicine

## 2017-10-18 VITALS — BP 127/84 | HR 111 | Temp 98.8°F | Resp 17 | Ht 60.0 in | Wt 168.6 lb

## 2017-10-18 DIAGNOSIS — F900 Attention-deficit hyperactivity disorder, predominantly inattentive type: Secondary | ICD-10-CM | POA: Diagnosis not present

## 2017-10-18 DIAGNOSIS — F411 Generalized anxiety disorder: Secondary | ICD-10-CM

## 2017-10-18 DIAGNOSIS — R42 Dizziness and giddiness: Secondary | ICD-10-CM

## 2017-10-18 DIAGNOSIS — H8113 Benign paroxysmal vertigo, bilateral: Secondary | ICD-10-CM | POA: Diagnosis not present

## 2017-10-18 DIAGNOSIS — B029 Zoster without complications: Secondary | ICD-10-CM | POA: Diagnosis not present

## 2017-10-18 MED ORDER — VALACYCLOVIR HCL 1 G PO TABS: 1000.0000 mg | ORAL_TABLET | Freq: Three times a day (TID) | ORAL | 1 refills | Status: DC

## 2017-10-18 NOTE — Patient Instructions (Addendum)
If you have lab work done today you will be contacted with your lab results within the next 2 weeks.  If you have not heard from [place] then please contact us. The fastest way to get your results is to register for My Chart.   IF you received an x-ray today, you will receive an invoice from [facility]. Please contact [facility] at [phone] with questions or concerns regarding your invoice.   IF you received labwork today, you will receive an invoice from [facility]. Please contact [facility] at [phone] with questions or concerns regarding your invoice.   Our billing staff will not be able to assist you with questions regarding bills from these companies.  You will be contacted with the lab results as soon as they are available. The fastest way to get your results is to activate your My Chart account. Instructions are located on the last page of this paperwork. If you have not heard from [place] regarding the results in 2 weeks, please contact this office.     Shingles Shingles, which is also known as herpes zoster, is an infection that causes a painful skin rash and fluid-filled blisters. Shingles is not related to genital herpes, which is a sexually transmitted infection. Shingles only develops in people who:  Have had chickenpox.  Have received the chickenpox vaccine. (This is rare.)  What are the causes? Shingles is caused by varicella-zoster virus (VZV). This is the same virus that causes chickenpox. After exposure to VZV, the virus stays in the body in an inactive (dormant) state. Shingles develops if the virus reactivates. This can happen many years after the initial exposure to VZV. It is not known what causes this virus to reactivate. What increases the risk? People who have had chickenpox or received the chickenpox vaccine are at risk for shingles. Infection is more common in people who:  Are older than age 54.  Have a weakened defense (immune) system,  such as those with HIV, AIDS, or cancer.  Are taking medicines that weaken the immune system, such as transplant medicines.  Are under great stress.  What are the signs or symptoms? Early symptoms of this condition include itching, tingling, and pain in an area on your skin. Pain may be described as burning, stabbing, or throbbing. A few days or weeks after symptoms start, a painful red rash appears, usually on one side of the body in a bandlike or beltlike pattern. The rash eventually turns into fluid-filled blisters that break open, scab over, and dry up in about 2-3 weeks. At any time during the infection, you may also develop:  A fever.  Chills.  A headache.  An upset stomach.  How is this diagnosed? This condition is diagnosed with a skin exam. Sometimes, skin or fluid samples are taken from the blisters before a diagnosis is made. These samples are examined under a microscope or sent to a lab for testing. How is this treated? There is no specific cure for this condition. Your health care provider will probably prescribe medicines to help you manage pain, recover more quickly, and avoid long-term problems. Medicines may include:  Antiviral drugs.  Anti-inflammatory drugs.  Pain medicines.  If the area involved is on your face, you may be referred to a specialist, such as an eye doctor (ophthalmologist) or an ear, nose, and throat (ENT) doctor to help you avoid eye problems, chronic pain, or disability. Follow these instructions at home: Medicines  Take medicines only as directed  by your health care provider.  Apply an anti-itch or numbing cream to the affected area as directed by your health care provider. Blister and Rash Care  Take a cool bath or apply cool compresses to the area of the rash or blisters as directed by your health care provider. This may help with pain and itching.  Keep your rash covered with a loose bandage (dressing). Wear loose-fitting clothing to  help ease the pain of material rubbing against the rash.  Keep your rash and blisters clean with mild soap and cool water or as directed by your health care provider.  Check your rash every day for signs of infection. These include redness, swelling, and pain that lasts or increases.  Do not pick your blisters.  Do not scratch your rash. General instructions  Rest as directed by your health care provider.  Keep all follow-up visits as directed by your health care provider. This is important.  Until your blisters scab over, your infection can cause chickenpox in people who have never had it or been vaccinated against it. To prevent this from happening, avoid contact with other people, especially: ? Babies. ? Pregnant women. ? Children who have eczema. ? Elderly people who have transplants. ? People who have chronic illnesses, such as leukemia or AIDS. Contact a health care provider if:  Your pain is not relieved with prescribed medicines.  Your pain does not get better after the rash heals.  Your rash looks infected. Signs of infection include redness, swelling, and pain that lasts or increases. Get help right away if:  The rash is on your face or nose.  You have facial pain, pain around your eye area, or loss of feeling on one side of your face.  You have ear pain or you have ringing in your ear.  You have loss of taste.  Your condition gets worse. This information is not intended to replace advice given to you by your health care provider. Make sure you discuss any questions you have with your health care provider. Document Released: 02/09/2005 Document Revised: 10/06/2015 Document Reviewed: 12/21/2013 Elsevier Interactive Patient Education  2018 [employer].

## 2017-10-18 NOTE — Progress Notes (Signed)
Chief Complaint  Patient presents with  . Herpes Zoster    blisters in nose,onset: Sunday 10/17/17  . Dizziness    unsure of onset, but dizziness when turning head left or right to long feels like she is going to pass out    HPI   Anxiety  She reports that she has been having improvement in her anxiety and panic attacks No further acid reflux She states that she left her husband and has been feeling better and does not have as many asthma flares, due to improvement in anxiety  Dizziness She gets episodes of dizziness that comes and goes She reports that she almost fainted when she hugged her brother and he pressed her head into her chest and the turning of her head she almost fainted.  She reports that the episodes are not associated with dehydrations or nausea or new medications.   ADD Pt has a diagnosis of ADD/ADHD Pt is taking adderall 25mg  xr and 5mg  immediate release daily and has good results Pt does not use other stimulants such as energy drinks or caffeine Pt is able to sleep at night and denies unintentional weight loss Pt  denies mood swings Pt skips doses when not working or on weekends  Wt Readings from Last 3 Encounters:  10/18/17 168 lb 9.6 oz (76.5 kg)  07/29/17 168 lb 12.8 oz (76.6 kg)  04/12/17 185 lb 9.6 oz (84.2 kg)   Shingles She reports that she has a shingles rash in her nose She is asking for refill of her valtrex   Past Medical History:  Diagnosis Date  . ADD (attention deficit disorder)   . Allergy   . Anemia   . Attention deficit disorder (ADD)   . Depression   . Endometriosis     Current Outpatient Medications  Medication Sig Dispense Refill  . albuterol (PROVENTIL HFA;VENTOLIN HFA) 108 (90 Base) MCG/ACT inhaler TAKE 2 PUFFS BY MOUTH EVERY 6 HOURS AS NEEDED FOR WHEEZE OR SHORTNESS OF BREATH 8.5 Inhaler 0  . albuterol (PROVENTIL) (2.5 MG/3ML) 0.083% nebulizer solution Take 3 mLs (2.5 mg total) by nebulization every 6 (six) hours as needed  for wheezing or shortness of breath. 150 mL 1  . amphetamine-dextroamphetamine (ADDERALL XR) 25 MG 24 hr capsule Take 1 capsule by mouth every morning. 30 capsule 0  . amphetamine-dextroamphetamine (ADDERALL) 5 MG tablet Take as needed in the afternoon for ADD 30 tablet 0  . benzonatate (TESSALON) 100 MG capsule Take 1-2 capsules (100-200 mg total) by mouth 3 (three) times daily as needed for cough. 40 capsule 1  . ibuprofen (ADVIL,MOTRIN) 800 MG tablet Take 1 tablet (800 mg total) by mouth every 8 (eight) hours as needed. 30 tablet 5  . levonorgestrel (MIRENA) 20 MCG/24HR IUD 1 each by Intrauterine route once.    . ranitidine (ZANTAC) 150 MG tablet TAKE 1 TABLET BY MOUTH TWICE A DAY 60 tablet 0  . valACYclovir (VALTREX) 1000 MG tablet Take 1 tablet (1,000 mg total) by mouth 3 (three) times daily. Start at onset of symptoms 21 tablet 1   No current facility-administered medications for this visit.     Allergies:  Allergies  Allergen Reactions  . Latex Swelling  . Codeine Nausea And Vomiting  . Codeine Nausea And Vomiting  . Latex     Feels like little cuts in her vagina.    Past Surgical History:  Procedure Laterality Date  . CESAREAN SECTION  2004, 2008  . CESAREAN SECTION    .  CHOLECYSTECTOMY  2004  . CHOLECYSTECTOMY    . LAPAROSCOPY  2002   endometriosis  . TUBAL LIGATION  2008  . TUBAL LIGATION      Social History   Socioeconomic History  . Marital status: Married    Spouse name: Not on file  . Number of children: Not on file  . Years of education: Not on file  . Highest education level: Not on file  Occupational History  . Not on file  Social Needs  . Financial resource strain: Not on file  . Food insecurity:    Worry: Not on file    Inability: Not on file  . Transportation needs:    Medical: Not on file    Non-medical: Not on file  Tobacco Use  . Smoking status: Never Smoker  . Smokeless tobacco: Never Used  Substance and Sexual Activity  . Alcohol use:  Yes    Alcohol/week: 0.0 standard drinks    Comment: ocassionally  . Drug use: No  . Sexual activity: Yes    Partners: Male    Birth control/protection: IUD  Lifestyle  . Physical activity:    Days per week: Not on file    Minutes per session: Not on file  . Stress: Not on file  Relationships  . Social connections:    Talks on phone: Not on file    Gets together: Not on file    Attends religious service: Not on file    Active member of club or organization: Not on file    Attends meetings of clubs or organizations: Not on file    Relationship status: Not on file  Other Topics Concern  . Not on file  Social History Narrative   ** Merged History Encounter **        Family History  Problem Relation Age of Onset  . Hypertension Mother   . Diabetes Mother   . Hypertension Father   . Heart disease Father   . Diabetes Father   . Diabetes Maternal Grandmother      ROS Review of Systems See HPI Constitution: No fevers or chills No malaise No diaphoresis Skin: No rash or itching Eyes: no blurry vision, no double vision GU: no dysuria or hematuria Neuro: no dizziness or headaches all others reviewed and negative   Objective: Vitals:   10/18/17 1645  BP: 127/84  Pulse: (!) 111  Resp: 17  Temp: 98.8 F (37.1 C)  TempSrc: Oral  SpO2: 97%  Weight: 168 lb 9.6 oz (76.5 kg)  Height: 5' (1.524 m)   BP Readings from Last 3 Encounters:  10/18/17 127/84  07/29/17 122/85  04/12/17 110/72    Physical Exam  Constitutional: She is oriented to person, place, and time. She appears well-developed and well-nourished.  HENT:  Head: Normocephalic and atraumatic.  Left nare with small blister  Eyes: Conjunctivae and EOM are normal.  Cardiovascular: Normal rate, regular rhythm and normal heart sounds.  No murmur heard. Pulmonary/Chest: Effort normal and breath sounds normal. No stridor. No respiratory distress.  Neurological: She is alert and oriented to person, place, and  time.  Skin: Skin is warm. Capillary refill takes less than 2 seconds.  Psychiatric: She has a normal mood and affect. Her behavior is normal. Judgment and thought content normal.      Assessment and Plan Aimee King was seen today for herpes zoster and dizziness.  Diagnoses and all orders for this visit:  Anxiety - improved   Herpes zoster without complication- valtrex  prescribed  Attention deficit hyperactivity disorder (ADHD), predominantly inattentive type- discussed her ADD She is doing well  Benign paroxysmal positional vertigo due to bilateral vestibular disorder- will refer to PT for future episode -     PT vestibular rehab; Future  Other orders -     valACYclovir (VALTREX) 1000 MG tablet; Take 1 tablet (1,000 mg total) by mouth 3 (three) times daily. Start at onset of symptoms     Dudley Mages A Tamario Heal

## 2017-10-19 ENCOUNTER — Other Ambulatory Visit: Payer: Self-pay | Admitting: Family Medicine

## 2017-10-19 DIAGNOSIS — J45909 Unspecified asthma, uncomplicated: Secondary | ICD-10-CM

## 2017-10-19 DIAGNOSIS — K219 Gastro-esophageal reflux disease without esophagitis: Secondary | ICD-10-CM

## 2017-10-19 NOTE — Telephone Encounter (Signed)
Pantoprazole 40 mg  refill Last Refill: #  Prescription discontinued on 10/18/17 Last OV: 10/18/17 PCP: [place] : [phone]

## 2017-11-03 ENCOUNTER — Other Ambulatory Visit: Payer: Self-pay | Admitting: Family Medicine

## 2017-11-08 MED ORDER — AMPHETAMINE-DEXTROAMPHET ER 25 MG PO CP24: 25.0000 mg | ORAL_CAPSULE | ORAL | 0 refills | Status: DC

## 2017-11-08 MED ORDER — AMPHETAMINE-DEXTROAMPHETAMINE 5 MG PO TABS: ORAL_TABLET | ORAL | 0 refills | Status: DC

## 2017-11-12 ENCOUNTER — Other Ambulatory Visit: Payer: Self-pay | Admitting: Family Medicine

## 2017-11-12 NOTE — Telephone Encounter (Signed)
Albuterol 8.5 refill Last Refill:10/13/17  Last OV: 03/29/17 PCP: Nolon Rod Pharmacy:[facility] [phone]

## 2017-11-30 ENCOUNTER — Other Ambulatory Visit: Payer: Self-pay | Admitting: Family Medicine

## 2018-01-12 ENCOUNTER — Other Ambulatory Visit: Payer: Self-pay | Admitting: Family Medicine

## 2018-01-12 NOTE — Telephone Encounter (Signed)
Patient is requesting a refill of the following medications: Requested Prescriptions   Signed Prescriptions Disp Refills  . amphetamine-dextroamphetamine (ADDERALL XR) 25 MG 24 hr capsule 30 capsule 0    Sig: Take 1 capsule by mouth every morning.    Authorizing Provider: Nolon Rod,  A  . amphetamine-dextroamphetamine (ADDERALL) 5 MG tablet 30 tablet 0    Sig: Take as needed in the afternoon for ADD    Authorizing Provider: Delia Chimes A    Date of patient request: 01/12/18 Last office visit: 10/18/2017 Date of last refill: 11/08/2017 Last refill amount: #30 each script Follow up time period per chart: n/a  Please advise. Dgaddy, CMA

## 2018-01-16 ENCOUNTER — Encounter: Payer: Self-pay | Admitting: Family Medicine

## 2018-01-16 MED ORDER — AMPHETAMINE-DEXTROAMPHET ER 25 MG PO CP24: 25.0000 mg | ORAL_CAPSULE | ORAL | 0 refills | Status: DC

## 2018-01-16 MED ORDER — AMPHETAMINE-DEXTROAMPHETAMINE 5 MG PO TABS: ORAL_TABLET | ORAL | 0 refills | Status: DC

## 2018-01-16 NOTE — Telephone Encounter (Signed)
Refill sent in and patient notified by mychart.

## 2018-03-10 ENCOUNTER — Encounter: Payer: Self-pay | Admitting: Family Medicine

## 2018-03-10 ENCOUNTER — Other Ambulatory Visit: Payer: Self-pay | Admitting: Family Medicine

## 2018-03-11 MED ORDER — AMPHETAMINE-DEXTROAMPHETAMINE 5 MG PO TABS: ORAL_TABLET | ORAL | 0 refills | Status: DC

## 2018-03-11 MED ORDER — AMPHETAMINE-DEXTROAMPHET ER 25 MG PO CP24: 25.0000 mg | ORAL_CAPSULE | ORAL | 0 refills | Status: DC

## 2018-03-11 NOTE — Telephone Encounter (Signed)
Patient is requesting a refill of the following medications: Requested Prescriptions   Pending Prescriptions Disp Refills  . amphetamine-dextroamphetamine (ADDERALL XR) 25 MG 24 hr capsule 30 capsule 0    Sig: Take 1 capsule by mouth every morning.  Marland Kitchen amphetamine-dextroamphetamine (ADDERALL) 5 MG tablet 30 tablet 0    Sig: Take as needed in the afternoon for ADD

## 2018-03-29 ENCOUNTER — Other Ambulatory Visit: Payer: Self-pay | Admitting: Obstetrics

## 2018-03-29 DIAGNOSIS — N760 Acute vaginitis: Principal | ICD-10-CM

## 2018-03-29 DIAGNOSIS — B9689 Other specified bacterial agents as the cause of diseases classified elsewhere: Secondary | ICD-10-CM

## 2018-03-29 MED ORDER — TINIDAZOLE 500 MG PO TABS: 1000.0000 mg | ORAL_TABLET | Freq: Every day | ORAL | 2 refills | Status: DC

## 2018-03-31 ENCOUNTER — Ambulatory Visit (INDEPENDENT_AMBULATORY_CARE_PROVIDER_SITE_OTHER): Payer: BC Managed Care – PPO

## 2018-03-31 ENCOUNTER — Ambulatory Visit: Payer: BC Managed Care – PPO | Admitting: Physician Assistant

## 2018-03-31 ENCOUNTER — Encounter: Payer: Self-pay | Admitting: Physician Assistant

## 2018-03-31 VITALS — BP 124/84 | HR 98 | Temp 98.0°F | Resp 16 | Ht 59.84 in | Wt 178.0 lb

## 2018-03-31 DIAGNOSIS — M25552 Pain in left hip: Secondary | ICD-10-CM | POA: Diagnosis not present

## 2018-03-31 MED ORDER — NAPROXEN 500 MG PO TABS: 500.0000 mg | ORAL_TABLET | Freq: Two times a day (BID) | ORAL | 1 refills | Status: DC

## 2018-03-31 NOTE — Patient Instructions (Addendum)
Your hip x-ray looks great.   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 I am referring you to orthopedics for evaluation and treatment.     In the meantime, start taking Naproxen: 500 mg every 12 hours (or 250 mg every 6 to 8 hours) Try to: ?Minimize stair climbing, walking up hills ?Avoid hip adduction across the midline ?Sit with hips positioned higher than knees; avoid crossing legs while sitting ?Stand with equal weightbearing through lower limbs ?Avoid side-lying to reduce compressive tendon load   Hip Bursitis  Hip bursitis is inflammation of a fluid-filled sac (bursa) in the hip joint. The bursa protects the bones in the hip joint from rubbing against each other. Hip bursitis can cause mild to moderate pain, and symptoms often come and go over time. What are the causes? This condition may be caused by:  Injury to the hip.  Overuse of the muscles that surround the hip joint.  Arthritis or gout.  Diabetes.  Thyroid disease.  Cold weather.  Infection. In some cases, the cause may not be known. What are the signs or symptoms? Symptoms of this condition may include:  Mild or moderate pain in the hip area. Pain may get worse with movement.  Tenderness and swelling of the hip, especially on the outer side of the hip. Symptoms may come and go. If the bursa becomes infected, you may have the following symptoms:  Fever.  Red skin and a feeling of warmth in the hip area. How is this diagnosed? This condition may be diagnosed based on:  A physical exam.  Your medical history.  X-rays.  Removal of fluid from your inflamed bursa for testing (biopsy). You may be sent to a health care provider who specializes in bone diseases (orthopedist) or a provider who specializes in joint inflammation (rheumatologist). How is this treated? This condition is treated by resting, raising (elevating), and applying  pressure(compression) to the injured area. In some cases, this may be enough to make your symptoms go away. Treatment may also include:  Crutches.  Antibiotic medicine.  Draining fluid out of the bursa to help relieve swelling.  Injecting medicine that helps to reduce inflammation (cortisone). Follow these instructions at home: Medicines  Take over-the-counter and prescription medicines only as told by your health care provider.  Do not drive or operate heavy machinery while taking prescription pain medicine, or as told by your health care provider.  If you were prescribed an antibiotic, take it as told by your health care provider. Do not stop taking the antibiotic even if you start to feel better. Activity  Return to your normal activities as told by your health care provider. Ask your health care provider what activities are safe for you.  Rest and protect your hip as much as possible until your pain and swelling get better. General instructions  Wear compression wraps only as told by your health care provider.  Elevate your hip above the level of your heart as much as you can without pain. To do this, try putting a pillow under your hips while you lie down.  Do not use your hip to support your body weight until your health care provider says that you can. Use crutches as told by your health care provider.  Gently massage and stretch your injured area as often as is comfortable.  Keep all follow-up visits as told by your health care provider. This is important. How is this prevented?  Exercise regularly, as told by your health care provider.  Warm up and stretch before being active.  Cool down and stretch after being active.  If an activity irritates your hip or causes pain, avoid the activity as much as possible.  Avoid sitting down for long periods at a time. Contact a health care provider if:  You have a fever.  You develop new symptoms.  You have difficulty  walking or doing everyday activities.  You have pain that gets worse or does not get better with medicine.  You develop red skin or a feeling of warmth in your hip area. Get help right away if:  You cannot move your hip.  You have severe pain. This information is not intended to replace advice given to you by your health care provider. Make sure you discuss any questions you have with your health care provider. Document Released: 08/01/2001 Document Revised: 07/18/2015 Document Reviewed: 09/11/2014 Elsevier Interactive Patient Education  2019 [employer].   IF you received an x-ray today, you will receive an invoice from [facility]. Please contact [facility] at [phone] with questions or concerns regarding your invoice.   IF you received labwork today, you will receive an invoice from [facility]. Please contact [facility] at [phone] with questions or concerns regarding your invoice.   Our billing staff will not be able to assist you with questions regarding bills from these companies.  You will be contacted with the lab results as soon as they are available. The fastest way to get your results is to activate your My Chart account. Instructions are located on the last page of this paperwork. If you have not heard from [place] regarding the results in 2 weeks, please contact this office.

## 2018-03-31 NOTE — Progress Notes (Signed)
Aimee King  MRN: [id number] DOB: 1980/10/30  PCP: Forrest Moron, MD  Subjective:  Pt is a 38 year old female who presents to clinic for left hip pain x 2 months.  Pain is worsening and is now affecting her lifestyle including working out. Pain is described as a "dull ache" of the outside of her hip.  Pain is 2/10 at rest, 5/10 with walking and 8/10 when laying on left side.  Pain sometimes radiates down left thigh and down the knee.  Denies muscle weakness, decreased ROM, n/t, associated back pain, redness, increased warmth.   Review of Systems  Musculoskeletal: Positive for arthralgias (left hip).  Skin: Negative.     Patient Active Problem List   Diagnosis Date Noted  . Stress incontinence 11/12/2014  . ADD (attention deficit disorder) 11/12/2014  . Back pain 10/07/2012  . Endometriosis 10/07/2012  . Foot pain, bilateral 10/07/2012  . Stomach pain 10/07/2012    Current Outpatient Medications on File Prior to Visit  Medication Sig Dispense Refill  . amphetamine-dextroamphetamine (ADDERALL XR) 25 MG 24 hr capsule Take 1 capsule by mouth every morning. 30 capsule 0  . amphetamine-dextroamphetamine (ADDERALL) 5 MG tablet Take as needed in the afternoon for ADD 30 tablet 0  . ibuprofen (ADVIL,MOTRIN) 800 MG tablet Take 1 tablet (800 mg total) by mouth every 8 (eight) hours as needed. 30 tablet 5  . levonorgestrel (MIRENA) 20 MCG/24HR IUD 1 each by Intrauterine route once.    . ranitidine (ZANTAC) 150 MG tablet TAKE 1 TABLET BY MOUTH TWICE A DAY 60 tablet 0  . tinidazole (TINDAMAX) 500 MG tablet Take 2 tablets (1,000 mg total) by mouth daily with breakfast. 10 tablet 2  . valACYclovir (VALTREX) 1000 MG tablet Take 1 tablet (1,000 mg total) by mouth 3 (three) times daily. Start at onset of symptoms 21 tablet 1   No current facility-administered medications on file prior to visit.     Allergies  Allergen Reactions  . Latex Swelling  . Codeine Nausea And Vomiting    . Codeine Nausea And Vomiting  . Latex     Feels like little cuts in her vagina.     Objective:  BP 124/84   Pulse 98   Temp 98 F (36.7 C) (Oral)   Resp 16   Ht 4' 11.84" (1.52 m)   Wt 178 lb (80.7 kg)   SpO2 98%   BMI 34.95 kg/m   Physical Exam Vitals signs reviewed.  Constitutional:      Appearance: Normal appearance.  Musculoskeletal:     Right hip: She exhibits normal range of motion, normal strength, no tenderness and no bony tenderness.     Left hip: She exhibits bony tenderness. She exhibits normal range of motion, normal strength and no crepitus.     Comments: TTP greater trochanter. Positive FABER.    Neurological:     Mental Status: She is alert.    Dg Hip Unilat W Or W/o Pelvis 2-3 Views Left  Result Date: 03/31/2018 CLINICAL DATA:  38 year old female with left lateral hip pain for 2 months. No known injury. EXAM: DG HIP (WITH OR WITHOUT PELVIS) 2-3V LEFT COMPARISON:  Lumbar radiographs 11/12/2014 and abdomen radiograph 11/10/2013. FINDINGS: Chronic IUD. Negative visible lower abdominal and pelvic visceral contours. Chronic pelvic phleboliths greater on the right. Femoral heads are normally located. Hip joint spaces appear symmetric and within normal limits. The proximal left femur and pelvis appear intact. SI joints appear normal. Grossly intact visible proximal  right femur. Unremarkable visible lumbar spine. IMPRESSION: Negative radiographic appearance of the left hip and pelvis. Electronically Signed   By: Genevie Ann M.D.   On: 03/31/2018 17:27    Assessment and Plan :  1. Hip pain, left - Pt presents c/o worsening lateral left hip pain x 2-3 months. X-ray is negative.  HPI and PE suggestive of possible greater trochanteric pain syndrome. Plan to treat with NSAIDs and refer to ortho for eval and treatment. She understands and agrees with plan.  - DG HIP UNILAT W OR W/O PELVIS 2-3 VIEWS LEFT; Future - naproxen (NAPROSYN) 500 MG tablet; Take 1 tablet (500 mg total) by  mouth 2 (two) times daily with a meal.  Dispense: 30 tablet; Refill: 1 - Ambulatory referral to Orthopedic Surgery   Mercer Pod, PA-C  [facility] 03/31/2018 4:55 PM  Please note: Portions of this report may have been transcribed using dragon voice recognition software. Every effort was made to ensure accuracy; however, inadvertent computerized transcription errors may be present.

## 2018-05-03 ENCOUNTER — Other Ambulatory Visit: Payer: Self-pay | Admitting: Family Medicine

## 2018-05-17 ENCOUNTER — Other Ambulatory Visit: Payer: Self-pay | Admitting: Family Medicine

## 2018-05-17 MED ORDER — AMPHETAMINE-DEXTROAMPHET ER 25 MG PO CP24: 25.0000 mg | ORAL_CAPSULE | ORAL | 0 refills | Status: DC

## 2018-05-17 MED ORDER — AMPHETAMINE-DEXTROAMPHETAMINE 5 MG PO TABS: ORAL_TABLET | ORAL | 0 refills | Status: DC

## 2018-07-13 ENCOUNTER — Other Ambulatory Visit: Payer: Self-pay | Admitting: Family Medicine

## 2018-07-13 ENCOUNTER — Encounter: Payer: Self-pay | Admitting: Family Medicine

## 2018-07-14 NOTE — Telephone Encounter (Signed)
Dr stallings advise. 

## 2018-07-15 NOTE — Telephone Encounter (Signed)
Please advise Patient is requesting a refill of the following medications: Requested Prescriptions   Pending Prescriptions Disp Refills  . amphetamine-dextroamphetamine (ADDERALL XR) 25 MG 24 hr capsule 30 capsule 0    Sig: Take 1 capsule by mouth every morning.  Marland Kitchen amphetamine-dextroamphetamine (ADDERALL) 5 MG tablet 30 tablet 0    Sig: Take as needed in the afternoon for ADD

## 2018-07-18 MED ORDER — AMPHETAMINE-DEXTROAMPHET ER 25 MG PO CP24: 25.0000 mg | ORAL_CAPSULE | ORAL | 0 refills | Status: DC

## 2018-07-18 MED ORDER — AMPHETAMINE-DEXTROAMPHETAMINE 5 MG PO TABS: ORAL_TABLET | ORAL | 0 refills | Status: DC

## 2018-08-22 ENCOUNTER — Other Ambulatory Visit: Payer: Self-pay | Admitting: Family Medicine

## 2018-08-29 MED ORDER — AMPHETAMINE-DEXTROAMPHETAMINE 5 MG PO TABS: ORAL_TABLET | ORAL | 0 refills | Status: DC

## 2018-08-29 MED ORDER — AMPHETAMINE-DEXTROAMPHET ER 25 MG PO CP24: 25.0000 mg | ORAL_CAPSULE | ORAL | 0 refills | Status: DC

## 2018-10-06 ENCOUNTER — Other Ambulatory Visit: Payer: Self-pay | Admitting: Family Medicine

## 2018-10-09 NOTE — Telephone Encounter (Signed)
Dr Nolon Rod advise

## 2018-10-10 ENCOUNTER — Encounter: Payer: Self-pay | Admitting: Family Medicine

## 2018-10-10 ENCOUNTER — Ambulatory Visit: Payer: BC Managed Care – PPO | Admitting: Family Medicine

## 2018-10-10 ENCOUNTER — Other Ambulatory Visit: Payer: Self-pay

## 2018-10-10 VITALS — BP 123/86 | HR 94 | Temp 98.4°F | Resp 16 | Ht 59.84 in | Wt 197.4 lb

## 2018-10-10 DIAGNOSIS — F419 Anxiety disorder, unspecified: Secondary | ICD-10-CM

## 2018-10-10 DIAGNOSIS — M545 Low back pain, unspecified: Secondary | ICD-10-CM

## 2018-10-10 DIAGNOSIS — G8929 Other chronic pain: Secondary | ICD-10-CM

## 2018-10-10 DIAGNOSIS — F900 Attention-deficit hyperactivity disorder, predominantly inattentive type: Secondary | ICD-10-CM | POA: Diagnosis not present

## 2018-10-10 DIAGNOSIS — F329 Major depressive disorder, single episode, unspecified: Secondary | ICD-10-CM

## 2018-10-10 MED ORDER — AMPHETAMINE-DEXTROAMPHET ER 25 MG PO CP24: 25.0000 mg | ORAL_CAPSULE | ORAL | 0 refills | Status: DC

## 2018-10-10 MED ORDER — BUPROPION HCL ER (SR) 150 MG PO TB12: 150.0000 mg | ORAL_TABLET | Freq: Two times a day (BID) | ORAL | 3 refills | Status: DC

## 2018-10-10 MED ORDER — AMPHETAMINE-DEXTROAMPHETAMINE 5 MG PO TABS: ORAL_TABLET | ORAL | 0 refills | Status: DC
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
## 2018-10-10 NOTE — Patient Instructions (Signed)
Hiatal Hernia  A hiatal hernia occurs when part of the stomach slides above the muscle that separates the abdomen from the chest (diaphragm). A person can be born with a hiatal hernia (congenital), or it may develop over time. In almost all cases of hiatal hernia, only the top part of the stomach pushes through the diaphragm. Many people have a hiatal hernia with no symptoms. The larger the hernia, the more likely it is that you will have symptoms. In some cases, a hiatal hernia allows stomach acid to flow back into the tube that carries food from your mouth to your stomach (esophagus). This may cause heartburn symptoms. Severe heartburn symptoms may mean that you have developed a condition called gastroesophageal reflux disease (GERD). What are the causes? This condition is caused by a weakness in the opening (hiatus) where the esophagus passes through the diaphragm to attach to the upper part of the stomach. A person may be born with a weakness in the hiatus, or a weakness can develop over time. What increases the risk? This condition is more likely to develop in:  Older people. Age is a major risk factor for a hiatal hernia, especially if you are over the age of 50.  Pregnant women.  People who are overweight.  People who have frequent constipation. What are the signs or symptoms? Symptoms of this condition usually develop in the form of GERD symptoms. Symptoms include:  Heartburn.  Belching.  Indigestion.  Trouble swallowing.  Coughing or wheezing.  Sore throat.  Hoarseness.  Chest pain.  Nausea and vomiting. How is this diagnosed? This condition may be diagnosed during testing for GERD. Tests that may be done include:  X-rays of your stomach or chest.  An upper gastrointestinal (GI) series. This is an X-ray exam of your GI tract that is taken after you swallow a chalky liquid that shows up clearly on the X-ray.  Endoscopy. This is a procedure to look into your stomach  using a thin, flexible tube that has a tiny camera and light on the end of it. How is this treated? This condition may be treated by:  Dietary and lifestyle changes to help reduce GERD symptoms.  Medicines. These may include: ? Over-the-counter antacids. ? Medicines that make your stomach empty more quickly. ? Medicines that block the production of stomach acid (H2 blockers). ? Stronger medicines to reduce stomach acid (proton pump inhibitors).  Surgery to repair the hernia, if other treatments are not helping. If you have no symptoms, you may not need treatment. Follow these instructions at home: Lifestyle and activity  Do not use any products that contain nicotine or tobacco, such as cigarettes and e-cigarettes. If you need help quitting, ask your health care provider.  Try to achieve and maintain a healthy body weight.  Avoid putting pressure on your abdomen. Anything that puts pressure on your abdomen increases the amount of acid that may be pushed up into your esophagus. ? Avoid bending over, especially after eating. ? Raise the head of your bed by putting blocks under the legs. This keeps your head and esophagus higher than your stomach. ? Do not wear tight clothing around your chest or stomach. ? Try not to strain when having a bowel movement, when urinating, or when lifting heavy objects. Eating and drinking  Avoid foods that can worsen GERD symptoms. These may include: ? Fatty foods, like fried foods. ? Citrus fruits, like oranges or lemon. ? Other foods and drinks that contain acid, like   orange juice or tomatoes. ? Spicy food. ? Chocolate.  Eat frequent small meals instead of three large meals a day. This helps prevent your stomach from getting too full. ? Eat slowly. ? Do not lie down right after eating. ? Do not eat 1-2 hours before bed.  Do not drink beverages with caffeine. These include cola, coffee, cocoa, and tea.  Do not drink alcohol. General instructions   Take over-the-counter and prescription medicines only as told by your health care provider.  Keep all follow-up visits as told by your health care provider. This is important. Contact a health care provider if:  Your symptoms are not controlled with medicines or lifestyle changes.  You are having trouble swallowing.  You have coughing or wheezing that will not go away. Get help right away if:  Your pain is getting worse.  Your pain spreads to your arms, neck, jaw, teeth, or back.  You have shortness of breath.  You sweat for no reason.  You feel sick to your stomach (nauseous) or you vomit.  You vomit blood.  You have bright red blood in your stools.  You have black, tarry stools. This information is not intended to replace advice given to you by your health care provider. Make sure you discuss any questions you have with your health care provider. Document Released: 05/02/2003 Document Revised: 01/22/2017 Document Reviewed: 09/14/2016 Elsevier Patient Education  2020 [employer]  

## 2018-10-11 NOTE — Telephone Encounter (Signed)
Addressed in the office

## 2018-11-01 ENCOUNTER — Other Ambulatory Visit: Payer: Self-pay | Admitting: Family Medicine

## 2018-11-01 NOTE — Telephone Encounter (Signed)
Requested medication (s) are due for refill today: yes  Requested medication (s) are on the active medication list: yes  Last refill:  10/10/2018  Future visit scheduled: yes  Notes to clinic:  Requesting 90 day refill   Requested Prescriptions  Pending Prescriptions Disp Refills   buPROPion (WELLBUTRIN SR) 150 MG 12 hr tablet [Pharmacy Med Name: BUPROPION HCL SR 150 MG TABLET] 180 tablet 2    Sig: TAKE 1 TABLET BY MOUTH TWICE A DAY     Psychiatry: Antidepressants - bupropion Failed - 11/01/2018 11:31 AM      Failed - Completed PHQ-2 or PHQ-9 in the last 360 days.      Passed - Last BP in normal range    BP Readings from Last 1 Encounters:  10/10/18 123/86         Passed - Valid encounter within last 6 months    Recent Outpatient Visits          3 weeks ago Chronic bilateral low back pain without sciatica   Primary Care at [facility], Arlie Solomons, MD   7 months ago Hip pain, left   Primary Care at [facility], Gelene Mink, PA-C   1 year ago Herpes zoster without complication   Primary Care at [facility], Arlie Solomons, MD   1 year ago Attention deficit hyperactivity disorder (ADHD), predominantly inattentive type   Primary Care at [facility], Arlie Solomons, MD   1 year ago Cough   [facility] at [place], [place]      Future Appointments            In 1 month Forrest Moron, MD Primary Care at [place], [facility]

## 2018-11-02 ENCOUNTER — Telehealth (INDEPENDENT_AMBULATORY_CARE_PROVIDER_SITE_OTHER): Payer: BC Managed Care – PPO | Admitting: Registered Nurse

## 2018-11-02 ENCOUNTER — Encounter: Payer: Self-pay | Admitting: Registered Nurse

## 2018-11-02 DIAGNOSIS — R6889 Other general symptoms and signs: Secondary | ICD-10-CM | POA: Diagnosis not present

## 2018-11-02 DIAGNOSIS — K219 Gastro-esophageal reflux disease without esophagitis: Secondary | ICD-10-CM | POA: Insufficient documentation

## 2018-11-02 NOTE — Progress Notes (Signed)
Fatigue, no energy and nausea (going out the house or chores suck up the energy) Going on for last 2 days.   No been around anyone with the covid.   Work not has been pended via my chart send out.

## 2018-11-02 NOTE — Patient Instructions (Signed)
° ° ° °  If you have lab work done today you will be contacted with your lab results within the next 2 weeks.  If you have not heard from us then please contact us. The fastest way to get your results is to register for My Chart. ° ° °IF you received an x-ray today, you will receive an invoice from [facility]. Please contact [facility] at [phone] with questions or concerns regarding your invoice.  ° °IF you received labwork today, you will receive an invoice from [facility]. Please contact [facility] at [phone] with questions or concerns regarding your invoice.  ° °Our billing staff will not be able to assist you with questions regarding bills from these companies. ° °You will be contacted with the lab results as soon as they are available. The fastest way to get your results is to activate your My Chart account. Instructions are located on the last page of this paperwork. If you have not heard from us regarding the results in 2 weeks, please contact this office. °  ° ° ° °

## 2018-11-02 NOTE — Progress Notes (Signed)
Telemedicine Encounter- SOAP NOTE Established Patient  This telephone encounter was conducted with the patient's (or proxy's) verbal consent via audio telecommunications: yes  Patient was instructed to have this encounter in a suitably private space; and to only have persons present to whom they give permission to participate. In addition, patient identity was confirmed by use of name plus two identifiers (DOB and address).  I discussed the limitations, risks, security and privacy concerns of performing an evaluation and management service by telephone and the availability of in person appointments. I also discussed with the patient that there may be a patient responsible charge related to this service. The patient expressed understanding and agreed to proceed.  I spent a total of 12 minutes talking with the patient or their proxy.  No chief complaint on file.   Subjective   Aimee King is a 38 y.o. established patient. Telephone visit today for nausea, fatigue, and some muscle aches x 2-3 days  Symptoms onset Monday with aches and fatigue. Fatigue continued. Nausea onset Tuesday night after aches resolved. Feels warm but states she has been afebrile. No headaches, changes to vision, taste, or smell, no vomiting or diarrhea, no changes to color, quality, or quantity of urine or stool, no shob, no cough, denies any other symptoms.  No known sick contacts. 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 Some contact with family members but states none have had known exposure, all are taking proper precautions.   HPI   Patient Active Problem List   Diagnosis Date Noted  . Stress incontinence 11/12/2014  . ADD (attention deficit disorder) 11/12/2014  . Back pain 10/07/2012  . Endometriosis 10/07/2012  . Foot pain, bilateral 10/07/2012  . Stomach pain 10/07/2012    Past Medical History:  Diagnosis Date  . ADD (attention deficit disorder)   . Allergy   . Anemia   . Attention deficit  disorder (ADD)   . Depression   . Endometriosis     Current Outpatient Medications  Medication Sig Dispense Refill  . amphetamine-dextroamphetamine (ADDERALL XR) 25 MG 24 hr capsule Take 1 capsule by mouth every morning. 30 capsule 0  . amphetamine-dextroamphetamine (ADDERALL) 5 MG tablet Take as needed in the afternoon for ADD 30 tablet 0  . buPROPion (WELLBUTRIN SR) 150 MG 12 hr tablet TAKE 1 TABLET BY MOUTH TWICE A DAY 180 tablet 2  . ibuprofen (ADVIL,MOTRIN) 800 MG tablet Take 1 tablet (800 mg total) by mouth every 8 (eight) hours as needed. 30 tablet 5  . levonorgestrel (MIRENA) 20 MCG/24HR IUD 1 each by Intrauterine route once.    . valACYclovir (VALTREX) 1000 MG tablet Take 1 tablet (1,000 mg total) by mouth 3 (three) times daily. Start at onset of symptoms 21 tablet 1   No current facility-administered medications for this visit.     Allergies  Allergen Reactions  . Latex Swelling  . Codeine Nausea And Vomiting  . Codeine Nausea And Vomiting  . Latex     Feels like little cuts in her vagina.    Social History   Socioeconomic History  . Marital status: Married    Spouse name: Not on file  . Number of children: Not on file  . Years of education: Not on file  . Highest education level: Not on file  Occupational History  . Not on file  Social Needs  . Financial resource strain: Not on file  . Food insecurity    Worry: Not on file    Inability:  Not on file  . Transportation needs    Medical: Not on file    Non-medical: Not on file  Tobacco Use  . Smoking status: Never Smoker  . Smokeless tobacco: Never Used  Substance and Sexual Activity  . Alcohol use: Yes    Alcohol/week: 0.0 standard drinks    Comment: ocassionally  . Drug use: No  . Sexual activity: Yes    Partners: Male    Birth control/protection: I.U.D.  Lifestyle  . Physical activity    Days per week: Not on file    Minutes per session: Not on file  . Stress: Not on file  Relationships  . Social  Herbalist on phone: Not on file    Gets together: Not on file    Attends religious service: Not on file    Active member of club or organization: Not on file    Attends meetings of clubs or organizations: Not on file    Relationship status: Not on file  . Intimate partner violence    Fear of current or ex partner: Not on file    Emotionally abused: Not on file    Physically abused: Not on file    Forced sexual activity: Not on file  Other Topics Concern  . Not on file  Social History Narrative   ** Merged History Encounter **        Review of Systems  Constitutional: Negative.  Negative for chills and fever.  HENT: Negative.  Negative for congestion and sinus pain.   Eyes: Negative.   Respiratory: Negative.  Negative for cough and sputum production.   Cardiovascular: Negative.  Negative for chest pain.  Gastrointestinal: Positive for heartburn and nausea. Negative for abdominal pain, diarrhea and vomiting.  Genitourinary: Negative.  Negative for dysuria.  Musculoskeletal: Positive for myalgias (resolved). Negative for joint pain.  Skin: Negative.   Neurological: Negative.   Endo/Heme/Allergies: Negative.   Psychiatric/Behavioral: Negative.   All other systems reviewed and are negative.   Objective   Vitals as reported by the patient: There were no vitals filed for this visit.  Diagnoses and all orders for this visit:  Flu-like symptoms -     Novel Coronavirus, NAA ([facility]); Future   PLAN  Upon further questioning, we elicited that the patient does have a history of GERD and has been on protonix with good effect. Suggest restarting these, as this may be atypical presentation of GERD  Will go to drive thru COVID testing site.   Follow up in one week pending negative covid test and continued symptoms - until then, BRAT diet, rest, no work, work note will be sent.   I discussed the assessment and treatment plan with the patient. The patient was provided  an opportunity to ask questions and all were answered. The patient agreed with the plan and demonstrated an understanding of the instructions.   The patient was advised to call back or seek an in-person evaluation if the symptoms worsen or if the condition fails to improve as anticipated.  I provided 12 minutes of non-face-to-face time during this encounter.  Maximiano Coss, NP  Primary Care at [facility]

## 2018-11-03 ENCOUNTER — Ambulatory Visit: Payer: BC Managed Care – PPO | Admitting: Family Medicine

## 2018-11-03 ENCOUNTER — Other Ambulatory Visit: Payer: Self-pay

## 2018-11-03 DIAGNOSIS — Z20822 Contact with and (suspected) exposure to covid-19: Secondary | ICD-10-CM

## 2018-11-04 LAB — NOVEL CORONAVIRUS, NAA: SARS-CoV-2, NAA: NOT DETECTED

## 2018-11-23 ENCOUNTER — Ambulatory Visit: Payer: Self-pay | Admitting: Registered Nurse

## 2018-11-25 ENCOUNTER — Other Ambulatory Visit: Payer: Self-pay

## 2018-11-25 ENCOUNTER — Telehealth (INDEPENDENT_AMBULATORY_CARE_PROVIDER_SITE_OTHER): Payer: BC Managed Care – PPO | Admitting: Registered Nurse

## 2018-11-25 DIAGNOSIS — J302 Other seasonal allergic rhinitis: Secondary | ICD-10-CM | POA: Diagnosis not present

## 2018-11-25 DIAGNOSIS — K219 Gastro-esophageal reflux disease without esophagitis: Secondary | ICD-10-CM

## 2018-11-25 MED ORDER — FLUTICASONE PROPIONATE 50 MCG/ACT NA SUSP: 2.0000 | Freq: Every day | NASAL | 6 refills | Status: DC

## 2018-11-25 MED ORDER — GUAIFENESIN ER 600 MG PO TB12: 600.0000 mg | ORAL_TABLET | Freq: Two times a day (BID) | ORAL | 3 refills | Status: DC

## 2018-11-25 MED ORDER — CETIRIZINE HCL 10 MG PO TABS: 10.0000 mg | ORAL_TABLET | Freq: Every day | ORAL | 11 refills | Status: DC

## 2018-11-25 MED ORDER — PANTOPRAZOLE SODIUM 40 MG PO TBEC: 40.0000 mg | DELAYED_RELEASE_TABLET | Freq: Every day | ORAL | 1 refills | Status: DC

## 2018-11-25 NOTE — Progress Notes (Signed)
Telemedicine Encounter- SOAP NOTE Established Patient  This telephone encounter was conducted with the patient's (or proxy's) verbal consent via audio telecommunications: yes  Patient was instructed to have this encounter in a suitably private space; and to only have persons present to whom they give permission to participate. In addition, patient identity was confirmed by use of name plus two identifiers (DOB and address).  I discussed the limitations, risks, security and privacy concerns of performing an evaluation and management service by telephone and the availability of in person appointments. I also discussed with the patient that there may be a patient responsible charge related to this service. The patient expressed understanding and agreed to proceed.  I spent a total of 12 minutes talking with the patient or their proxy.  No chief complaint on file.   Subjective   Aimee King is a 38 y.o. established patient. Telephone visit today for flu follow up  HPI Pt states GI symptoms, myalgias, and overall malaise have resolved since last visit. States she is still dealing with some upper respiratory symptoms, including nasal congestion and itching eyes. She states that she consistently gets allergic rhinitis in the fall and usually takes cetirizine and fluticasone nasal spray for these symptoms.  In addition, she states that she has gained weight through COVID, causing her GERD symptoms to resume. She is hoping to restart pantoprazole, as she is still not comfortable going to the gym dT COVID.  Otherwise feels good, much improved since last visit. Has some concern for chronic fatigue - we discussed that this would be best addressed with her PCP Dr. Nolon Rod, with whom she has an upcoming appt.   Patient Active Problem List   Diagnosis Date Noted  . Gastroesophageal reflux disease 11/02/2018  . Flu-like symptoms 11/02/2018  . Stress incontinence 11/12/2014  . ADD  (attention deficit disorder) 11/12/2014  . Back pain 10/07/2012  . Endometriosis 10/07/2012  . Foot pain, bilateral 10/07/2012  . Stomach pain 10/07/2012    Past Medical History:  Diagnosis Date  . ADD (attention deficit disorder)   . Allergy   . Anemia   . Attention deficit disorder (ADD)   . Depression   . Endometriosis     Current Outpatient Medications  Medication Sig Dispense Refill  . amphetamine-dextroamphetamine (ADDERALL XR) 25 MG 24 hr capsule Take 1 capsule by mouth every morning. 30 capsule 0  . amphetamine-dextroamphetamine (ADDERALL) 5 MG tablet Take as needed in the afternoon for ADD 30 tablet 0  . buPROPion (WELLBUTRIN SR) 150 MG 12 hr tablet TAKE 1 TABLET BY MOUTH TWICE A DAY 180 tablet 2  . ibuprofen (ADVIL,MOTRIN) 800 MG tablet Take 1 tablet (800 mg total) by mouth every 8 (eight) hours as needed. 30 tablet 5  . levonorgestrel (MIRENA) 20 MCG/24HR IUD 1 each by Intrauterine route once.    . valACYclovir (VALTREX) 1000 MG tablet Take 1 tablet (1,000 mg total) by mouth 3 (three) times daily. Start at onset of symptoms 21 tablet 1  . cetirizine (ZYRTEC) 10 MG tablet Take 1 tablet (10 mg total) by mouth daily. 30 tablet 11  . fluticasone (FLONASE) 50 MCG/ACT nasal spray Place 2 sprays into both nostrils daily. 16 g 6  . guaiFENesin (MUCINEX) 600 MG 12 hr tablet Take 1 tablet (600 mg total) by mouth 2 (two) times daily. 30 tablet 3   No current facility-administered medications for this visit.     Allergies  Allergen Reactions  . Latex Swelling  . Codeine  Nausea And Vomiting  . Codeine Nausea And Vomiting  . Latex     Feels like little cuts in her vagina.    Social History   Socioeconomic History  . Marital status: Married    Spouse name: Not on file  . Number of children: Not on file  . Years of education: Not on file  . Highest education level: Not on file  Occupational History  . Not on file  Social Needs  . Financial resource strain: Not hard at  all  . Food insecurity    Worry: Never true    Inability: Never true  . Transportation needs    Medical: No    Non-medical: No  Tobacco Use  . Smoking status: Never Smoker  . Smokeless tobacco: Never Used  Substance and Sexual Activity  . Alcohol use: Yes    Alcohol/week: 0.0 standard drinks    Comment: ocassionally  . Drug use: No  . Sexual activity: Yes    Partners: Male    Birth control/protection: I.U.D.  Lifestyle  . Physical activity    Days per week: 3 days    Minutes per session: 30 min  . Stress: Only a little  Relationships  . Social Herbalist on phone: Not on file    Gets together: Not on file    Attends religious service: Not on file    Active member of club or organization: Not on file    Attends meetings of clubs or organizations: Not on file    Relationship status: Not on file  . Intimate partner violence    Fear of current or ex partner: Not on file    Emotionally abused: Not on file    Physically abused: Not on file    Forced sexual activity: Not on file  Other Topics Concern  . Not on file  Social History Narrative   ** Merged History Encounter **        Review of Systems  Constitutional: Negative.   HENT: Positive for congestion and sinus pain.   Eyes: Positive for redness.  Respiratory: Negative.   Cardiovascular: Negative.   Gastrointestinal: Negative.   Genitourinary: Negative.   Musculoskeletal: Negative.   Skin: Negative.   Neurological: Negative.   Endo/Heme/Allergies: Negative.   Psychiatric/Behavioral: Negative.   All other systems reviewed and are negative.   Objective   Vitals as reported by the patient: There were no vitals filed for this visit.  Diagnoses and all orders for this visit:  Seasonal allergies -     guaiFENesin (MUCINEX) 600 MG 12 hr tablet; Take 1 tablet (600 mg total) by mouth 2 (two) times daily. -     fluticasone (FLONASE) 50 MCG/ACT nasal spray; Place 2 sprays into both nostrils daily. -      cetirizine (ZYRTEC) 10 MG tablet; Take 1 tablet (10 mg total) by mouth daily.  Gastroesophageal reflux disease, unspecified whether esophagitis present -     pantoprazole (PROTONIX) 40 MG tablet; Take 1 tablet (40 mg total) by mouth daily.    PLAN  Pantoprazole 40mg  PO qd  Guaifenesin 600mg  tablet, cetirizine, and fluticasone nasal spray for seasonal allergies - these may or may not be covered by her ins or may be cheaper OTC - discussed this with her  Continue follow up as scheduled with her PCP  Patient encouraged to call clinic with any questions, comments, or concerns.    I discussed the assessment and treatment plan with the patient. The  patient was provided an opportunity to ask questions and all were answered. The patient agreed with the plan and demonstrated an understanding of the instructions.   The patient was advised to call back or seek an in-person evaluation if the symptoms worsen or if the condition fails to improve as anticipated.  I provided 12 minutes of non-face-to-face time during this encounter.  Maximiano Coss, NP  Primary Care at [facility]

## 2018-11-25 NOTE — Progress Notes (Signed)
Spoke with pt and she informed me that she just would like to follow-up from her last tele-med. She states she feels better but still having some congestion with some nasal drainage. She states she think its from allergies. She also would like to talk about some acid reflex and thinks she may need to get back on her Protonix.

## 2018-12-09 ENCOUNTER — Ambulatory Visit: Payer: BC Managed Care – PPO | Admitting: Family Medicine

## 2018-12-11 ENCOUNTER — Other Ambulatory Visit: Payer: Self-pay | Admitting: Family Medicine

## 2018-12-16 MED ORDER — AMPHETAMINE-DEXTROAMPHETAMINE 5 MG PO TABS: ORAL_TABLET | ORAL | 0 refills | Status: DC

## 2018-12-16 MED ORDER — AMPHETAMINE-DEXTROAMPHET ER 25 MG PO CP24: 25.0000 mg | ORAL_CAPSULE | ORAL | 0 refills | Status: DC

## 2018-12-26 ENCOUNTER — Ambulatory Visit: Payer: BC Managed Care – PPO | Admitting: Family Medicine

## 2018-12-26 ENCOUNTER — Encounter: Payer: Self-pay | Admitting: Family Medicine

## 2018-12-26 ENCOUNTER — Other Ambulatory Visit: Payer: Self-pay

## 2018-12-26 VITALS — BP 120/81 | HR 95 | Temp 98.2°F | Ht 59.0 in | Wt 192.6 lb

## 2018-12-26 DIAGNOSIS — F419 Anxiety disorder, unspecified: Secondary | ICD-10-CM | POA: Diagnosis not present

## 2018-12-26 DIAGNOSIS — Z23 Encounter for immunization: Secondary | ICD-10-CM | POA: Diagnosis not present

## 2018-12-26 DIAGNOSIS — R198 Other specified symptoms and signs involving the digestive system and abdomen: Secondary | ICD-10-CM

## 2018-12-26 DIAGNOSIS — F900 Attention-deficit hyperactivity disorder, predominantly inattentive type: Secondary | ICD-10-CM

## 2018-12-26 DIAGNOSIS — Z975 Presence of (intrauterine) contraceptive device: Secondary | ICD-10-CM

## 2018-12-26 DIAGNOSIS — K219 Gastro-esophageal reflux disease without esophagitis: Secondary | ICD-10-CM

## 2018-12-26 DIAGNOSIS — N939 Abnormal uterine and vaginal bleeding, unspecified: Secondary | ICD-10-CM

## 2018-12-26 DIAGNOSIS — Z13220 Encounter for screening for lipoid disorders: Secondary | ICD-10-CM

## 2018-12-26 DIAGNOSIS — R109 Unspecified abdominal pain: Secondary | ICD-10-CM

## 2018-12-26 DIAGNOSIS — E559 Vitamin D deficiency, unspecified: Secondary | ICD-10-CM

## 2018-12-26 DIAGNOSIS — F329 Major depressive disorder, single episode, unspecified: Secondary | ICD-10-CM

## 2018-12-26 MED ORDER — PANTOPRAZOLE SODIUM 40 MG PO TBEC: 40.0000 mg | DELAYED_RELEASE_TABLET | Freq: Two times a day (BID) | ORAL | 1 refills | Status: DC

## 2018-12-26 MED ORDER — BUPROPION HCL ER (SR) 150 MG PO TB12: 150.0000 mg | ORAL_TABLET | Freq: Two times a day (BID) | ORAL | 3 refills | Status: DC

## 2018-12-26 NOTE — Patient Instructions (Addendum)
If you have lab work done today you will be contacted with your lab results within the next 2 weeks.  If you have not heard from [place] then please contact us. The fastest way to get your results is to register for My Chart.   IF you received an x-ray today, you will receive an invoice from [facility]. Please contact [facility] at [phone] with questions or concerns regarding your invoice.   IF you received labwork today, you will receive an invoice from [place]. Please contact [facility] at [phone] with questions or concerns regarding your invoice.   Our billing staff will not be able to assist you with questions regarding bills from these companies.  You will be contacted with the lab results as soon as they are available. The fastest way to get your results is to activate your My Chart account. Instructions are located on the last page of this paperwork. If you have not heard from [place] regarding the results in 2 weeks, please contact this office.      Diet for Irritable Bowel Syndrome When you have irritable bowel syndrome (IBS), it is very important to eat the foods and follow the eating habits that are best for your condition. IBS may cause various symptoms such as pain in the abdomen, constipation, or diarrhea. Choosing the right foods can help to ease the discomfort from these symptoms. Work with your health care provider and diet and nutrition specialist (dietitian) to find the eating plan that will help to control your symptoms. What are tips for following this plan?      Keep a food diary. This will help you identify foods that cause symptoms. Write down: ? What you eat and when you eat it. ? What symptoms you have. ? When symptoms occur in relation to your meals, such as "pain in abdomen 2 hours after dinner."  Eat your meals slowly and in a relaxed setting.  Aim to eat 5-6 small meals per day. Do not skip meals.  Drink enough fluid to keep your  urine pale yellow.  Ask your health care provider if you should take an over-the-counter probiotic to help restore healthy bacteria in your gut (digestive tract). ? Probiotics are foods that contain good bacteria and yeasts.  Your dietitian may have specific dietary recommendations for you based on your symptoms. He or she may recommend that you: ? Avoid foods that cause symptoms. Talk with your dietitian about other ways to get the same nutrients that are in those problem foods. ? Avoid foods with gluten. Gluten is a protein that is found in rye, wheat, and barley. ? Eat more foods that contain soluble fiber. Examples of foods with high soluble fiber include oats, seeds, and certain fruits and vegetables. Take a fiber supplement if directed by your dietitian. ? Reduce or avoid certain foods called FODMAPs. These are foods that contain carbohydrates that are hard to digest. Ask your doctor which foods contain these carbohydrates. What foods are not recommended? The following are some foods and drinks that may make your symptoms worse:  Fatty foods, such as french fries.  Foods that contain gluten, such as pasta and cereal.  Dairy products, such as milk, cheese, and ice cream.  Chocolate.  Alcohol.  Products with caffeine, such as coffee.  Carbonated drinks, such as soda.  Foods that are high in FODMAPs. These include certain fruits and vegetables.  Products with sweeteners such as honey, high fructose corn syrup, sorbitol, and  mannitol. The items listed above may not be a complete list of foods and beverages you should avoid. Contact a dietitian for more information. What foods are good sources of fiber? Your health care provider or dietitian may recommend that you eat more foods that contain fiber. Fiber can help to reduce constipation and other IBS symptoms. Add foods with fiber to your diet a little at a time so your body can get used to them. Too much fiber at one time might cause  gas and swelling of your abdomen. The following are some foods that are good sources of fiber:  Berries, such as raspberries, strawberries, and blueberries.  Tomatoes.  Carrots.  Brown rice.  Oats.  Seeds, such as chia and pumpkin seeds. The items listed above may not be a complete list of recommended sources of fiber. Contact your dietitian for more options. Where to find more information  International Foundation for Functional Gastrointestinal Disorders: www.iffgd.[employer] of Diabetes and Digestive and Kidney Diseases: [employer] Summary  When you have irritable bowel syndrome (IBS), it is very important to eat the foods and follow the eating habits that are best for your condition.  IBS may cause various symptoms such as pain in the abdomen, constipation, or diarrhea.  Choosing the right foods can help to ease the discomfort that comes from symptoms.  Keep a food diary. This will help you identify foods that cause symptoms.  Your health care provider or diet and nutrition specialist (dietitian) may recommend that you eat more foods that contain fiber. This information is not intended to replace advice given to you by your health care provider. Make sure you discuss any questions you have with your health care provider. Document Released: 05/02/2003 Document Revised: 06/01/2018 Document Reviewed: 10/13/2016 Elsevier Patient Education  2020 [employer].

## 2018-12-26 NOTE — Progress Notes (Signed)
Established Patient Office Visit  Subjective:  Patient ID: Aimee King, female    DOB: 03/25/1980  Age: 38 y.o. MRN: [id number]  CC:  Chief Complaint  Patient presents with  . 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 She had some stressors at home and her daughter ran away so now she had to make some tough decision.  [facility] 12/26/2018 11/02/2018 10/10/2018 10/18/2017 04/12/2017  Decreased Interest 0 0 2 0 0  Down, Depressed, Hopeless 0 0 2 0 0  PHQ - 2 Score 0 0 4 0 0  Altered sleeping - - 2 - -  Tired, decreased energy - - 3 - -  Change in appetite - - 1 - -  Feeling bad or failure about yourself  - - 1 - -  Trouble concentrating - - 0 - -  Moving slowly or fidgety/restless - - 0 - -  Suicidal thoughts - - 0 - -  PHQ-9 Score - - 11 - -  Difficult doing work/chores - - Somewhat difficult - -   GAD 7 : Generalized Anxiety Score 12/26/2018 10/10/2018  Nervous, Anxious, on Edge 1 2  Control/stop worrying 0 3  Worry too much - different things 0 2  Trouble relaxing 0 2  Restless 0 0  Easily annoyed or irritable 1 1  Afraid - awful might happen 0 0  Total GAD 7 Score 2 10  Anxiety Difficulty Not difficult at all Somewhat difficult   ADD Pt has a diagnosis of ADD Pt is taking Adderall XR daily and has good results but takes breaks on weekends. Pt does not use other stimulants such as energy drinks or caffeine Pt is able to sleep at night and denies unintentional weight loss Pt  denies mood swings Pt skips doses when not working or on weekends Wt Readings from Last 3 Encounters:  12/26/18 192 lb 9.6 oz (87.4 kg)  10/10/18 197 lb 6.4 oz (89.5 kg)  03/31/18 178 lb (80.7 kg)   Digestive Issues She reports that  she has pain when she eats. She eats breakfast and can feel like 10 minutes after eat she has cramping and pain and has to go to the bathroom and can occasionally get diarrhea Junk food does not cause stomach irritation her stomach. She reports that she feels discouraged  She struggles with eating healthy foods because of this She is considering smoothies especially since she prefers liquid She does not wake up at night to have a BM.  She sleeps through the night.   Vaginal Bleeding She reports that she had a regular period for one week and spotting for a month She states that she has been on the Mirena and is now on Mirena #2 [place] She has Mirena for endometriosis and heavy menses No LMP recorded. (Menstrual status: IUD).   Past Medical History:  Diagnosis Date  . ADD (attention deficit disorder)   . Allergy   . Anemia   . Attention deficit disorder (ADD)   . Depression   . Endometriosis     Past Surgical History:  Procedure Laterality Date  . CESAREAN SECTION  2004, 2008  . CESAREAN SECTION    .  CHOLECYSTECTOMY  2004  . CHOLECYSTECTOMY    . LAPAROSCOPY  2002   endometriosis  . TUBAL LIGATION  2008  . TUBAL LIGATION      Family History  Problem Relation Age of Onset  . Hypertension Mother   . Diabetes Mother   . Hypertension Father   . Heart disease Father   . Diabetes Father   . Diabetes Maternal Grandmother     Social History   Socioeconomic History  . Marital status: Married    Spouse name: Not on file  . Number of children: Not on file  . Years of education: Not on file  . Highest education level: Not on file  Occupational History  . Not on file  Social Needs  . Financial resource strain: Not hard at all  . Food insecurity    Worry: Never true    Inability: Never true  . Transportation needs    Medical: No    Non-medical: No  Tobacco Use  . Smoking status: Never Smoker  . Smokeless  tobacco: Never Used  Substance and Sexual Activity  . Alcohol use: Yes    Alcohol/week: 0.0 standard drinks    Comment: ocassionally  . Drug use: No  . Sexual activity: Yes    Partners: Male    Birth control/protection: I.U.D.  Lifestyle  . Physical activity    Days per week: 3 days    Minutes per session: 30 min  . Stress: Only a little  Relationships  . Social Herbalist on phone: Not on file    Gets together: Not on file    Attends religious service: Not on file    Active member of club or organization: Not on file    Attends meetings of clubs or organizations: Not on file    Relationship status: Not on file  . Intimate partner violence    Fear of current or ex partner: Not on file    Emotionally abused: Not on file    Physically abused: Not on file    Forced sexual activity: Not on file  Other Topics Concern  . Not on file  Social History Narrative   ** Merged History Encounter **        Outpatient Medications Prior to Visit  Medication Sig Dispense Refill  . amphetamine-dextroamphetamine (ADDERALL XR) 25 MG 24 hr capsule Take 1 capsule by mouth every morning. 30 capsule 0  . amphetamine-dextroamphetamine (ADDERALL) 5 MG tablet Take as needed in the afternoon for ADD 30 tablet 0  . buPROPion (WELLBUTRIN SR) 150 MG 12 hr tablet TAKE 1 TABLET BY MOUTH TWICE A DAY 180 tablet 2  . cetirizine (ZYRTEC) 10 MG tablet Take 1 tablet (10 mg total) by mouth daily. 30 tablet 11  . fluticasone (FLONASE) 50 MCG/ACT nasal spray Place 2 sprays into both nostrils daily. 16 g 6  . ibuprofen (ADVIL,MOTRIN) 800 MG tablet Take 1 tablet (800 mg total) by mouth every 8 (eight) hours as needed. 30 tablet 5  . levonorgestrel (MIRENA) 20 MCG/24HR IUD 1 each by Intrauterine route once.    . pantoprazole (PROTONIX) 40 MG tablet Take 1 tablet (40 mg total) by mouth daily. 90 tablet 1  . valACYclovir (VALTREX) 1000 MG tablet Take 1 tablet (1,000 mg total) by mouth 3 (three) times daily.  Start at onset of symptoms 21 tablet 1  . guaiFENesin (MUCINEX) 600 MG 12 hr tablet Take 1 tablet (600 mg total) by mouth 2 (two) times daily.  30 tablet 3   No facility-administered medications prior to visit.     Allergies  Allergen Reactions  . Latex Swelling  . Codeine Nausea And Vomiting  . Codeine Nausea And Vomiting  . Latex     Feels like little cuts in her vagina.    ROS Review of Systems Review of Systems  Constitutional: Negative for activity change, appetite change, chills and fever.  HENT: Negative for congestion, nosebleeds, trouble swallowing and voice change.   Respiratory: Negative for cough, shortness of breath and wheezing.   Gastrointestinal: see hpi Genitourinary: Negative for difficulty urinating, dysuria, flank pain and hematuria.  Musculoskeletal: Negative for back pain, joint swelling and neck pain.  Neurological: Negative for dizziness, speech difficulty, light-headedness and numbness.  See HPI. All other review of systems negative.     Objective:    Physical Exam  BP 120/81   Pulse 95   Temp 98.2 F (36.8 C) (Oral)   Ht _0  (1.499 m)   Wt 192 lb 9.6 oz (87.4 kg)   SpO2 97%   BMI 38.90 kg/m  Wt Readings from Last 3 Encounters:  12/26/18 192 lb 9.6 oz (87.4 kg)  10/10/18 197 lb 6.4 oz (89.5 kg)  03/31/18 178 lb (80.7 kg)   Physical Exam  Constitutional: Oriented to person, place, and time. Appears well-developed and well-nourished.  HENT:  Head: Normocephalic and atraumatic.  Eyes: Conjunctivae and EOM are normal.  Cardiovascular: Normal rate, regular rhythm, normal heart sounds and intact distal pulses.  No murmur heard. Pulmonary/Chest: Effort normal and breath sounds normal. No stridor. No respiratory distress. Has no wheezes.  Abdomen: obese, non-distended, normoactive bs, soft, non-tender Neurological: Is alert and oriented to person, place, and time.  Skin: Skin is warm. Capillary refill takes less than 2 seconds.  Psychiatric:  Has a normal mood and affect. Behavior is normal. Judgment and thought content normal.    There are no preventive care reminders to display for this patient.  There are no preventive care reminders to display for this patient.  Lab Results  Component Value Date   TSH 1.719 02/20/2015   Lab Results  Component Value Date   WBC 7.0 03/16/2017   HGB 14.3 03/16/2017   HCT 42.7 03/16/2017   MCV 87 03/16/2017   PLT 263 03/16/2017   Lab Results  Component Value Date   NA 140 03/16/2017   K 4.3 03/16/2017   CO2 21 03/16/2017   GLUCOSE 95 03/16/2017   BUN 6 03/16/2017   CREATININE 0.71 03/16/2017   BILITOT 0.4 03/16/2017   ALKPHOS 73 03/16/2017   AST 16 03/16/2017   ALT 21 03/16/2017   PROT 7.4 03/16/2017   ALBUMIN 4.2 03/16/2017   CALCIUM 9.3 03/16/2017   Lab Results  Component Value Date   CHOL 144 10/05/2012   Lab Results  Component Value Date   HDL 43 10/05/2012   Lab Results  Component Value Date   LDLCALC 85 10/05/2012   Lab Results  Component Value Date   TRIG 78 10/05/2012   Lab Results  Component Value Date   CHOLHDL 3.3 10/05/2012   No results found for: HGBA1C    Assessment & Plan:   Problem List Items Addressed This Visit      Digestive   Gastroesophageal reflux disease     Other   Stomach pain - would advised food diary, advised pt take protonix twice daily Add probiotic Align   Relevant Orders   TSH   CMP14+EGFR  ADD (attention deficit disorder) PMP aware reviewed Pt stable on current dose with mood swings, anorexia or unintentional weight loss No abberrant use noted  Reviewed common adverse reactions      Other Visit Diagnoses    GI symptoms    -  Primary   Relevant Orders   TSH   CMP14+EGFR   Need for immunization against influenza       Relevant Orders   Flu Vaccine QUAD 36+ mos IM (Completed)   Anxiety and depression    - improved on wellbutrin   IUD (intrauterine device) in place    -  Follow up with Gynecology    Vaginal bleeding       Relevant Orders   CBC   Vitamin D deficiency    - will assess   Relevant Orders   VITAMIN D 25 Hydroxy (Vit-D Deficiency, Fractures)   Screening, lipid       Relevant Orders   Lipid panel      No orders of the defined types were placed in this encounter.   Follow-up: Return in about 3 months (around 03/28/2019) for ADD and stomach issues.   A total of 40 minutes were spent face-to-face with the patient during this encounter and over half of that time was spent on counseling and coordination of care.  Forrest Moron, MD

## 2018-12-27 LAB — CBC
Hematocrit: 42.2 % (ref 34.0–46.6)
Hemoglobin: 14 g/dL (ref 11.1–15.9)
MCH: 29 pg (ref 26.6–33.0)
MCHC: 33.2 g/dL (ref 31.5–35.7)
MCV: 87 fL (ref 79–97)
Platelets: 261 10*3/uL (ref 150–450)
RBC: 4.83 x10E6/uL (ref 3.77–5.28)
RDW: 13.1 % (ref 11.7–15.4)
WBC: 7.4 10*3/uL (ref 3.4–10.8)

## 2018-12-27 LAB — CMP14+EGFR
ALT: 23 IU/L (ref 0–32)
AST: 17 IU/L (ref 0–40)
Albumin/Globulin Ratio: 1.3 (ref 1.2–2.2)
Albumin: 4.1 g/dL (ref 3.8–4.8)
Alkaline Phosphatase: 83 IU/L (ref 39–117)
BUN/Creatinine Ratio: 13 (ref 9–23)
BUN: 12 mg/dL (ref 6–20)
Bilirubin Total: 0.6 mg/dL (ref 0.0–1.2)
CO2: 22 mmol/L (ref 20–29)
Calcium: 9.4 mg/dL (ref 8.7–10.2)
Chloride: 102 mmol/L (ref 96–106)
Creatinine, Ser: 0.92 mg/dL (ref 0.57–1.00)
GFR calc Af Amer: 92 mL/min/{1.73_m2} (ref >59)
GFR calc non Af Amer: 80 mL/min/{1.73_m2} (ref >59)
Globulin, Total: 3.2 g/dL (ref 1.5–4.5)
Glucose: 108 mg/dL — ABNORMAL HIGH (ref 65–99)
Potassium: 4.3 mmol/L (ref 3.5–5.2)
Sodium: 139 mmol/L (ref 134–144)
Total Protein: 7.3 g/dL (ref 6.0–8.5)

## 2018-12-27 LAB — LIPID PANEL
Chol/HDL Ratio: 4.2 ratio (ref 0.0–4.4)
Cholesterol, Total: 181 mg/dL (ref 100–199)
HDL: 43 mg/dL (ref >39)
LDL Chol Calc (NIH): 111 mg/dL — ABNORMAL HIGH (ref 0–99)
Triglycerides: 152 mg/dL — ABNORMAL HIGH (ref 0–149)
VLDL Cholesterol Cal: 27 mg/dL (ref 5–40)

## 2018-12-27 LAB — TSH: TSH: 2.84 u[IU]/mL (ref 0.450–4.500)

## 2018-12-27 LAB — VITAMIN D 25 HYDROXY (VIT D DEFICIENCY, FRACTURES): Vit D, 25-Hydroxy: 12.2 ng/mL — ABNORMAL LOW (ref 30.0–100.0)

## 2019-01-02 ENCOUNTER — Other Ambulatory Visit: Payer: Self-pay | Admitting: Family Medicine

## 2019-01-02 MED ORDER — VITAMIN D (ERGOCALCIFEROL) 1.25 MG (50000 UNIT) PO CAPS: 50000.0000 [IU] | ORAL_CAPSULE | ORAL | 3 refills | Status: DC

## 2019-01-17 ENCOUNTER — Other Ambulatory Visit: Payer: Self-pay | Admitting: Family Medicine

## 2019-01-17 NOTE — Telephone Encounter (Signed)
Patient is requesting a refill of the following medications: Requested Prescriptions   Pending Prescriptions Disp Refills  . albuterol (VENTOLIN HFA) 108 (90 Base) MCG/ACT inhaler [Pharmacy Med Name: ALBUTEROL HFA (PROAIR) INHALER]  0    Sig: INHALE 2 PUFFS BY MOUTH EVERY 6 HOURS AS NEEDED FOR WHEEZE OR SHORTNESS OF BREATH    Date of patient request: 01/17/19 Last office visit: 12/26/18 Date of last refill: 11/12/17 Last refill amount:8.5 inhaler no refill Follow up time period per chart: n/a

## 2019-01-17 NOTE — Telephone Encounter (Signed)
Requested medication (s) are due for refill today: no  Requested medication (s) are on the active medication list: no  Last refill:  11/12/2017  Future visit scheduled: yes  Notes to clinic:  Medication was discontinued    Requested Prescriptions  Pending Prescriptions Disp Refills   albuterol (VENTOLIN HFA) 108 (90 Base) MCG/ACT inhaler [Pharmacy Med Name: ALBUTEROL HFA (PROAIR) INHALER]  0    Sig: INHALE 2 PUFFS BY MOUTH EVERY 6 HOURS AS NEEDED FOR WHEEZE OR SHORTNESS OF BREATH     Pulmonology:  Beta Agonists Failed - 01/17/2019  9:18 AM      Failed - One inhaler should last at least one month. If the patient is requesting refills earlier, contact the patient to check for uncontrolled symptoms.      Passed - Valid encounter within last 12 months    Recent Outpatient Visits          3 weeks ago GI symptoms   Primary Care at [facility], Arlie Solomons, MD   1 month ago Seasonal allergies   Primary Care at [place], NP   2 months ago Flu-like symptoms   [facility] at [facility], [place], NP   3 months ago Chronic bilateral low back pain without sciatica   Primary Care at [facility], Arlie Solomons, MD   9 months ago Hip pain, left   Primary Care at [facility], Gelene Mink, PA-C      Future Appointments            In 2 months Forrest Moron, MD Primary Care at [place], [facility]

## 2019-01-26 ENCOUNTER — Other Ambulatory Visit: Payer: Self-pay | Admitting: Family Medicine

## 2019-01-26 MED ORDER — AMPHETAMINE-DEXTROAMPHETAMINE 5 MG PO TABS: ORAL_TABLET | ORAL | 0 refills | Status: DC

## 2019-01-26 MED ORDER — AMPHETAMINE-DEXTROAMPHET ER 25 MG PO CP24: 25.0000 mg | ORAL_CAPSULE | ORAL | 0 refills | Status: DC

## 2019-02-07 ENCOUNTER — Other Ambulatory Visit: Payer: Self-pay

## 2019-02-07 ENCOUNTER — Ambulatory Visit: Payer: BC Managed Care – PPO | Admitting: Obstetrics

## 2019-02-07 VITALS — BP 106/71 | HR 92 | Wt 190.0 lb

## 2019-02-07 DIAGNOSIS — Z30432 Encounter for removal of intrauterine contraceptive device: Secondary | ICD-10-CM

## 2019-02-07 MED ORDER — PRENATAL PLUS 27-1 MG PO TABS: 1.0000 | ORAL_TABLET | Freq: Every day | ORAL | 11 refills | Status: DC

## 2019-02-13 ENCOUNTER — Encounter: Payer: Self-pay | Admitting: Obstetrics

## 2019-02-13 NOTE — Progress Notes (Signed)
    GYNECOLOGY OFFICE PROCEDURE NOTE  Aimee King is a 38 y.o. No obstetric history on file. here for [place] IUD removal. No GYN concerns.    IUD Removal  Patient identified, informed consent performed, consent signed.  Patient was in the dorsal lithotomy position, normal external genitalia was noted.  A speculum was placed in the patient's vagina, normal discharge was noted, no lesions. The cervix was visualized, no lesions, no abnormal discharge.  The strings of the IUD were grasped and pulled using ring forceps. The IUD was removed in its entirety.  Patient tolerated the procedure well.    Patient will use nothing for contraception / has plans for pregnancy soon and she was told to avoid teratogens, take PNV and folic acid.  Routine preventative health maintenance measures emphasized.   Shelly Bombard, MD, [place] for [facility], [facility] 02/13/2019

## 2019-03-07 ENCOUNTER — Other Ambulatory Visit: Payer: Self-pay | Admitting: Family Medicine

## 2019-03-07 MED ORDER — AMPHETAMINE-DEXTROAMPHETAMINE 5 MG PO TABS: ORAL_TABLET | ORAL | 0 refills | Status: DC

## 2019-03-07 MED ORDER — AMPHETAMINE-DEXTROAMPHET ER 25 MG PO CP24: 25.0000 mg | ORAL_CAPSULE | ORAL | 0 refills | Status: DC

## 2019-03-26 ENCOUNTER — Other Ambulatory Visit: Payer: Self-pay | Admitting: Family Medicine

## 2019-03-26 DIAGNOSIS — K219 Gastro-esophageal reflux disease without esophagitis: Secondary | ICD-10-CM

## 2019-03-29 ENCOUNTER — Ambulatory Visit: Payer: BC Managed Care – PPO | Admitting: Family Medicine

## 2019-03-30 ENCOUNTER — Encounter: Payer: Self-pay | Admitting: Adult Health Nurse Practitioner

## 2019-03-30 ENCOUNTER — Other Ambulatory Visit: Payer: Self-pay

## 2019-03-30 ENCOUNTER — Ambulatory Visit: Payer: BC Managed Care – PPO | Admitting: Adult Health Nurse Practitioner

## 2019-03-30 ENCOUNTER — Ambulatory Visit (INDEPENDENT_AMBULATORY_CARE_PROVIDER_SITE_OTHER): Payer: BC Managed Care – PPO

## 2019-03-30 VITALS — BP 109/77 | HR 94 | Temp 97.8°F | Ht 59.0 in | Wt 197.8 lb

## 2019-03-30 DIAGNOSIS — J209 Acute bronchitis, unspecified: Secondary | ICD-10-CM

## 2019-03-30 DIAGNOSIS — Z8616 Personal history of COVID-19: Secondary | ICD-10-CM

## 2019-03-30 MED ORDER — PREDNISONE 20 MG PO TABS: ORAL_TABLET | ORAL | 0 refills | Status: DC

## 2019-03-30 MED ORDER — AZITHROMYCIN 250 MG PO TABS: ORAL_TABLET | ORAL | 0 refills | Status: DC

## 2019-03-30 NOTE — Patient Instructions (Signed)
° ° ° °  If you have lab work done today you will be contacted with your lab results within the next 2 weeks.  If you have not heard from us then please contact us. The fastest way to get your results is to register for My Chart. ° ° °IF you received an x-ray today, you will receive an invoice from [facility]. Please contact [facility] at [phone] with questions or concerns regarding your invoice.  ° °IF you received labwork today, you will receive an invoice from [facility]. Please contact [facility] at [phone] with questions or concerns regarding your invoice.  ° °Our billing staff will not be able to assist you with questions regarding bills from these companies. ° °You will be contacted with the lab results as soon as they are available. The fastest way to get your results is to activate your My Chart account. Instructions are located on the last page of this paperwork. If you have not heard from us regarding the results in 2 weeks, please contact this office. °  ° ° ° °

## 2019-03-30 NOTE — Progress Notes (Signed)
     03/30/2019  Aimee King 1980-07-25 CI:[id number]  SUBJECTIVE:  Aimee King is a 39 y.o. female who was diagnosed with Covid in early January along with the rest of her family.  She ended isolation on January 21.  Since then she has had progressive cough which is nonproductive during the day and productive at night.  Coughing until 3 AM.  Also experiencing body aches but no fever.  She feels very worn down and fatigued.  Some shortness of breath with increased coughing at night.  The rest of her family has recovered but she remains positive for upper respiratory symptoms.  She has a history of bronchospasm related to a severe influenza diagnosis in the past.  She has inhalers at home which she has used minimally.  She lost taste and smell at the onset of the illness and that has recovered though not fully. 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  She request more time to work remotely given her symptoms.  ROS: Review of Systems - Negative except the below positives General ROS: positive for  - fatigue, malaise, night sweats and sleep disturbance ENT ROS: positive for - nasal congestion Respiratory ROS: positive for - cough, shortness of breath and wheezing    OBJECTIVE: She appears well, vital signs are as noted. Ears normal.  Throat and pharynx normal.  Neck supple. No adenopathy in the neck. Nose is congested. Sinuses non tender. The chest is non-tender.  Rhonchi noted to middle left lobe and expiratory wheezes noted on auscultation.  Heart sounds: S1, S2, with a regular rate and rhythm.  Skin is without lesions or rash.  Pulses normal, symmetric.  ASSESSMENT:  1. Personal history of covid-19   2. Bronchitis with bronchospasm    CXR: R<L lung volumes  No evidence of PNA Personally reviewed   PLAN: Symptomatic therapy suggested: push fluids, rest, gargle warm salt water, use vaporizer or mist prn and return office visit  prn if symptoms persist or worsen.Call or return to clinic prn if these symptoms worsen or fail to improve as anticipated.

## 2019-03-31 ENCOUNTER — Ambulatory Visit: Payer: BC Managed Care – PPO | Admitting: Family Medicine

## 2019-04-09 ENCOUNTER — Other Ambulatory Visit: Payer: Self-pay | Admitting: Family Medicine

## 2019-04-09 MED ORDER — AMPHETAMINE-DEXTROAMPHETAMINE 5 MG PO TABS: ORAL_TABLET | ORAL | 0 refills | Status: DC

## 2019-04-09 MED ORDER — AMPHETAMINE-DEXTROAMPHET ER 25 MG PO CP24: 25.0000 mg | ORAL_CAPSULE | ORAL | 0 refills | Status: DC

## 2019-04-17 ENCOUNTER — Encounter: Payer: Self-pay | Admitting: Family Medicine

## 2019-05-12 ENCOUNTER — Other Ambulatory Visit: Payer: Self-pay | Admitting: Family Medicine

## 2019-05-15 ENCOUNTER — Other Ambulatory Visit: Payer: Self-pay | Admitting: Registered Nurse

## 2019-05-15 DIAGNOSIS — J302 Other seasonal allergic rhinitis: Secondary | ICD-10-CM

## 2019-05-15 NOTE — Telephone Encounter (Addendum)
Fluticasone 50 mcg refilled per medication refill protocol.

## 2019-05-26 ENCOUNTER — Other Ambulatory Visit: Payer: Self-pay | Admitting: Registered Nurse

## 2019-05-26 DIAGNOSIS — K219 Gastro-esophageal reflux disease without esophagitis: Secondary | ICD-10-CM

## 2019-06-08 ENCOUNTER — Other Ambulatory Visit: Payer: Self-pay | Admitting: Family Medicine

## 2019-06-10 MED ORDER — AMPHETAMINE-DEXTROAMPHETAMINE 5 MG PO TABS: ORAL_TABLET | ORAL | 0 refills | Status: DC

## 2019-06-10 MED ORDER — AMPHETAMINE-DEXTROAMPHET ER 25 MG PO CP24: 25.0000 mg | ORAL_CAPSULE | ORAL | 0 refills | Status: DC

## 2019-06-21 ENCOUNTER — Ambulatory Visit: Payer: BC Managed Care – PPO | Admitting: Family Medicine

## 2019-06-21 ENCOUNTER — Other Ambulatory Visit: Payer: Self-pay

## 2019-06-21 ENCOUNTER — Encounter: Payer: Self-pay | Admitting: Family Medicine

## 2019-06-21 VITALS — BP 111/74 | HR 94 | Temp 97.9°F | Resp 16 | Ht 59.75 in | Wt 192.0 lb

## 2019-06-21 DIAGNOSIS — K6289 Other specified diseases of anus and rectum: Secondary | ICD-10-CM

## 2019-06-21 DIAGNOSIS — Z13228 Encounter for screening for other metabolic disorders: Secondary | ICD-10-CM | POA: Diagnosis not present

## 2019-06-21 DIAGNOSIS — N926 Irregular menstruation, unspecified: Secondary | ICD-10-CM

## 2019-06-21 DIAGNOSIS — M79672 Pain in left foot: Secondary | ICD-10-CM

## 2019-06-21 DIAGNOSIS — R5383 Other fatigue: Secondary | ICD-10-CM | POA: Diagnosis not present

## 2019-06-21 DIAGNOSIS — Z13220 Encounter for screening for lipoid disorders: Secondary | ICD-10-CM

## 2019-06-21 DIAGNOSIS — E559 Vitamin D deficiency, unspecified: Secondary | ICD-10-CM

## 2019-06-21 LAB — POCT CBC
Granulocyte percent: 41 %G (ref 37–80)
HCT, POC: 41.5 % — AB (ref 29–41)
Hemoglobin: 13.4 g/dL (ref 11–14.6)
Lymph, poc: 3 (ref 0.6–3.4)
MCH, POC: 28.4 pg (ref 27–31.2)
MCHC: 32.3 g/dL (ref 31.8–35.4)
MCV: 88 fL (ref 76–111)
MID (cbc): 1 — AB (ref 0–0.9)
MPV: 8.3 fL (ref 0–99.8)
POC Granulocyte: 2.8 (ref 2–6.9)
POC LYMPH PERCENT: 44.4 %L (ref 10–50)
POC MID %: 14.6 %M — AB (ref 0–12)
Platelet Count, POC: 256 10*3/uL (ref 142–424)
RBC: 4.72 M/uL (ref 4.04–5.48)
RDW, POC: 13.5 %
WBC: 6.9 10*3/uL (ref 4.6–10.2)

## 2019-06-21 LAB — POCT URINALYSIS DIP (MANUAL ENTRY)
Bilirubin, UA: NEGATIVE
Blood, UA: NEGATIVE
Glucose, UA: NEGATIVE mg/dL
Ketones, POC UA: NEGATIVE mg/dL
Leukocytes, UA: NEGATIVE
Nitrite, UA: NEGATIVE
Protein Ur, POC: NEGATIVE mg/dL
Spec Grav, UA: >=1.03 — AB (ref 1.010–1.025)
Urobilinogen, UA: 0.2 E.U./dL
pH, UA: 5.5 (ref 5.0–8.0)

## 2019-06-21 LAB — POCT GLYCOSYLATED HEMOGLOBIN (HGB A1C): Hemoglobin A1C: 6 % — AB (ref 4.0–5.6)

## 2019-06-21 LAB — POCT URINE PREGNANCY: Preg Test, Ur: NEGATIVE

## 2019-06-21 NOTE — Progress Notes (Signed)
Established Patient Office Visit  Subjective:  Patient ID: Aimee King, female    DOB: 23-Jan-1981  Age: 39 y.o. MRN: [id number]  CC:  Chief Complaint  Patient presents with  . Fatigue    excessive x 3 weeks  . Nausea    excessive x 3 weeks  . Amenorrhea    per pt LMP 05/11/2019    HPI Aimee King presents for   She reports that she has severe fatigue now that is worse than when she had covid   She has left heel pain despite using shoe inserts She feels like it is her entire heel  Rectal Pain  Patient reports rectal pain that has happened 4-5 times that resolves after an hour after a BM No blood per rectum  No pain during defectation No itching  Pain is 8/10 Onset has been 3-4 months It is intermittent  She reports that she cannot straighten up to walk normally after having a BM  She feels like there is a tube in her rectum that feels like a tube that is bent No anal intercourse  Past Medical History:  Diagnosis Date  . ADD (attention deficit disorder)   . Allergy   . Anemia   . Attention deficit disorder (ADD)   . Depression   . Endometriosis     Past Surgical History:  Procedure Laterality Date  . CESAREAN SECTION  2004, 2008  . CESAREAN SECTION    . CHOLECYSTECTOMY  2004  . CHOLECYSTECTOMY    . LAPAROSCOPY  2002   endometriosis  . TUBAL LIGATION  2008  . TUBAL LIGATION      Family History  Problem Relation Age of Onset  . Hypertension Mother   . Diabetes Mother   . Hypertension Father   . Heart disease Father   . Diabetes Father   . Diabetes Maternal Grandmother     Social History   Socioeconomic History  . Marital status: Married    Spouse name: Not on file  . Number of children: Not on file  . Years of education: Not on file  . Highest education level: Not on file  Occupational History  . Not on file  Tobacco Use  . Smoking status: Never Smoker  . Smokeless tobacco: Never Used  Substance and Sexual Activity  .  Alcohol use: Yes    Alcohol/week: 0.0 standard drinks    Comment: ocassionally  . Drug use: No  . Sexual activity: Yes    Partners: Male    Birth control/protection: I.U.D.  Other Topics Concern  . Not on file  Social History Narrative   ** Merged History Encounter **       Social Determinants of Health   Financial Resource Strain: Low Risk   . Difficulty of Paying Living Expenses: Not hard at all  Food Insecurity: No Food Insecurity  . Worried About Charity fundraiser in the Last Year: Never true  . Ran Out of Food in the Last Year: Never true  Transportation Needs: No Transportation Needs  . Lack of Transportation (Medical): No  . Lack of Transportation (Non-Medical): No  Physical Activity: Insufficiently Active  . Days of Exercise per Week: 3 days  . Minutes of Exercise per Session: 30 min  Stress: No Stress Concern Present  . Feeling of Stress : Only a little  Social Connections:   . Frequency of Communication with Friends and Family:   . Frequency of Social Gatherings with Friends and Family:   .  Attends Religious Services:   . Active Member of Clubs or Organizations:   . Attends Archivist Meetings:   Marland Kitchen Marital Status:   Intimate Partner Violence:   . Fear of Current or Ex-Partner:   . Emotionally Abused:   Marland Kitchen Physically Abused:   . Sexually Abused:     Outpatient Medications Prior to Visit  Medication Sig Dispense Refill  . amphetamine-dextroamphetamine (ADDERALL XR) 25 MG 24 hr capsule Take 1 capsule by mouth every morning. 30 capsule 0  . amphetamine-dextroamphetamine (ADDERALL) 5 MG tablet Take as needed in the afternoon for ADD 30 tablet 0  . buPROPion (WELLBUTRIN SR) 150 MG 12 hr tablet Take 1 tablet (150 mg total) by mouth 2 (two) times daily. 180 tablet 3  . cetirizine (ZYRTEC) 10 MG tablet Take 1 tablet (10 mg total) by mouth daily. 30 tablet 11  . fluticasone (FLONASE) 50 MCG/ACT nasal spray SPRAY 2 SPRAYS INTO EACH NOSTRIL EVERY DAY 48 mL 2    . pantoprazole (PROTONIX) 40 MG tablet Take 1 tablet (40 mg total) by mouth 2 (two) times daily. 180 tablet 0  . valACYclovir (VALTREX) 1000 MG tablet Take 1 tablet (1,000 mg total) by mouth 3 (three) times daily. Start at onset of symptoms 21 tablet 1  . Vitamin D, Ergocalciferol, (DRISDOL) 1.25 MG (50000 UT) CAPS capsule Take 1 capsule (50,000 Units total) by mouth every 7 (seven) days. 12 capsule 3  . albuterol (VENTOLIN HFA) 108 (90 Base) MCG/ACT inhaler INHALE 2 PUFFS BY MOUTH EVERY 6 HOURS AS NEEDED FOR WHEEZE OR SHORTNESS OF BREATH 8.5 g 3  . predniSONE (DELTASONE) 20 MG tablet 3 tabs x 2 days, 2 tabs x 4 days, 1 tab x 4 days (Patient not taking: Reported on 06/21/2019) 18 tablet 0  . prenatal vitamin w/FE, FA (PRENATAL 1 + 1) 27-1 MG TABS tablet Take 1 tablet by mouth daily before breakfast. (Patient not taking: Reported on 06/21/2019) 30 tablet 11  . azithromycin (ZITHROMAX) 250 MG tablet As directed on pkg label 6 tablet 0  . ibuprofen (ADVIL,MOTRIN) 800 MG tablet Take 1 tablet (800 mg total) by mouth every 8 (eight) hours as needed. 30 tablet 5   No facility-administered medications prior to visit.    Allergies  Allergen Reactions  . Latex Swelling  . Codeine Nausea And Vomiting  . Codeine Nausea And Vomiting  . Latex     Feels like little cuts in her vagina.    ROS Review of Systems Review of Systems  Constitutional: Negative for activity change, appetite change, chills and fever.  HENT: Negative for congestion, nosebleeds, trouble swallowing and voice change.   Respiratory: Negative for cough, shortness of breath and wheezing.   Gastrointestinal: Negative for diarrhea, nausea and vomiting. See hpi Genitourinary: Negative for difficulty urinating, dysuria, flank pain and hematuria.  Musculoskeletal: Negative for back pain, joint swelling and neck pain.  Neurological: Negative for dizziness, speech difficulty, light-headedness and numbness.  See HPI. All other review of  systems negative.     Objective:    Physical Exam  BP 111/74   Pulse 94   Temp 97.9 F (36.6 C) (Temporal)   Resp 16   Ht 4' 11.75" (1.518 m)   Wt 192 lb (87.1 kg)   LMP 05/11/2019   SpO2 98%   BMI 37.81 kg/m  Wt Readings from Last 3 Encounters:  06/21/19 192 lb (87.1 kg)  03/30/19 197 lb 12.8 oz (89.7 kg)  02/07/19 190 lb (86.2 kg)  Physical Exam  Constitutional: Oriented to person, place, and time. Appears well-developed and well-nourished.  HENT:  Head: Normocephalic and atraumatic.  Eyes: Conjunctivae and EOM are normal.  Cardiovascular: Normal rate, regular rhythm, normal heart sounds and intact distal pulses.  No murmur heard. Pulmonary/Chest: Effort normal and breath sounds normal. No stridor. No respiratory distress. Has no wheezes.  Neurological: Is alert and oriented to person, place, and time.  Skin: Skin is warm. Capillary refill takes less than 2 seconds.  Psychiatric: Has a normal mood and affect. Behavior is normal. Judgment and thought content normal.    There are no preventive care reminders to display for this patient.  There are no preventive care reminders to display for this patient.  Lab Results  Component Value Date   TSH 2.840 12/26/2018   Lab Results  Component Value Date   WBC 6.9 06/21/2019   HGB 13.4 06/21/2019   HCT 41.5 (A) 06/21/2019   MCV 88.0 06/21/2019   PLT 261 12/26/2018   Lab Results  Component Value Date   NA 139 12/26/2018   K 4.3 12/26/2018   CO2 22 12/26/2018   GLUCOSE 108 (H) 12/26/2018   BUN 12 12/26/2018   CREATININE 0.92 12/26/2018   BILITOT 0.6 12/26/2018   ALKPHOS 83 12/26/2018   AST 17 12/26/2018   ALT 23 12/26/2018   PROT 7.3 12/26/2018   ALBUMIN 4.1 12/26/2018   CALCIUM 9.4 12/26/2018   Lab Results  Component Value Date   CHOL 181 12/26/2018   Lab Results  Component Value Date   HDL 43 12/26/2018   Lab Results  Component Value Date   LDLCALC 111 (H) 12/26/2018   Lab Results  Component  Value Date   TRIG 152 (H) 12/26/2018   Lab Results  Component Value Date   CHOLHDL 4.2 12/26/2018   Lab Results  Component Value Date   HGBA1C 6.0 (A) 06/21/2019      Assessment & Plan:   Problem List Items Addressed This Visit    None    Visit Diagnoses    Other fatigue    -  Primary Discussed that long term use of stimulants can lead to fatigue if the stimulant needs to be changed Will refer to Psychiatry   Relevant Orders   POCT glycosylated hemoglobin (Hb A1C) (Completed)   POCT CBC (Completed)   Ambulatory referral to Psychiatry   Screening, lipid       Screening for metabolic disorder       Missed period    -  Negative for pregnancy   Relevant Orders   POCT urinalysis dipstick (Completed)   POCT urine pregnancy (Completed)   Vitamin D deficiency    -  Discussed reassessing deficiency   Relevant Orders   VITAMIN D 25 Hydroxy (Vit-D Deficiency, Fractures)   Pain of left heel    -  Referral to podiatry  Concerning for plantar fasciitis though heel spur   Relevant Orders   Ambulatory referral to Podiatry   Rectal pain    -  Uncertain of the etiology but given her description she should get a more invasive test that office based anoscopy.   Relevant Orders   Ambulatory referral to Gastroenterology      No orders of the defined types were placed in this encounter.  A total of 45 minutes were spent face-to-face with the patient during this encounter and over half of that time was spent on counseling and coordination of care.   Follow-up: No follow-ups  on file.    Forrest Moron, MD

## 2019-06-21 NOTE — Patient Instructions (Signed)
° ° ° °  If you have lab work done today you will be contacted with your lab results within the next 2 weeks.  If you have not heard from us then please contact us. The fastest way to get your results is to register for My Chart. ° ° °IF you received an x-ray today, you will receive an invoice from [facility]. Please contact  Radiology at [phone] with questions or concerns regarding your invoice.  ° °IF you received labwork today, you will receive an invoice from [facility]. Please contact [facility] at [phone] with questions or concerns regarding your invoice.  ° °Our billing staff will not be able to assist you with questions regarding bills from these companies. ° °You will be contacted with the lab results as soon as they are available. The fastest way to get your results is to activate your My Chart account. Instructions are located on the last page of this paperwork. If you have not heard from us regarding the results in 2 weeks, please contact this office. °  ° ° ° °

## 2019-06-22 LAB — VITAMIN D 25 HYDROXY (VIT D DEFICIENCY, FRACTURES): Vit D, 25-Hydroxy: 25.1 ng/mL — ABNORMAL LOW (ref 30.0–100.0)

## 2019-06-23 ENCOUNTER — Encounter: Payer: Self-pay | Admitting: Family Medicine

## 2019-06-23 ENCOUNTER — Telehealth: Payer: Self-pay

## 2019-06-23 ENCOUNTER — Encounter: Payer: Self-pay | Admitting: Gastroenterology

## 2019-06-23 NOTE — Telephone Encounter (Signed)
Pt is asking how much weight she needs to lose to lower her a1c, sent pt info from [email or web address] regarding a1c control. Sent doctor message regarding pt concern with vitamin D. Asked pt to follow up with me on Monday

## 2019-07-10 ENCOUNTER — Other Ambulatory Visit: Payer: Self-pay | Admitting: Family Medicine

## 2019-07-10 DIAGNOSIS — K219 Gastro-esophageal reflux disease without esophagitis: Secondary | ICD-10-CM

## 2019-07-13 ENCOUNTER — Other Ambulatory Visit: Payer: Self-pay

## 2019-07-13 ENCOUNTER — Encounter: Payer: Self-pay | Admitting: Podiatry

## 2019-07-13 ENCOUNTER — Ambulatory Visit (INDEPENDENT_AMBULATORY_CARE_PROVIDER_SITE_OTHER): Payer: BC Managed Care – PPO

## 2019-07-13 ENCOUNTER — Ambulatory Visit: Payer: BC Managed Care – PPO | Admitting: Podiatry

## 2019-07-13 DIAGNOSIS — M722 Plantar fascial fibromatosis: Secondary | ICD-10-CM

## 2019-07-13 DIAGNOSIS — G8929 Other chronic pain: Secondary | ICD-10-CM | POA: Diagnosis not present

## 2019-07-13 DIAGNOSIS — M79673 Pain in unspecified foot: Secondary | ICD-10-CM | POA: Diagnosis not present

## 2019-07-13 MED ORDER — DICLOFENAC SODIUM 75 MG PO TBEC: 75.0000 mg | DELAYED_RELEASE_TABLET | Freq: Two times a day (BID) | ORAL | 0 refills | Status: DC

## 2019-07-13 NOTE — Patient Instructions (Signed)

## 2019-07-15 NOTE — Progress Notes (Signed)
Subjective:   Patient ID: Aimee King, female   DOB: 39 y.o.   MRN: [id number]   HPI 39 year old female presents the office today for concerns of pain to both of her heels which has been ongoing for greater than 6 months.  Been getting worse over the last 3 months.  She gets the majority pain to the bottom of the heel which is worse in the morning after being on her feet all day.  She has changed shoes which is helped some.  No recent injury or falls.  No significant swelling.    I previously saw her daughter and when she was in the office I discussed with her plan fasciitis and she changed shoes which was helpful.  Even with changes she still has discomfort.   Review of Systems  All other systems reviewed and are negative.  Past Medical History:  Diagnosis Date  . ADD (attention deficit disorder)   . Allergy   . Anemia   . Attention deficit disorder (ADD)   . Depression   . Endometriosis     Past Surgical History:  Procedure Laterality Date  . CESAREAN SECTION  2004, 2008  . CESAREAN SECTION    . CHOLECYSTECTOMY  2004  . CHOLECYSTECTOMY    . LAPAROSCOPY  2002   endometriosis  . TUBAL LIGATION  2008  . TUBAL LIGATION       Current Outpatient Medications:  .  albuterol (VENTOLIN HFA) 108 (90 Base) MCG/ACT inhaler, INHALE 2 PUFFS BY MOUTH EVERY 6 HOURS AS NEEDED FOR WHEEZE OR SHORTNESS OF BREATH, Disp: 8.5 g, Rfl: 3 .  amphetamine-dextroamphetamine (ADDERALL XR) 25 MG 24 hr capsule, Take 1 capsule by mouth every morning., Disp: 30 capsule, Rfl: 0 .  amphetamine-dextroamphetamine (ADDERALL) 5 MG tablet, Take as needed in the afternoon for ADD, Disp: 30 tablet, Rfl: 0 .  buPROPion (WELLBUTRIN SR) 150 MG 12 hr tablet, Take 1 tablet (150 mg total) by mouth 2 (two) times daily., Disp: 180 tablet, Rfl: 3 .  cetirizine (ZYRTEC) 10 MG tablet, Take 1 tablet (10 mg total) by mouth daily., Disp: 30 tablet, Rfl: 11 .  diclofenac (VOLTAREN) 75 MG EC tablet, Take 1 tablet (75 mg  total) by mouth 2 (two) times daily., Disp: 30 tablet, Rfl: 0 .  fluticasone (FLONASE) 50 MCG/ACT nasal spray, SPRAY 2 SPRAYS INTO EACH NOSTRIL EVERY DAY, Disp: 48 mL, Rfl: 2 .  pantoprazole (PROTONIX) 40 MG tablet, TAKE 1 TABLET BY MOUTH 2 TIMES DAILY BEFORE A MEAL, Disp: 180 tablet, Rfl: 0 .  predniSONE (DELTASONE) 20 MG tablet, 3 tabs x 2 days, 2 tabs x 4 days, 1 tab x 4 days (Patient not taking: Reported on 06/21/2019), Disp: 18 tablet, Rfl: 0 .  prenatal vitamin w/FE, FA (PRENATAL 1 + 1) 27-1 MG TABS tablet, Take 1 tablet by mouth daily before breakfast. (Patient not taking: Reported on 06/21/2019), Disp: 30 tablet, Rfl: 11 .  valACYclovir (VALTREX) 1000 MG tablet, Take 1 tablet (1,000 mg total) by mouth 3 (three) times daily. Start at onset of symptoms, Disp: 21 tablet, Rfl: 1 .  Vitamin D, Ergocalciferol, (DRISDOL) 1.25 MG (50000 UT) CAPS capsule, Take 1 capsule (50,000 Units total) by mouth every 7 (seven) days., Disp: 12 capsule, Rfl: 3  Allergies  Allergen Reactions  . Latex Swelling  . Codeine Nausea And Vomiting  . Codeine Nausea And Vomiting  . Latex     Feels like little cuts in her vagina.  Objective:  Physical Exam  General: AAO x3, NAD  Dermatological: Skin is warm, dry and supple bilateral. Nails x 10 are well manicured; remaining integument appears unremarkable at this time. There are no open sores, no preulcerative lesions, no rash or signs of infection present.  Vascular: Dorsalis Pedis artery and Posterior Tibial artery pedal pulses are 2/4 bilateral with immedate capillary fill time. Pedal hair growth present. No varicosities and no lower extremity edema present bilateral. There is no pain with calf compression, swelling, warmth, erythema.   Neruologic: Grossly intact via light touch bilateral.  Negative Tinel sign. Musculoskeletal: Tenderness to palpation along the plantar medial tubercle of the calcaneus at the insertion of plantar fascia on the left >> right  foot. There is no pain along the course of the plantar fascia within the arch of the foot. Plantar fascia appears to be intact. There is no pain with lateral compression of the calcaneus or pain with vibratory sensation. There is no pain along the course or insertion of the achilles tendon. No other areas of tenderness to bilateral lower extremities.Muscular strength 5/5 in all groups tested bilateral.  Gait: Unassisted, Nonantalgic.       Assessment:   Bilateral plantar fasciitis left side worse than right     Plan:  -Treatment options discussed including all alternatives, risks, and complications -Etiology of symptoms were discussed -X-rays were obtained and reviewed with the patient.  There is no evidence of acute fracture or stress fracture identified today. -Bilateral plantar fascial injections performed.  See procedure note below. -Plantar fascial braces dispensed x2 -Discussed stretching, icing -Continue supportive shoes and discussed orthotics -Prescribed Voltaren. Discussed side effects of the medication and directed to stop if any are to occur and call the office.   Procedure: Injection Tendon/Ligament Discussed alternatives, risks, complications and verbal consent was obtained.  Location: Bilateral plantar fascia at the glabrous junction; medial approach. Skin Prep: Alcohol  Injectate: 0.5cc 0.5% marcaine plain, 0.5 cc 2% lidocaine plain and, 1 cc kenalog 10. Disposition: Patient tolerated procedure well. Injection site dressed with a band-aid.  Post-injection care was discussed and return precautions discussed.   Trula Slade DPM

## 2019-07-25 ENCOUNTER — Encounter: Payer: Self-pay | Admitting: Gastroenterology

## 2019-07-25 ENCOUNTER — Ambulatory Visit (INDEPENDENT_AMBULATORY_CARE_PROVIDER_SITE_OTHER): Payer: BC Managed Care – PPO

## 2019-07-25 ENCOUNTER — Ambulatory Visit: Payer: BC Managed Care – PPO | Admitting: Gastroenterology

## 2019-07-25 VITALS — BP 118/82 | HR 105 | Ht 59.0 in | Wt 187.8 lb

## 2019-07-25 DIAGNOSIS — Z01818 Encounter for other preprocedural examination: Secondary | ICD-10-CM | POA: Diagnosis not present

## 2019-07-25 DIAGNOSIS — Z1159 Encounter for screening for other viral diseases: Secondary | ICD-10-CM

## 2019-07-25 DIAGNOSIS — K6289 Other specified diseases of anus and rectum: Secondary | ICD-10-CM

## 2019-07-25 MED ORDER — NA SULFATE-K SULFATE-MG SULF 17.5-3.13-1.6 GM/177ML PO SOLN: 1.0000 | Freq: Once | ORAL | 0 refills | Status: AC

## 2019-07-25 NOTE — Progress Notes (Signed)
Referring Provider: Forrest Moron, MD Primary Care Physician:  Forrest Moron, MD  Reason for Consultation:  Rectal pain   IMPRESSION:  Rectal pain - 4-5 times, occurring after defecation No family history of colon cancer or polyps  PLAN: Add a daily stool bulking agent such as psyllium or methylcellulose Colonoscopy to evaluate for organic disease Consider cross-sectional imaging if colonoscopy is negative  Please see the "Patient Instructions" section for addition details about the plan.  HPI: Aimee King is a 39 y.o. female referred by Dr. Nolon Rod for further evaluation of rectal pain. The history is obtained through the patient and review of her electronic health record.   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 She has not had the Covid vaccine after having Covid in January.   Reports a long and extensive history of "stomach problems." Has a "narcististic" stomach.   Intermittent rectal pain. Unpredictable frequency. Feels like a bent paper towel roll is in her abdomen after some bowel movements. Not a true rectal pain. Has occurred 4-5 times. Can't reproduce the pain. Develops when she first stands up after defecatopm. Feels like a catch.  Eases up 30-60 minutes later without intervention.  Often feels exhausted after defecation.  No identifed triggers. No constipation or diarrhea. No straining with defecation. No tenesmus.  Sense of complete evacuation.   Tried Miralax and stool softeners without significant change.  No symptoms prior to defecation. No identified triggers.  No blood or mucous in the stools.  No rectal foreign bodies or rectal injury.   Moderate amount of stool mentioned on prior abdominal and back x-rays.   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 No known family history of colon cancer or polyps. No family history of uterine/endometrial cancer, pancreatic cancer or gastric/stomach cancer.   Past Medical  History:  Diagnosis Date  . Acid reflux   . ADD (attention deficit disorder)   . Allergy   . Anemia   . Attention deficit disorder (ADD)   . Depression   . Endometriosis   . IBS (irritable bowel syndrome)     Past Surgical History:  Procedure Laterality Date  . CESAREAN SECTION  2004, 2008  . CESAREAN SECTION    . CHOLECYSTECTOMY  2004  . CHOLECYSTECTOMY    . LAPAROSCOPY  2002   endometriosis  . TUBAL LIGATION  2008  . TUBAL LIGATION      Current Outpatient Medications  Medication Sig Dispense Refill  . amphetamine-dextroamphetamine (ADDERALL XR) 25 MG 24 hr capsule Take 1 capsule by mouth every morning. 30 capsule 0  . amphetamine-dextroamphetamine (ADDERALL) 5 MG tablet Take as needed in the afternoon for ADD 30 tablet 0  . buPROPion (WELLBUTRIN SR) 150 MG 12 hr tablet Take 1 tablet (150 mg total) by mouth 2 (two) times daily. 180 tablet 3  . diclofenac (VOLTAREN) 75 MG EC tablet Take 1 tablet (75 mg total) by mouth 2 (two) times daily. 30 tablet 0  . fluticasone (FLONASE) 50 MCG/ACT nasal spray SPRAY 2 SPRAYS INTO EACH NOSTRIL EVERY DAY 48 mL 2  . pantoprazole (PROTONIX) 40 MG tablet TAKE 1 TABLET BY MOUTH 2 TIMES DAILY BEFORE A MEAL 180 tablet 0  . prenatal vitamin w/FE, FA (PRENATAL 1 + 1) 27-1 MG TABS tablet Take 1 tablet by mouth daily before breakfast. 30 tablet 11  . valACYclovir (VALTREX) 1000 MG tablet Take 1 tablet (1,000 mg total) by mouth 3 (three) times daily. Start at onset of symptoms  21 tablet 1  . Vitamin D, Ergocalciferol, (DRISDOL) 1.25 MG (50000 UT) CAPS capsule Take 1 capsule (50,000 Units total) by mouth every 7 (seven) days. 12 capsule 3   No current facility-administered medications for this visit.    Allergies as of 07/25/2019 - Review Complete 07/25/2019  Allergen Reaction Noted  . Latex Swelling 06/05/2012  . Codeine Nausea And Vomiting 06/05/2012  . Codeine Nausea And Vomiting 12/16/2012  . Latex  12/16/2012    Family History  Problem  Relation Age of Onset  . Hypertension Mother   . Diabetes Mother   . Hypertension Father   . Heart disease Father   . Diabetes Father   . Diabetes Maternal Grandmother   . Kidney disease Maternal Grandfather   . Kidney disease Maternal Uncle     Social History   Socioeconomic History  . Marital status: Married    Spouse name: Not on file  . Number of children: 3  . Years of education: Not on file  . Highest education level: Not on file  Occupational History  . Occupation: educator   Tobacco Use  . Smoking status: Never Smoker  . Smokeless tobacco: Never Used  Substance and Sexual Activity  . Alcohol use: Yes    Alcohol/week: 0.0 standard drinks    Comment: ocassionally  . Drug use: No  . Sexual activity: Yes    Partners: Male    Birth control/protection: I.U.D.  Other Topics Concern  . Not on file  Social History Narrative   ** Merged History Encounter **       Social Determinants of Health   Financial Resource Strain: Low Risk   . Difficulty of Paying Living Expenses: Not hard at all  Food Insecurity: No Food Insecurity  . Worried About Charity fundraiser in the Last Year: Never true  . Ran Out of Food in the Last Year: Never true  Transportation Needs: No Transportation Needs  . Lack of Transportation (Medical): No  . Lack of Transportation (Non-Medical): No  Physical Activity: Insufficiently Active  . Days of Exercise per Week: 3 days  . Minutes of Exercise per Session: 30 min  Stress: No Stress Concern Present  . Feeling of Stress : Only a little  Social Connections:   . Frequency of Communication with Friends and Family:   . Frequency of Social Gatherings with Friends and Family:   . Attends Religious Services:   . Active Member of Clubs or Organizations:   . Attends Archivist Meetings:   Marland Kitchen Marital Status:   Intimate Partner Violence:   . Fear of Current or Ex-Partner:   . Emotionally Abused:   Marland Kitchen Physically Abused:   . Sexually Abused:      Review of Systems: 12 system ROS is negative except as noted above.   Physical Exam: General:   Alert,  well-nourished, pleasant and cooperative in NAD Head:  Normocephalic and atraumatic. Eyes:  Sclera clear, no icterus.   Conjunctiva pink. Ears:  Normal auditory acuity. Nose:  No deformity, discharge,  or lesions. Mouth:  No deformity or lesions.   Neck:  Supple; no masses or thyromegaly. Lungs:  Clear throughout to auscultation.   No wheezes. Heart:  Regular rate and rhythm; no murmurs. Abdomen:  Soft,nontender, nondistended, normal bowel sounds, no rebound or guarding. No hepatosplenomegaly.   Rectal:  Deferred to time of colonoscopy Msk:  Symmetrical. No boney deformities LAD: No inguinal or umbilical LAD Extremities:  No clubbing or edema. Neurologic:  Alert and  oriented x4;  grossly nonfocal Skin:  Intact without significant lesions or rashes. Psych:  Alert and cooperative. Normal mood and affect.     Aimee Ahlgrim L. Tarri Glenn, MD, MPH 07/25/2019, 2:15 PM

## 2019-07-25 NOTE — Patient Instructions (Addendum)
I recommend using at Metamucil, Benefiber, or Citrucel at least once a day.  I have recommended a colonoscopy for further evaluation. You may need cross-sectional imaging after the colonoscopy if the exam is normal.  Thank you for putting your trust in me today. Please call with any questions before your colonoscopy.    Thank you for trusting me with your gastrointestinal care!    Thornton Park, MD, MPH

## 2019-07-26 LAB — SARS CORONAVIRUS 2 (TAT 6-24 HRS): SARS Coronavirus 2: NEGATIVE

## 2019-07-27 ENCOUNTER — Encounter: Payer: Self-pay | Admitting: Gastroenterology

## 2019-07-27 ENCOUNTER — Ambulatory Visit (AMBULATORY_SURGERY_CENTER): Payer: BC Managed Care – PPO | Admitting: Gastroenterology

## 2019-07-27 ENCOUNTER — Other Ambulatory Visit: Payer: Self-pay

## 2019-07-27 VITALS — BP 102/73 | HR 81 | Temp 96.4°F | Resp 21 | Ht 59.0 in | Wt 187.0 lb

## 2019-07-27 DIAGNOSIS — K621 Rectal polyp: Secondary | ICD-10-CM

## 2019-07-27 DIAGNOSIS — K6289 Other specified diseases of anus and rectum: Secondary | ICD-10-CM

## 2019-07-27 DIAGNOSIS — D128 Benign neoplasm of rectum: Secondary | ICD-10-CM

## 2019-07-27 DIAGNOSIS — K635 Polyp of colon: Secondary | ICD-10-CM

## 2019-07-27 DIAGNOSIS — D125 Benign neoplasm of sigmoid colon: Secondary | ICD-10-CM

## 2019-07-27 DIAGNOSIS — D127 Benign neoplasm of rectosigmoid junction: Secondary | ICD-10-CM | POA: Diagnosis not present

## 2019-07-27 MED ORDER — SODIUM CHLORIDE 0.9 % IV SOLN: 500.0000 mL | Freq: Once | INTRAVENOUS | Status: DC

## 2019-07-27 NOTE — Progress Notes (Signed)
Called to room to assist during endoscopic procedure.  Patient ID and intended procedure confirmed with present staff. Received instructions for my participation in the procedure from the performing physician.  

## 2019-07-27 NOTE — Patient Instructions (Signed)
Discharge instructions given. Handout on polyps. Resume previous medications. See path report for recommendations. YOU HAD AN ENDOSCOPIC PROCEDURE TODAY AT [facility]:   Refer to the procedure report that was given to you for any specific questions about what was found during the examination.  If the procedure report does not answer your questions, please call your gastroenterologist to clarify.  If you requested that your care partner not be given the details of your procedure findings, then the procedure report has been included in a sealed envelope for you to review at your convenience later.  YOU SHOULD EXPECT: Some feelings of bloating in the abdomen. Passage of more gas than usual.  Walking can help get rid of the air that was put into your GI tract during the procedure and reduce the bloating. If you had a lower endoscopy (such as a colonoscopy or flexible sigmoidoscopy) you may notice spotting of blood in your stool or on the toilet paper. If you underwent a bowel prep for your procedure, you may not have a normal bowel movement for a few days.  Please Note:  You might notice some irritation and congestion in your nose or some drainage.  This is from the oxygen used during your procedure.  There is no need for concern and it should clear up in a day or so.  SYMPTOMS TO REPORT IMMEDIATELY:   Following lower endoscopy (colonoscopy or flexible sigmoidoscopy):  Excessive amounts of blood in the stool  Significant tenderness or worsening of abdominal pains  Swelling of the abdomen that is new, acute  Fever of 100F or higher  For urgent or emergent issues, a gastroenterologist can be reached at any hour by calling [phone]. Do not use MyChart messaging for urgent concerns.    DIET:  We do recommend a small meal at first, but then you may proceed to your regular diet.  Drink plenty of fluids but you should avoid alcoholic beverages for 24 hours.  ACTIVITY:  You  should plan to take it easy for the rest of today and you should NOT DRIVE or use heavy machinery until tomorrow (because of the sedation medicines used during the test).    FOLLOW UP: Our staff will call the number listed on your records 48-72 hours following your procedure to check on you and address any questions or concerns that you may have regarding the information given to you following your procedure. If we do not reach you, we will leave a message.  We will attempt to reach you two times.  During this call, we will ask if you have developed any symptoms of COVID 19. If you develop any symptoms (ie: fever, flu-like symptoms, shortness of breath, cough etc.) before then, please call [phone].  If you test positive for Covid 19 in the 2 weeks post procedure, please call and report this information to [place].    If any biopsies were taken you will be contacted by phone or by letter within the next 1-3 weeks.  Please call us at [phone] if you have not heard about the biopsies in 3 weeks.    SIGNATURES/CONFIDENTIALITY: You and/or your care partner have signed paperwork which will be entered into your electronic medical record.  These signatures attest to the fact that that the information above on your After Visit Summary has been reviewed and is understood.  Full responsibility of the confidentiality of this discharge information lies with you and/or your care-partner.

## 2019-07-27 NOTE — Progress Notes (Signed)
Pt Drowsy. VSS. To PACU, report to RN. No anesthetic complications noted.  

## 2019-07-27 NOTE — Op Note (Signed)
[facility] Patient Name: Aimee King Procedure Date: 07/27/2019 11:11 AM MRN: [id number] Endoscopist: Thornton Park MD, MD Age: 39 Referring MD:  Date of Birth: 05-Aug-1980 Gender: Female Account #: [id number] Procedure:                Colonoscopy Indications:              Rectal pain Medicines:                Monitored Anesthesia Care Procedure:                Pre-Anesthesia Assessment:                           - Prior to the procedure, a History and Physical                            was performed, and patient medications and                            allergies were reviewed. The patient's tolerance of                            previous anesthesia was also reviewed. The risks                            and benefits of the procedure and the sedation                            options and risks were discussed with the patient.                            All questions were answered, and informed consent                            was obtained. Prior Anticoagulants: The patient has                            taken no previous anticoagulant or antiplatelet                            agents. ASA Grade Assessment: II - A patient with                            mild systemic disease. After reviewing the risks                            and benefits, the patient was deemed in                            satisfactory condition to undergo the procedure.                           After obtaining informed consent, the colonoscope  was passed under direct vision. Throughout the                            procedure, the patient's blood pressure, pulse, and                            oxygen saturations were monitored continuously. The                            Colonoscope was introduced through the anus and                            advanced to the 4 cm into the ileum. The                            colonoscopy was performed without difficulty. The                             patient tolerated the procedure well. The quality                            of the bowel preparation was good. The terminal                            ileum, ileocecal valve, appendiceal orifice, and                            rectum were photographed. Scope In: 11:27:33 AM Scope Out: 11:43:06 AM Scope Withdrawal Time: 0 hours 12 minutes 58 seconds  Total Procedure Duration: 0 hours 15 minutes 33 seconds  Findings:                 The perianal and digital rectal examinations were                            normal.                           Four flat polyps were found in the rectum and                            sigmoid colon. The polyps were 1-2 mm in size. The                            largest polyp was removed with a cold snare. The                            three smaller polyps were so flat that the snare                            would slide over them, necessitating removal with                            cold biopsy forceps. Resection and  retrieval were                            complete.                           The exam was otherwise without abnormality on                            direct and retroflexion views. Complications:            No immediate complications. Estimated blood loss:                            Minimal. Impression:               - Four 1-2 mm polyps in the rectum and in the                            sigmoid colon, removed with a cold biopsy forceps.                            Resected and retrieved.                           - The examination was otherwise normal on direct                            and retroflexion views. Recommendation:           - Patient has a contact number available for                            emergencies. The signs and symptoms of potential                            delayed complications were discussed with the                            patient. Return to normal activities tomorrow.                             Written discharge instructions were provided to the                            patient.                           - Resume previous diet.                           - Continue present medications.                           - Add a daily stool bulking agent such as psyllium  or methylcellulose.                           - Await pathology results.                           - Repeat colonoscopy date to be determined after                            pending pathology results are reviewed for                            surveillance.                           - Will plan cross-sectional imaging if symptoms                            recur.                           - Emerging evidence supports eating a diet of                            fruits, vegetables, grains, calcium, and yogurt                            while reducing red meat and alcohol may reduce the                            risk of colon cancer. Thornton Park MD, MD 07/27/2019 11:54:52 AM This report has been signed electronically.

## 2019-07-27 NOTE — Progress Notes (Signed)
Temp JB V/S CW 

## 2019-07-31 ENCOUNTER — Telehealth: Payer: Self-pay | Admitting: *Deleted

## 2019-07-31 NOTE — Telephone Encounter (Signed)
  Follow up Call-  Call back number 07/27/2019  Post procedure Call Back phone  # [phone]  Permission to leave phone message Yes  Some recent data might be hidden     Patient questions:  Do you have a fever, pain , or abdominal swelling? No. Pain Score  0 *  Have you tolerated food without any problems? Yes.    Have you been able to return to your normal activities? Yes.    Do you have any questions about your discharge instructions: Diet   No. Medications  No. Follow up visit  No.  Do you have questions or concerns about your Care? No.  Actions: * If pain score is 4 or above: 1. No action needed, pain <4.Have you developed a fever since your procedure? no  2.   Have you had an respiratory symptoms (SOB or cough) since your procedure? no  3.   Have you tested positive for COVID 19 since your procedure no  4.   Have you had any family members/close contacts diagnosed with the COVID 19 since your procedure?  no   If yes to any of these questions please route to Joylene John, RN and Erenest Rasher, RN

## 2019-08-01 ENCOUNTER — Encounter: Payer: Self-pay | Admitting: Gastroenterology

## 2019-08-24 ENCOUNTER — Ambulatory Visit: Payer: BC Managed Care – PPO | Admitting: Podiatry

## 2019-11-19 ENCOUNTER — Other Ambulatory Visit: Payer: Self-pay

## 2019-11-19 ENCOUNTER — Ambulatory Visit
Admission: EM | Admit: 2019-11-19 | Discharge: 2019-11-19 | Disposition: A | Discharge status: Home / Self Care | Payer: BC Managed Care – PPO | Attending: Emergency Medicine | Admitting: Emergency Medicine

## 2019-11-19 DIAGNOSIS — R739 Hyperglycemia, unspecified: Secondary | ICD-10-CM

## 2019-11-19 DIAGNOSIS — R42 Dizziness and giddiness: Secondary | ICD-10-CM

## 2019-11-19 LAB — POCT FASTING CBG KUC MANUAL ENTRY: POCT Glucose (KUC): 129 mg/dL — AB (ref 70–99)

## 2019-11-19 MED ORDER — MECLIZINE HCL 25 MG PO TABS: 25.0000 mg | ORAL_TABLET | Freq: Three times a day (TID) | ORAL | 0 refills | Status: DC | PRN

## 2019-11-19 NOTE — ED Provider Notes (Signed)
[name]    CSN: [id number] Arrival date & time: 11/19/19  0820      History   Chief Complaint Chief Complaint  Patient presents with  . Dizziness    HPI Aimee King is a 39 y.o. female  Presenting for 2-day course of dizziness.  Describes this as a static/off sensation.  Denies room spinning sensation.  Worse with certain positions.  Not related to exertion.  No nausea, vomiting, chest pain or palpitations, difficulty breathing.  Denies syncopal event, change in lifestyle or medications.  Denies fever, neck pain, recent illness or myalgias.  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  Has had dizziness in the past, though "not this bad ".  Has never tried medication for this.  Does endorse history of migraines: No head pain associated.   Past Medical History:  Diagnosis Date  . Acid reflux   . ADD (attention deficit disorder)   . Allergy   . Anemia   . Attention deficit disorder (ADD)   . Depression   . Endometriosis   . IBS (irritable bowel syndrome)     Patient Active Problem List   Diagnosis Date Noted  . Personal history of covid-19 03/30/2019  . Bronchitis with bronchospasm 03/30/2019  . Seasonal allergies 11/25/2018  . Gastroesophageal reflux disease 11/02/2018  . Flu-like symptoms 11/02/2018  . Stress incontinence 11/12/2014  . ADD (attention deficit disorder) 11/12/2014  . Back pain 10/07/2012  . Endometriosis 10/07/2012  . Foot pain, bilateral 10/07/2012  . Stomach pain 10/07/2012    Past Surgical History:  Procedure Laterality Date  . CESAREAN SECTION  2004, 2008  . CESAREAN SECTION    . CHOLECYSTECTOMY  2004  . CHOLECYSTECTOMY    . LAPAROSCOPY  2002   endometriosis  . TUBAL LIGATION  2008  . TUBAL LIGATION      OB History   No obstetric history on file.      Home Medications    Prior to Admission medications   Medication Sig Start Date End Date Taking? Authorizing Provider    amphetamine-dextroamphetamine (ADDERALL XR) 25 MG 24 hr capsule Take 1 capsule by mouth every morning. 06/10/19   Forrest Moron, MD  amphetamine-dextroamphetamine (ADDERALL) 5 MG tablet Take as needed in the afternoon for ADD 06/10/19   Forrest Moron, MD  buPROPion [facility] SR) 150 MG 12 hr tablet Take 1 tablet (150 mg total) by mouth 2 (two) times daily. 12/26/18   Forrest Moron, MD  diclofenac (VOLTAREN) 75 MG EC tablet Take 1 tablet (75 mg total) by mouth 2 (two) times daily. 07/13/19   Trula Slade, DPM  fluticasone (FLONASE) 50 MCG/ACT nasal spray SPRAY 2 SPRAYS INTO EACH NOSTRIL EVERY DAY 05/15/19   Forrest Moron, MD  meclizine (ANTIVERT) 25 MG tablet Take 1 tablet (25 mg total) by mouth 3 (three) times daily as needed for dizziness. 11/19/19   Hall-Potvin, [place], PA-C  pantoprazole (PROTONIX) 40 MG tablet TAKE 1 TABLET BY MOUTH 2 TIMES DAILY BEFORE A MEAL 07/10/19   Forrest Moron, MD  prenatal vitamin w/FE, FA (PRENATAL 1 + 1) 27-1 MG TABS tablet Take 1 tablet by mouth daily before breakfast. 02/07/19   Shelly Bombard, MD  valACYclovir (VALTREX) 1000 MG tablet Take 1 tablet (1,000 mg total) by mouth 3 (three) times daily. Start at onset of symptoms 10/18/17   Forrest Moron, MD  Vitamin D, Ergocalciferol, (DRISDOL) 1.25 MG (50000 UT) CAPS capsule Take  1 capsule (50,000 Units total) by mouth every 7 (seven) days. 01/02/19   Forrest Moron, MD    Family History Family History  Problem Relation Age of Onset  . Hypertension Mother   . Diabetes Mother   . Hypertension Father   . Heart disease Father   . Diabetes Father   . Diabetes Maternal Grandmother   . Kidney disease Maternal Grandfather   . Kidney disease Maternal Uncle   . Colon cancer Neg Hx   . Esophageal cancer Neg Hx   . Rectal cancer Neg Hx   . Stomach cancer Neg Hx     Social History Social History   Tobacco Use  . Smoking status: Never Smoker  . Smokeless tobacco: Never Used  Vaping Use  .  Vaping Use: Never used  Substance Use Topics  . Alcohol use: Yes    Alcohol/week: 0.0 standard drinks    Comment: ocassionally  . Drug use: No     Allergies   Latex, Codeine, Codeine, and Latex   Review of Systems As per HPI   Physical Exam Triage Vital Signs ED Triage Vitals  Enc Vitals Group     BP      Pulse      Resp      Temp      Temp src      SpO2      Weight      Height      Head Circumference      Peak Flow      Pain Score      Pain Loc      Pain Edu?      Excl. in [place]?    No data found.  Updated Vital Signs BP 106/81 (BP Location: Left Arm)   Pulse 91   Temp 97.9 F (36.6 C) (Oral)   Resp 14   LMP 11/10/2019 (Exact Date)   SpO2 97%   Visual Acuity Right Eye Distance:   Left Eye Distance:   Bilateral Distance:    Right Eye Near:   Left Eye Near:    Bilateral Near:     Physical Exam Constitutional:      General: She is not in acute distress. HENT:     Head: Normocephalic and atraumatic.     Right Ear: Tympanic membrane, ear canal and external ear normal.     Left Ear: Tympanic membrane, ear canal and external ear normal.     Mouth/Throat:     Mouth: Mucous membranes are moist.     Pharynx: Oropharynx is clear.  Eyes:     General: No scleral icterus.    Extraocular Movements: Extraocular movements intact.     Conjunctiva/sclera: Conjunctivae normal.     Pupils: Pupils are equal, round, and reactive to light.  Cardiovascular:     Rate and Rhythm: Normal rate.  Pulmonary:     Effort: Pulmonary effort is normal. No respiratory distress.     Breath sounds: No wheezing.  Musculoskeletal:        General: No deformity. Normal range of motion.     Cervical back: Normal range of motion. No rigidity or tenderness.  Lymphadenopathy:     Cervical: No cervical adenopathy.  Skin:    Capillary Refill: Capillary refill takes less than 2 seconds.     Coloration: Skin is not jaundiced.     Findings: No bruising or rash.  Neurological:      Mental Status: She is alert.  Cranial Nerves: Cranial nerves are intact.     Sensory: Sensation is intact.     Motor: Motor function is intact.     Coordination: Coordination is intact.     Gait: Gait is intact.     Comments: Negative head jerk test  Psychiatric:        Mood and Affect: Mood normal.        Behavior: Behavior normal.      UC Treatments / Results  Labs (all labs ordered are listed, but only abnormal results are displayed) Labs Reviewed  POCT FASTING CBG [facility] MANUAL ENTRY - Abnormal; Notable for the following components:      Result Value   POCT Glucose ([facility]) 129 (*)    All other components within normal limits  NOVEL CORONAVIRUS, NAA    EKG   Radiology No results found.  Procedures Procedures (including critical care time)  Medications Ordered in UC Medications - No data to display  Initial Impression / Assessment and Plan / UC Course  I have reviewed the triage vital signs and the nursing notes.  Pertinent labs & imaging results that were available during my care of the patient were reviewed by me and considered in my medical decision making (see chart for details).     Patient afebrile, nontoxic in office today.  No neurocognitive deficit.  CBG 129.  Discussed possible migrainous state VS BPPV.  Will keep track of symptoms, food, trial meclizine, and follow-up with ENT.  Return precautions discussed, pt verbalized understanding and is agreeable to plan. Final Clinical Impressions(s) / UC Diagnoses   Final diagnoses:  Dizziness  Hyperglycemia     Discharge Instructions     Try meclizine for dizziness. Keep track of symptoms and bring to specialist for follow up.    ED Prescriptions    Medication Sig Dispense Auth. Provider   meclizine (ANTIVERT) 25 MG tablet Take 1 tablet (25 mg total) by mouth 3 (three) times daily as needed for dizziness. 30 tablet Hall-Potvin, [place], PA-C     PDMP not reviewed this encounter.   Neldon Mc  [place], [place] 11/19/19 [phone]

## 2019-11-19 NOTE — ED Triage Notes (Signed)
Patient reports for dizziness that started 2 days ago.  She denies nausea or vomiting.

## 2019-11-19 NOTE — Discharge Instructions (Addendum)
Try meclizine for dizziness. Keep track of symptoms and bring to specialist for follow up.

## 2019-11-21 LAB — SARS-COV-2, NAA 2 DAY TAT

## 2019-11-21 LAB — NOVEL CORONAVIRUS, NAA: SARS-CoV-2, NAA: NOT DETECTED

## 2019-12-15 ENCOUNTER — Other Ambulatory Visit: Payer: Self-pay | Admitting: Registered Nurse

## 2019-12-15 DIAGNOSIS — J302 Other seasonal allergic rhinitis: Secondary | ICD-10-CM

## 2020-02-01 ENCOUNTER — Other Ambulatory Visit: Payer: Self-pay | Admitting: Obstetrics

## 2020-08-09 ENCOUNTER — Inpatient Hospital Stay (HOSPITAL_COMMUNITY)
Admission: AD | Admit: 2020-08-09 | Discharge: 2020-08-09 | Disposition: A | Discharge status: Home / Self Care | Payer: BC Managed Care – PPO | Attending: Obstetrics and Gynecology | Admitting: Obstetrics and Gynecology

## 2020-08-09 ENCOUNTER — Other Ambulatory Visit: Payer: Self-pay

## 2020-08-09 DIAGNOSIS — N938 Other specified abnormal uterine and vaginal bleeding: Secondary | ICD-10-CM | POA: Insufficient documentation

## 2020-08-09 DIAGNOSIS — Z3202 Encounter for pregnancy test, result negative: Secondary | ICD-10-CM | POA: Insufficient documentation

## 2020-08-09 LAB — HCG, QUANTITATIVE, PREGNANCY: hCG, Beta Chain, Quant, S: 3 m[IU]/mL (ref <5)

## 2020-08-09 LAB — POCT PREGNANCY, URINE: Preg Test, Ur: NEGATIVE

## 2020-08-09 NOTE — MAU Note (Signed)
Pt ok to leave after labs have been drawn per [facility]. Pt will remain on unit census to follow results and provider will call pt with results. Pt verbalizes understanding.

## 2020-08-09 NOTE — MAU Provider Note (Signed)
Event Date/Time  First Provider Initiated Contact with Patient 08/09/20 1518     S Ms. Aimee King is a 40 y.o. patient who presents to MAU today with vaginal spotting in the setting of multiple home pregnancy tests (n=4).  Patient states her periods are typically heavy "nothing like" the light spotting she is currently experiencing. She is s/p tubal reversal and is concerned for miscarriage.  O BP 120/74 (BP Location: Right Arm)   Pulse 97   Temp 98.6 F (37 C) (Oral)   Resp 16   LMP 07/07/2020   SpO2 97% Comment: room air   Physical Exam Vitals and nursing note reviewed. Exam conducted with a chaperone present.  Cardiovascular:     Rate and Rhythm: Normal rate.  Pulmonary:     Effort: Pulmonary effort is normal.  Abdominal:     General: Abdomen is flat.     Tenderness: There is no abdominal tenderness.  Skin:    Capillary Refill: Capillary refill takes less than 2 seconds.  Neurological:     Mental Status: She is oriented to person, place, and time.  Psychiatric:        Mood and Affect: Mood normal.        Behavior: Behavior normal.    A Medical screening exam complete Non-pregnant patient:  Results for orders placed or performed during the hospital encounter of 08/09/20 (from the past 24 hour(s))  Pregnancy, urine POC     Status: None   Collection Time: 08/09/20  2:59 PM  Result Value Ref Range   Preg Test, Ur NEGATIVE NEGATIVE  hCG, quantitative, pregnancy     Status: None   Collection Time: 08/09/20  3:30 PM  Result Value Ref Range   hCG, Beta Chain, Quant, S 3 <5 mIU/mL   P Discharge from MAU in stable condition Patient may return to MAU as needed for pregnancy-related emergencies  Mallie Snooks, CNM 08/09/2020 6:18 PM

## 2020-08-09 NOTE — MAU Note (Signed)
Pt called back for urine sample

## 2020-08-09 NOTE — MAU Note (Signed)
Aimee King is a 40 y.o. here in MAU reporting: had 4 + UPT last week. Today started having spotting. Having some small clots. Mild abdominal cramping.   LMP: 07/07/20  Onset of complaint: today  Pain score: 1/10  Vitals:   08/09/20 1515  BP: 120/74  Pulse: 97  Resp: 16  Temp: 98.6 F (37 C)  SpO2: 97%     Lab orders placed from triage: upt

## 2021-05-31 ENCOUNTER — Other Ambulatory Visit: Payer: Self-pay

## 2021-05-31 ENCOUNTER — Emergency Department (HOSPITAL_COMMUNITY): Payer: BC Managed Care – PPO

## 2021-05-31 ENCOUNTER — Encounter (HOSPITAL_COMMUNITY): Payer: Self-pay | Admitting: *Deleted

## 2021-05-31 ENCOUNTER — Observation Stay (HOSPITAL_COMMUNITY)
Admission: EM | Admit: 2021-05-31 | Discharge: 2021-06-01 | Disposition: A | Discharge status: Home / Self Care | Payer: BC Managed Care – PPO | Attending: Osteopathic Medicine | Admitting: Osteopathic Medicine

## 2021-05-31 DIAGNOSIS — J45909 Unspecified asthma, uncomplicated: Secondary | ICD-10-CM | POA: Insufficient documentation

## 2021-05-31 DIAGNOSIS — R079 Chest pain, unspecified: Secondary | ICD-10-CM

## 2021-05-31 DIAGNOSIS — D649 Anemia, unspecified: Secondary | ICD-10-CM

## 2021-05-31 DIAGNOSIS — Z91040 Latex allergy status: Secondary | ICD-10-CM | POA: Insufficient documentation

## 2021-05-31 DIAGNOSIS — Z8679 Personal history of other diseases of the circulatory system: Secondary | ICD-10-CM | POA: Insufficient documentation

## 2021-05-31 DIAGNOSIS — E559 Vitamin D deficiency, unspecified: Secondary | ICD-10-CM | POA: Diagnosis present

## 2021-05-31 DIAGNOSIS — D500 Iron deficiency anemia secondary to blood loss (chronic): Principal | ICD-10-CM | POA: Diagnosis present

## 2021-05-31 DIAGNOSIS — N920 Excessive and frequent menstruation with regular cycle: Secondary | ICD-10-CM | POA: Diagnosis not present

## 2021-05-31 DIAGNOSIS — E669 Obesity, unspecified: Secondary | ICD-10-CM

## 2021-05-31 DIAGNOSIS — J302 Other seasonal allergic rhinitis: Secondary | ICD-10-CM | POA: Diagnosis present

## 2021-05-31 DIAGNOSIS — K219 Gastro-esophageal reflux disease without esophagitis: Secondary | ICD-10-CM | POA: Diagnosis present

## 2021-05-31 LAB — BASIC METABOLIC PANEL
Anion gap: 9 (ref 5–15)
BUN: 10 mg/dL (ref 6–20)
CO2: 24 mmol/L (ref 22–32)
Calcium: 8.5 mg/dL — ABNORMAL LOW (ref 8.9–10.3)
Chloride: 103 mmol/L (ref 98–111)
Creatinine, Ser: 0.79 mg/dL (ref 0.44–1.00)
GFR, Estimated: >60 mL/min (ref >60)
Glucose, Bld: 98 mg/dL (ref 70–99)
Potassium: 3.7 mmol/L (ref 3.5–5.1)
Sodium: 136 mmol/L (ref 135–145)

## 2021-05-31 LAB — CBC
HCT: 23 % — ABNORMAL LOW (ref 36.0–46.0)
Hemoglobin: 6.2 g/dL — CL (ref 12.0–15.0)
MCH: 18.7 pg — ABNORMAL LOW (ref 26.0–34.0)
MCHC: 27 g/dL — ABNORMAL LOW (ref 30.0–36.0)
MCV: 69.5 fL — ABNORMAL LOW (ref 80.0–100.0)
Platelets: 281 10*3/uL (ref 150–400)
RBC: 3.31 MIL/uL — ABNORMAL LOW (ref 3.87–5.11)
RDW: 17 % — ABNORMAL HIGH (ref 11.5–15.5)
WBC: 9.6 10*3/uL (ref 4.0–10.5)
nRBC: 0.3 % — ABNORMAL HIGH (ref 0.0–0.2)

## 2021-05-31 LAB — FOLATE: Folate: 18.5 ng/mL (ref >5.9)

## 2021-05-31 LAB — PREPARE RBC (CROSSMATCH)

## 2021-05-31 LAB — IRON AND TIBC
Iron: 13 ug/dL — ABNORMAL LOW (ref 28–170)
Saturation Ratios: 3 % — ABNORMAL LOW (ref 10.4–31.8)
TIBC: 391 ug/dL (ref 250–450)
UIBC: 378 ug/dL

## 2021-05-31 LAB — RETICULOCYTES
Immature Retic Fract: 22.6 % — ABNORMAL HIGH (ref 2.3–15.9)
RBC.: 3.2 MIL/uL — ABNORMAL LOW (ref 3.87–5.11)
Retic Count, Absolute: 53.8 10*3/uL (ref 19.0–186.0)
Retic Ct Pct: 1.7 % (ref 0.4–3.1)

## 2021-05-31 LAB — I-STAT BETA HCG BLOOD, ED (MC, WL, AP ONLY): I-stat hCG, quantitative: <5 m[IU]/mL (ref <5)

## 2021-05-31 LAB — D-DIMER, QUANTITATIVE: D-Dimer, Quant: 0.46 ug/mL-FEU (ref 0.00–0.50)

## 2021-05-31 LAB — TROPONIN I (HIGH SENSITIVITY)
Troponin I (High Sensitivity): 2 ng/L (ref <18)
Troponin I (High Sensitivity): 2 ng/L (ref <18)

## 2021-05-31 LAB — ABO/RH: ABO/RH(D): O POS

## 2021-05-31 LAB — VITAMIN B12: Vitamin B-12: 387 pg/mL (ref 180–914)

## 2021-05-31 LAB — FERRITIN: Ferritin: 1 ng/mL — ABNORMAL LOW (ref 11–307)

## 2021-05-31 LAB — POC OCCULT BLOOD, ED: Fecal Occult Bld: NEGATIVE

## 2021-05-31 MED ORDER — SODIUM CHLORIDE 0.9 % IV SOLN
25.0000 mg | Freq: Once | INTRAVENOUS | Status: AC
  Administered 2021-06-01: 25 mg via INTRAVENOUS
  Filled 2021-05-31: qty 0.5

## 2021-05-31 MED ORDER — ONDANSETRON HCL 4 MG PO TABS: 4.0000 mg | ORAL_TABLET | Freq: Four times a day (QID) | ORAL | Status: DC | PRN

## 2021-05-31 MED ORDER — ACETAMINOPHEN 650 MG RE SUPP
650.0000 mg | Freq: Four times a day (QID) | RECTAL | Status: DC | PRN
Start: 2021-05-31 — End: 2021-06-01

## 2021-05-31 MED ORDER — PANTOPRAZOLE SODIUM 40 MG IV SOLR
40.0000 mg | Freq: Once | INTRAVENOUS | Status: AC
  Administered 2021-05-31: 40 mg via INTRAVENOUS
  Filled 2021-05-31: qty 10

## 2021-05-31 MED ORDER — FERROUS SULFATE 325 (65 FE) MG PO TABS
325.0000 mg | ORAL_TABLET | Freq: Three times a day (TID) | ORAL | Status: DC
  Administered 2021-06-01 (×2): 325 mg via ORAL
  Filled 2021-05-31 (×3): qty 1

## 2021-05-31 MED ORDER — SODIUM CHLORIDE 0.9 % IV SOLN: 10.0000 mL/h | Freq: Once | INTRAVENOUS | Status: DC

## 2021-05-31 MED ORDER — IOHEXOL 350 MG/ML SOLN
100.0000 mL | Freq: Once | INTRAVENOUS | Status: AC | PRN
  Administered 2021-05-31: 100 mL via INTRAVENOUS

## 2021-05-31 MED ORDER — MORPHINE SULFATE (PF) 4 MG/ML IV SOLN
4.0000 mg | Freq: Once | INTRAVENOUS | Status: AC
  Administered 2021-05-31: 4 mg via INTRAVENOUS
  Filled 2021-05-31: qty 1

## 2021-05-31 MED ORDER — ONDANSETRON HCL 4 MG/2ML IJ SOLN
4.0000 mg | Freq: Once | INTRAMUSCULAR | Status: AC
  Administered 2021-05-31: 4 mg via INTRAVENOUS
  Filled 2021-05-31: qty 2

## 2021-05-31 MED ORDER — ACETAMINOPHEN 325 MG PO TABS: 650.0000 mg | ORAL_TABLET | Freq: Four times a day (QID) | ORAL | Status: DC | PRN

## 2021-05-31 MED ORDER — BUPROPION HCL ER (XL) 150 MG PO TB24
150.0000 mg | ORAL_TABLET | Freq: Every day | ORAL | Status: DC
  Administered 2021-05-31 – 2021-06-01 (×2): 150 mg via ORAL
  Filled 2021-05-31 (×2): qty 1

## 2021-05-31 MED ORDER — SODIUM CHLORIDE (PF) 0.9 % IJ SOLN
INTRAMUSCULAR | Status: AC
  Filled 2021-05-31: qty 50

## 2021-05-31 MED ORDER — SODIUM CHLORIDE 0.9 % IV SOLN
1000.0000 mg | Freq: Once | INTRAVENOUS | Status: AC
  Administered 2021-06-01: 1000 mg via INTRAVENOUS
  Filled 2021-05-31: qty 20

## 2021-05-31 MED ORDER — ONDANSETRON HCL 4 MG/2ML IJ SOLN
4.0000 mg | Freq: Four times a day (QID) | INTRAMUSCULAR | Status: DC | PRN
Start: 2021-05-31 — End: 2021-06-01
  Administered 2021-05-31: 4 mg via INTRAVENOUS
  Filled 2021-05-31: qty 2

## 2021-05-31 NOTE — ED Notes (Signed)
Critical Value: Hgb 6.2 ?

## 2021-05-31 NOTE — ED Notes (Signed)
MD notified of critical value  

## 2021-05-31 NOTE — ED Notes (Signed)
Vomited 500 mLs of green/watery emesis. ?

## 2021-05-31 NOTE — ED Notes (Signed)
Per YTK@ [facility]- lavender tube needed for collection re: confirmatory bld type. RN advised. ENMiles ?

## 2021-05-31 NOTE — ED Triage Notes (Signed)
Chest pain follow by heaviness and left arm pain. States heart feels like it has been beating harder than normal" at intervals over last week. ?

## 2021-05-31 NOTE — ED Provider Notes (Signed)
?[facility] DEPT ?Provider Note ? ? ?CSN: [id number] ?Arrival date & time: 05/31/21  1337 ? ?  ? ?History ? ?Chief Complaint  ?Patient presents with  ? Chest Pain  ? ? ?Aimee King is a 41 y.o. female. ? ? ?Chest Pain ?Associated symptoms: no fever   ? ?HPI: A 41 year old patient with a history of obesity presents for evaluation of chest pain. Initial onset of pain was less than one hour ago. The patient's chest pain is not worse with exertion. The patient complains of nausea. The patient's chest pain is middle- or left-sided, is not well-localized, is not described as heaviness/pressure/tightness, is not sharp and does radiate to the arms/jaw/neck. The patient denies diaphoresis. The patient has no history of stroke, has no history of peripheral artery disease, has not smoked in the past 90 days, denies any history of treated diabetes, has no relevant family history of coronary artery disease (first degree relative at less than age 74), is not hypertensive and has no history of hypercholesterolemia.  ? ?Home Medications ?Prior to Admission medications   ?Not on File  ?   ? ?Allergies    ?Latex, Codeine, Codeine, and Latex   ? ?Review of Systems   ?Review of Systems  ?Constitutional:  Negative for fever.  ?Cardiovascular:  Positive for chest pain.  ?Genitourinary:  Positive for menstrual problem.  ?     Patient has been having very heavy menstrual periods.  She denies any bleeding at this time  ? ?Physical Exam ?Updated Vital Signs ?BP 101/70   Pulse 80   Resp 10   Ht 1.499 m ('4\' 11"'$ )   Wt 83 kg   SpO2 100%   BMI 36.96 kg/m?  ?Physical Exam ?Vitals and nursing note reviewed.  ?Constitutional:   ?   General: She is not in acute distress. ?   Appearance: She is well-developed.  ?HENT:  ?   Head: Normocephalic and atraumatic.  ?   Right Ear: External ear normal.  ?   Left Ear: External ear normal.  ?Eyes:  ?   General: No scleral icterus.    ?   Right eye: No discharge.      ?   Left eye: No discharge.  ?   Conjunctiva/sclera: Conjunctivae normal.  ?Neck:  ?   Trachea: No tracheal deviation.  ?Cardiovascular:  ?   Rate and Rhythm: Normal rate and regular rhythm.  ?Pulmonary:  ?   Effort: Pulmonary effort is normal. No respiratory distress.  ?   Breath sounds: Normal breath sounds. No stridor. No wheezing or rales.  ?Abdominal:  ?   General: Bowel sounds are normal. There is no distension.  ?   Palpations: Abdomen is soft.  ?   Tenderness: There is no abdominal tenderness. There is no guarding or rebound.  ?Genitourinary: ?   Comments: No melena ?Musculoskeletal:     ?   General: No tenderness or deformity.  ?   Cervical back: Neck supple.  ?Skin: ?   General: Skin is warm and dry.  ?   Findings: No rash.  ?Neurological:  ?   General: No focal deficit present.  ?   Mental Status: She is alert.  ?   Cranial Nerves: No cranial nerve deficit (no facial droop, extraocular movements intact, no slurred speech).  ?   Sensory: No sensory deficit.  ?   Motor: No abnormal muscle tone or seizure activity.  ?   Coordination: Coordination normal.  ?  Comments: Normal sensation, good grip strength bilaterally  ?Psychiatric:     ?   Mood and Affect: Mood normal.  ? ? ?ED Results / Procedures / Treatments   ?Labs ?(all labs ordered are listed, but only abnormal results are displayed) ?Labs Reviewed  ?BASIC METABOLIC PANEL - Abnormal; Notable for the following components:  ?    Result Value  ? Calcium 8.5 (*)   ? All other components within normal limits  ?CBC - Abnormal; Notable for the following components:  ? RBC 3.31 (*)   ? Hemoglobin 6.2 (*)   ? HCT 23.0 (*)   ? MCV 69.5 (*)   ? MCH 18.7 (*)   ? MCHC 27.0 (*)   ? RDW 17.0 (*)   ? nRBC 0.3 (*)   ? All other components within normal limits  ?D-DIMER, QUANTITATIVE  ?VITAMIN B12  ?FOLATE  ?IRON AND TIBC  ?FERRITIN  ?RETICULOCYTES  ?I-STAT BETA HCG BLOOD, ED ([facility], [facility], [facility] ONLY)  ?POC OCCULT BLOOD, ED  ?PREPARE RBC (CROSSMATCH)  ?TROPONIN I (HIGH  SENSITIVITY)  ?TROPONIN I (HIGH SENSITIVITY)  ? ? ?EKG ?None ? ?Radiology ?DG Chest 2 View ? ?Result Date: 05/31/2021 ?CLINICAL DATA:  Chest pain EXAM: CHEST - 2 VIEW COMPARISON:  03/30/2019 FINDINGS: The lungs are clear without focal pneumonia, edema, pneumothorax or pleural effusion. The cardiopericardial silhouette is within normal limits for size. The visualized bony structures of the thorax are unremarkable. Telemetry leads overlie the chest. IMPRESSION: No acute cardiopulmonary findings. Electronically Signed   By: Misty Stanley M.D.   On: 05/31/2021 14:38   ? ?Procedures ?Marland KitchenCritical Care ?Performed by: Dorie Rank, MD ?Authorized by: Dorie Rank, MD  ? ?Critical care provider statement:  ?  Critical care time (minutes):  30 ?  Critical care was time spent personally by me on the following activities:  Development of treatment plan with patient or surrogate, discussions with consultants, evaluation of patient's response to treatment, examination of patient, ordering and review of laboratory studies, ordering and review of radiographic studies, ordering and performing treatments and interventions, pulse oximetry, re-evaluation of patient's condition and review of old charts  ? ? ?Medications Ordered in ED ?Medications  ?0.9 %  sodium chloride infusion (has no administration in time range)  ?pantoprazole (PROTONIX) injection 40 mg (40 mg Intravenous Given 05/31/21 1412)  ?morphine (PF) 4 MG/ML injection 4 mg (4 mg Intravenous Given 05/31/21 1414)  ?ondansetron (ZOFRAN) injection 4 mg (4 mg Intravenous Given 05/31/21 1413)  ?morphine (PF) 4 MG/ML injection 4 mg (4 mg Intravenous Given 05/31/21 1519)  ? ? ?ED Course/ Medical Decision Making/ A&P ?Clinical Course as of 05/31/21 1522  ?Sat May 31, 2021  ?1459 CBC(!!) ?Hemoglobin decreased at 6.2 [JK]  ?8299 Basic metabolic panel(!) ?Normal [JK]  ?1459 Troponin I (High Sensitivity) ?Normal [JK]  ?1459 D-dimer, quantitative [JK]  ?  ?Clinical Course User Index ?[JK] Dorie Rank, MD   ? ?HEAR Score: 2                       ?Medical Decision Making ?Amount and/or Complexity of Data Reviewed ?Labs: ordered. Decision-making details documented in ED Course. ?Radiology: ordered. ? ?Risk ?Prescription drug management. ? ? ?Chest pain ?Low risk heart score.  Initial troponin is normal.  Will wait on delta troponin.  Patient has been given pain medications.  Will hold off on aspirin with her anemia.  With her persistent pain concerned about the possibility of acute aortic syndrome.  We will proceed with CT angiogram ? ?Anemia ?Low MCV suggests chronic nature to her anemia.  Patient has not noticed any blood in her stool.  Point-of-care fecal occult has been performed.  Patient does have very heavy menstrual periods.  With her low MCV this is likely cause of her anemia. Will send off anemia panel.   With her hemoglobin at 6.2, we will proceed with blood transfusion.  Patient has been having fatigue decreased energy.  Unclear if her chest pain is related to anemia but we will proceed with transfusion ? ?Care turned over to Dr Francia Greaves. ? ? ? ? ? ? ?Final Clinical Impression(s) / ED Diagnoses ?Final diagnoses:  ?Chest pain, unspecified type  ?Anemia, unspecified type  ? ?  ?Dorie Rank, MD ?05/31/21 1522 ? ?

## 2021-05-31 NOTE — ED Provider Notes (Signed)
Patient seen after prior EDP. ? ?Patient is agreeable with plan for admission. ? ?Patient with likely symptomatic anemia. ? ?Hospitalist service is aware of case and will evaluate for admission. ?  ?Valarie Merino, MD ?05/31/21 1621 ? ?

## 2021-05-31 NOTE — H&P (Signed)
?History and Physical  ? ? ?Patient: Aimee King KGY:[id number] DOB: 13-May-1980 ?DOA: 05/31/2021 ?DOS: the patient was seen and examined on 05/31/2021 ?PCP: Dorna Mai, MD  ?Patient coming from: Home ? ?Chief Complaint:  ?Chief Complaint  ?Patient presents with  ? Chest Pain  ? ?HPI: Aimee King is a 41 y.o. female with medical history significant of GERD, ADD, seasonal allergies, reactive airway disease, iron deficiency anemia, endometriosis, depression, IBS who is coming to the emergency department complaints of elbow burning cramp sensation in her left-sided chest radiated to her left arm that happened while she was sitting at home.  It was associated with dyspnea and nausea.  Denied diaphoresis, history of PND, orthopnea or pitting edema of the lower extremities.  She has been experiencing some fatigue and somnolence, but no symptoms like this.  She endorses having "heavy menstrual periods" to the point that she has had to use adult diapers to avoid "any accidents".  Denies fever, headache, chills, have recent rhinorrhea and cough due to seasonal allergies/reactive airways disease. ? ?ED course: Initial vital signs were temperature , Pulse 86, respiration 15, BP 120/88 and O2 sat 100% on room air.  The patient received Protonix 40 mg IVP, morphine 4 mg IVP and Zofran 4 mg IVP. ? ?Lab work: CBC is her white count 9.6, hemoglobin 6.2 g/dL platelets 231.  D-dimer 0.45.  EKG nonischemic.  Troponin x2 normal.  Fecal occult blood was negative.  BMP with a calcium 8.5 mg/dL, but otherwise normal.  Folate and vitamin B12 are normal.  Iron level was 13 and saturation radios were 3%.  Her ferritin level was 1 ng/mL. ? ?Imaging: Two-view chest radiograph with no acute cardiopulmonary findings.  CTA with no evidence for acute intramural hematoma or dissection of the thoracic aorta.  There were no acute findings in the chest. ? ?Review of Systems: As mentioned in the history of present illness. All  other systems reviewed and are negative. ?Past Medical History:  ?Diagnosis Date  ? Acid reflux   ? ADD (attention deficit disorder)   ? Allergy   ? Anemia   ? Attention deficit disorder (ADD)   ? Depression   ? Endometriosis   ? IBS (irritable bowel syndrome)   ? ?Past Surgical History:  ?Procedure Laterality Date  ? CESAREAN SECTION  2004, 2008  ? CESAREAN SECTION    ? CHOLECYSTECTOMY  2004  ? CHOLECYSTECTOMY    ? LAPAROSCOPY  2002  ? endometriosis  ? TUBAL LIGATION  2008  ? TUBAL LIGATION    ? ?Social History:  reports that she has never smoked. She has never used smokeless tobacco. She reports current alcohol use. She reports that she does not use drugs. ? ?Allergies  ?Allergen Reactions  ? Latex Swelling  ? Codeine Nausea And Vomiting  ? Codeine Nausea And Vomiting  ? Latex   ?  Feels like little cuts in her vagina.  ? ? ?Family History  ?Problem Relation Age of Onset  ? Hypertension Mother   ? Diabetes Mother   ? Hypertension Father   ? Heart disease Father   ? Diabetes Father   ? Diabetes Maternal Grandmother   ? Kidney disease Maternal Grandfather   ? Kidney disease Maternal Uncle   ? Colon cancer Neg Hx   ? Esophageal cancer Neg Hx   ? Rectal cancer Neg Hx   ? Stomach cancer Neg Hx   ? ? ?Prior to Admission medications   ?Medication Sig Start Date  End Date Taking? Authorizing Provider  ?acetaminophen (TYLENOL) 500 MG tablet Take 500 mg by mouth every 6 (six) hours as needed for moderate pain.   Yes [provider]  ?albuterol (VENTOLIN HFA) 108 (90 Base) MCG/ACT inhaler Inhale into the lungs every 6 (six) hours as needed for wheezing or shortness of breath.   Yes [provider]  ?buPROPion (WELLBUTRIN XL) 150 MG 24 hr tablet Take 150 mg by mouth daily.   Yes [provider]  ?fexofenadine (ALLEGRA) 180 MG tablet Take 180 mg by mouth daily.   Yes [provider]  ? ? ?Physical Exam: ?Vitals:  ? 05/31/21 1415 05/31/21 1515 05/31/21 1530 05/31/21 1600  ?BP:  101/70 123/81  115/77  ?Pulse: 89 80 86 85  ?Resp: '18 10 16 15  '$ ?SpO2: 100% 100% 100% 100%  ?Weight:      ?Height:      ? ?Physical Exam ?Vitals and nursing note reviewed.  ?Constitutional:   ?   Appearance: She is well-developed. She is obese.  ?HENT:  ?   Head: Normocephalic.  ?   Mouth/Throat:  ?   Mouth: Mucous membranes are moist.  ?Eyes:  ?   Pupils: Pupils are equal, round, and reactive to light.  ?   Comments: Positive conjunctival pallor.  ?Neck:  ?   Vascular: No JVD.  ?Cardiovascular:  ?   Rate and Rhythm: Normal rate and regular rhythm.  ?   Heart sounds: S1 normal and S2 normal.  ?Pulmonary:  ?   Effort: Pulmonary effort is normal.  ?   Breath sounds: Normal breath sounds. No wheezing, rhonchi or rales.  ?Abdominal:  ?   General: Bowel sounds are normal. There is no distension.  ?   Palpations: Abdomen is soft.  ?   Tenderness: There is no abdominal tenderness.  ?Musculoskeletal:  ?   Cervical back: Neck supple.  ?   Right lower leg: No edema.  ?   Left lower leg: No edema.  ?Skin: ?   General: Skin is warm and dry.  ?Neurological:  ?   General: No focal deficit present.  ?   Mental Status: She is alert and oriented to person, place, and time.  ?Psychiatric:     ?   Mood and Affect: Mood normal.     ?   Behavior: Behavior normal.  ? ?Data Reviewed: ? ? ?There are no new results to review at this time. ? ?Assessment and Plan: ?Principal Problem: ?  Symptomatic anemia ?Secondary to ?  Iron deficiency anemia due to chronic blood loss ?Due to ?  Menorrhagia ?Observation/telemetry. ?Continue PRBC transfusion. ?Follow-up posttransfusion CBC. ?Will discharge on iron supplementation. ?Advised to follow-up with GYN as an outpatient. ?May benefit from planned menstrual cycling with progesterone. ? ?Active Problems: ?  Gastroesophageal reflux disease ?No significant issues lately. ? ?  Seasonal allergies ?  Reactive airway disease ?Asymptomatic at this time. ?Antihistamines and albuterol MDI as needed. ? ?  Vitamin D  deficiency ?Calcium level was mildly low. ?Recheck calcium 11+ albumin level in the morning. ? ?  Class 2 obesity ?Lifestyle modifications. ?Will be establishing with new PCP on 06/12/2021. ? ? ? ? Advance Care Planning:   Code Status: Full Code  ? ?Consults:  ? ?Family Communication:  ? ?Severity of Illness: ?The appropriate patient status for this patient is OBSERVATION. Observation status is judged to be reasonable and necessary in order to provide the required intensity of service to ensure the patient's safety.  The patient's presenting symptoms, physical exam findings, and initial radiographic and laboratory data in the context of their medical condition is felt to place them at decreased risk for further clinical deterioration. Furthermore, it is anticipated that the patient will be medically stable for discharge from the hospital within 2 midnights of admission.  ? ?Author: ?Reubin Milan, MD ?05/31/2021 5:04 PM ? ?For on call review [email or web address].  ? ?This document was prepared using [employer] and may contain some unintended transcription errors. ?

## 2021-06-01 ENCOUNTER — Other Ambulatory Visit: Payer: Self-pay

## 2021-06-01 DIAGNOSIS — D649 Anemia, unspecified: Secondary | ICD-10-CM | POA: Diagnosis not present

## 2021-06-01 LAB — COMPREHENSIVE METABOLIC PANEL
ALT: 14 U/L (ref 0–44)
AST: 15 U/L (ref 15–41)
Albumin: 3 g/dL — ABNORMAL LOW (ref 3.5–5.0)
Alkaline Phosphatase: 54 U/L (ref 38–126)
Anion gap: 7 (ref 5–15)
BUN: 8 mg/dL (ref 6–20)
CO2: 26 mmol/L (ref 22–32)
Calcium: 8.2 mg/dL — ABNORMAL LOW (ref 8.9–10.3)
Chloride: 104 mmol/L (ref 98–111)
Creatinine, Ser: 0.78 mg/dL (ref 0.44–1.00)
GFR, Estimated: >60 mL/min (ref >60)
Glucose, Bld: 96 mg/dL (ref 70–99)
Potassium: 3.8 mmol/L (ref 3.5–5.1)
Sodium: 137 mmol/L (ref 135–145)
Total Bilirubin: 0.3 mg/dL (ref 0.3–1.2)
Total Protein: 6.5 g/dL (ref 6.5–8.1)

## 2021-06-01 LAB — CBC
HCT: 26.9 % — ABNORMAL LOW (ref 36.0–46.0)
Hemoglobin: 8 g/dL — ABNORMAL LOW (ref 12.0–15.0)
MCH: 22.3 pg — ABNORMAL LOW (ref 26.0–34.0)
MCHC: 29.7 g/dL — ABNORMAL LOW (ref 30.0–36.0)
MCV: 74.9 fL — ABNORMAL LOW (ref 80.0–100.0)
Platelets: 216 10*3/uL (ref 150–400)
RBC: 3.59 MIL/uL — ABNORMAL LOW (ref 3.87–5.11)
RDW: 20.2 % — ABNORMAL HIGH (ref 11.5–15.5)
WBC: 9.4 10*3/uL (ref 4.0–10.5)
nRBC: 0.3 % — ABNORMAL HIGH (ref 0.0–0.2)

## 2021-06-01 LAB — HIV ANTIBODY (ROUTINE TESTING W REFLEX): HIV Screen 4th Generation wRfx: NONREACTIVE

## 2021-06-01 MED ORDER — FERROUS SULFATE 325 (65 FE) MG PO TABS: 325.0000 mg | ORAL_TABLET | Freq: Three times a day (TID) | ORAL | 0 refills | Status: DC

## 2021-06-01 NOTE — Discharge Summary (Signed)
?Physician Discharge Summary  ?Aimee King PYP:[id number] DOB: 1980-05-10 DOA: 05/31/2021 ? ?PCP: Dorna Mai, MD ? ?Admit date: 05/31/2021 ?Discharge date: 06/01/2021 ? ?Admitted From: home ?Disposition: home to self care  ? ?Recommendations for Outpatient Follow-up:  ?Follow up with PCP in 1-2 weeks ?Please obtain labs/tests: CBC ?Please follow up on the following pending results: none ?Please confirm adequate control of menstrual bleeding. Pt was offered Rx for OCP however she states goal of becoming pregnant, advised preconception counseling w/ OBGYN, PNV w/ iron  ? ?Home Health:none  ?Equipment/Devices:none ? ?Discharge Condition: good  ?CODE STATUS: FULL  ?Diet recommendation: Regular ? ? ? ?Admitting diagnosis: Symptomatic anemia ? ?Discharge Diagnoses:  ?Principal Problem: ?  Symptomatic anemia ?Active Problems: ?  Gastroesophageal reflux disease ?  Seasonal allergies ?  Vitamin D deficiency ?  Reactive airway disease ?  Iron deficiency anemia due to chronic blood loss ?  Menorrhagia ?  Class 2 obesity ? ?Brief/Interim Summary: Patient was admitted to the hospital for chest pressure in the setting of anemia, hemoglobin was 6.2 on presentation to the ED 05/31/2021, received 2 units PRBCs, hemoglobin improved to 8.0, patient examined in the morning of 06/01/2021 and reports resolution of above symptoms, she is receiving iron transfusion and feels safe to go home.  All questions answered. ? ? ? ? ?Discharge Instructions ? ?Discharge Instructions   ? ? Diet - low sodium heart healthy   Complete by: As directed ?  ? Discharge instructions   Complete by: As directed ?  ? Patient needs to call her primary care or OB/GYN to discuss treatment for heavy menstrual bleeding.  Patient should follow for preconception counseling with OB/GYN  ? Increase activity slowly   Complete by: As directed ?  ? ?  ? ?Allergies as of 06/01/2021   ? ?   Reactions  ? Latex Swelling  ? Codeine Nausea And Vomiting  ? Codeine Nausea And  Vomiting  ? Latex   ? Feels like little cuts in her vagina.  ? ?  ? ?  ?Medication List  ?  ? ?TAKE these medications   ? ?acetaminophen 500 MG tablet ?Commonly known as: TYLENOL ?Take 500 mg by mouth every 6 (six) hours as needed for moderate pain. ?  ?albuterol 108 (90 Base) MCG/ACT inhaler ?Commonly known as: VENTOLIN HFA ?Inhale into the lungs every 6 (six) hours as needed for wheezing or shortness of breath. ?  ?buPROPion 150 MG 24 hr tablet ?Commonly known as: WELLBUTRIN XL ?Take 150 mg by mouth daily. ?  ?ferrous sulfate 325 (65 FE) MG tablet ?Take 1 tablet (325 mg total) by mouth 3 (three) times daily with meals. Ok to take once per day or once every other day if constipation develops ?  ?fexofenadine 180 MG tablet ?Commonly known as: ALLEGRA ?Take 180 mg by mouth daily. ?  ? ?  ? ? ? ?Allergies  ?Allergen Reactions  ? Latex Swelling  ? Codeine Nausea And Vomiting  ? Codeine Nausea And Vomiting  ? Latex   ?  Feels like little cuts in her vagina.  ? ? ?Consultations: ?none ? ? ?Procedures/Studies: ?DG Chest 2 View ? ?Result Date: 05/31/2021 ?CLINICAL DATA:  Chest pain EXAM: CHEST - 2 VIEW COMPARISON:  03/30/2019 FINDINGS: The lungs are clear without focal pneumonia, edema, pneumothorax or pleural effusion. The cardiopericardial silhouette is within normal limits for size. The visualized bony structures of the thorax are unremarkable. Telemetry leads overlie the chest. IMPRESSION: No acute cardiopulmonary findings.  Electronically Signed   By: Misty Stanley M.D.   On: 05/31/2021 14:38  ? ?CT Angio Chest Aorta W and/or Wo Contrast ? ?Result Date: 05/31/2021 ?CLINICAL DATA:  Chest pain.  Acute aortic syndrome. EXAM: CT ANGIOGRAPHY CHEST WITH CONTRAST TECHNIQUE: Multidetector CT imaging of the chest was performed using the standard protocol during bolus administration of intravenous contrast. Multiplanar CT image reconstructions and MIPs were obtained to evaluate the vascular anatomy. RADIATION DOSE REDUCTION: This  exam was performed according to the departmental dose-optimization program which includes automated exposure control, adjustment of the mA and/or kV according to patient size and/or use of iterative reconstruction technique. CONTRAST:  118m OMNIPAQUE IOHEXOL 350 MG/ML SOLN COMPARISON:  None. FINDINGS: Cardiovascular: Pre contrast imaging shows no dense crescent in the wall of the thoracic aorta to suggest acute intramural hematoma. No thoracic aortic aneurysm. No dissection of the thoracic aorta. The heart size is normal. No substantial pericardial effusion. Mediastinum/Nodes: No mediastinal lymphadenopathy. There is no hilar lymphadenopathy. The esophagus has normal imaging features. There is no axillary lymphadenopathy. Lungs/Pleura: No suspicious pulmonary nodule or mass. No focal airspace consolidation. No pleural effusion. Upper Abdomen: Unremarkable. Musculoskeletal: No worrisome lytic or sclerotic osseous abnormality. Review of the MIP images confirms the above findings. IMPRESSION: 1. No CT evidence for acute intramural hematoma or dissection of the thoracic aorta. No thoracic aortic aneurysm. 2. No acute findings in the chest. Electronically Signed   By: EMisty StanleyM.D.   On: 05/31/2021 15:59   ? ? ? ? ?Subjective: Patient seen and examined at bedside this morning, states symptoms of chest pressure have resolved, no chest pain/SOB, no dizziness/lightheadedness, no other complaints or concerns. ? ? ?Discharge Exam: ?Vitals:  ? 06/01/21 0541 06/01/21 1209  ?BP: 108/62 106/76  ?Pulse: 71 71  ?Resp: 18 16  ?Temp: 97.9 ?F (36.6 ?C) 97.8 ?F (36.6 ?C)  ?SpO2: 100% 100%  ? ?Vitals:  ? 06/01/21 0045 06/01/21 0339 06/01/21 0541 06/01/21 1209  ?BP: 101/64 97/63 108/62 106/76  ?Pulse: 78 75 71 71  ?Resp: '18 18 18 16  '$ ?Temp: 98.2 ?F (36.8 ?C) 98.2 ?F (36.8 ?C) 97.9 ?F (36.6 ?C) 97.8 ?F (36.6 ?C)  ?TempSrc:  Oral Oral Oral  ?SpO2: 100%  100% 100%  ?Weight:      ?Height:      ? ? ?General: Pt is alert, awake, not in  acute distress ?Cardiovascular: RRR, S1/S2 +, no rubs, no gallops ?Respiratory: CTA bilaterally, no wheezing, no rhonchi ?Abdominal: Soft, NT, ND, bowel sounds + ?Extremities: no edema, no cyanosis ? ? ? ? ?The results of significant diagnostics from this hospitalization (including imaging, microbiology, ancillary and laboratory) are listed below for reference.   ? ? ?Microbiology: ?No results found for this or any previous visit (from the past 240 hour(s)).  ? ?Labs: ?BNP (last 3 results) ?No results for input(s): BNP in the last 8760 hours. ?Basic Metabolic Panel: ?Recent Labs  ?Lab 05/31/21 ?1410 06/01/21 ?09622 ?NA 136 137  ?K 3.7 3.8  ?CL 103 104  ?CO2 24 26  ?GLUCOSE 98 96  ?BUN 10 8  ?CREATININE 0.79 0.78  ?CALCIUM 8.5* 8.2*  ? ?Liver Function Tests: ?Recent Labs  ?Lab 06/01/21 ?02979 ?AST 15  ?ALT 14  ?ALKPHOS 54  ?BILITOT 0.3  ?PROT 6.5  ?ALBUMIN 3.0*  ? ?No results for input(s): LIPASE, AMYLASE in the last 168 hours. ?No results for input(s): AMMONIA in the last 168 hours. ?CBC: ?Recent Labs  ?Lab 05/31/21 ?1410 06/01/21 ?08921 ?  WBC 9.6 9.4  ?HGB 6.2* 8.0*  ?HCT 23.0* 26.9*  ?MCV 69.5* 74.9*  ?PLT 281 216  ? ?Cardiac Enzymes: ?No results for input(s): CKTOTAL, CKMB, CKMBINDEX, TROPONINI in the last 168 hours. ?BNP: ?Invalid input(s): POCBNP ?CBG: ?No results for input(s): GLUCAP in the last 168 hours. ?D-Dimer ?Recent Labs  ?  05/31/21 ?1410  ?DDIMER 0.46  ? ?Hgb A1c ?No results for input(s): HGBA1C in the last 72 hours. ?Lipid Profile ?No results for input(s): CHOL, HDL, LDLCALC, TRIG, CHOLHDL, LDLDIRECT in the last 72 hours. ?Thyroid function studies ?No results for input(s): TSH, T4TOTAL, T3FREE, THYROIDAB in the last 72 hours. ? ?Invalid input(s): FREET3 ?Anemia work up ?Recent Labs  ?  05/31/21 ?1533  ?VITAMINB12 387  ?FOLATE 18.5  ?FERRITIN 1*  ?TIBC 391  ?IRON 13*  ?RETICCTPCT 1.7  ? ?Urinalysis ?   ?Component Value Date/Time  ? BILIRUBINUR negative 06/21/2019 1535  ? BILIRUBINUR neg 11/12/2014 1523   ? KETONESUR negative 06/21/2019 1535  ? PROTEINUR negative 06/21/2019 1535  ? PROTEINUR neg 11/12/2014 1523  ? UROBILINOGEN 0.2 06/21/2019 1535  ? NITRITE Negative 06/21/2019 1535  ? NITRITE neg 09/19/

## 2021-06-01 NOTE — Progress Notes (Signed)
Assessment unchanged. Pt verbalized understanding of dc instructions through teach back. IV out. Discharged to front entrance via foot per request. ?

## 2021-06-01 NOTE — Plan of Care (Signed)
?Problem: Education: ?Goal: Knowledge of General Education information will improve ?Description: Including pain rating scale, medication(s)/side effects and non-pharmacologic comfort measures ?Outcome: Adequate for Discharge ?  ?Problem: Health Behavior/Discharge Planning: ?Goal: Ability to manage health-related needs will improve ?Outcome: Adequate for Discharge ?  ?Problem: Clinical Measurements: ?Goal: Ability to maintain clinical measurements within normal limits will improve ?Outcome: Adequate for Discharge ?Goal: Will remain free from infection ?Outcome: Adequate for Discharge ?Goal: Diagnostic test results will improve ?Outcome: Adequate for Discharge ?Goal: Respiratory complications will improve ?Outcome: Adequate for Discharge ?Goal: Cardiovascular complication will be avoided ?Outcome: Adequate for Discharge ?  ?Problem: Activity: ?Goal: Risk for activity intolerance will decrease ?Outcome: Adequate for Discharge ?  ?Problem: Nutrition: ?Goal: Adequate nutrition will be maintained ?Outcome: Adequate for Discharge ?  ?Problem: Coping: ?Goal: Level of anxiety will decrease ?Outcome: Adequate for Discharge ?  ?Problem: Elimination: ?Goal: Will not experience complications related to bowel motility ?Outcome: Adequate for Discharge ?Goal: Will not experience complications related to urinary retention ?Outcome: Adequate for Discharge ?  ?Problem: Pain Managment: ?Goal: General experience of comfort will improve ?Outcome: Adequate for Discharge ?  ?Problem: Safety: ?Goal: Ability to remain free from injury will improve ?Outcome: Adequate for Discharge ?  ?Problem: Skin Integrity: ?Goal: Risk for impaired skin integrity will decrease ?Outcome: Adequate for Discharge ?  ?Problem: Education: ?Goal: Knowledge of General Education information will improve ?Description: Including pain rating scale, medication(s)/side effects and non-pharmacologic comfort measures ?Outcome: Adequate for Discharge ?  ?Problem: Health  Behavior/Discharge Planning: ?Goal: Ability to manage health-related needs will improve ?Outcome: Adequate for Discharge ?  ?Problem: Clinical Measurements: ?Goal: Ability to maintain clinical measurements within normal limits will improve ?Outcome: Adequate for Discharge ?Goal: Will remain free from infection ?Outcome: Adequate for Discharge ?Goal: Diagnostic test results will improve ?Outcome: Adequate for Discharge ?Goal: Respiratory complications will improve ?Outcome: Adequate for Discharge ?Goal: Cardiovascular complication will be avoided ?Outcome: Adequate for Discharge ?  ?Problem: Activity: ?Goal: Risk for activity intolerance will decrease ?Outcome: Adequate for Discharge ?  ?Problem: Nutrition: ?Goal: Adequate nutrition will be maintained ?Outcome: Adequate for Discharge ?  ?Problem: Coping: ?Goal: Level of anxiety will decrease ?Outcome: Adequate for Discharge ?  ?Problem: Elimination: ?Goal: Will not experience complications related to bowel motility ?Outcome: Adequate for Discharge ?Goal: Will not experience complications related to urinary retention ?Outcome: Adequate for Discharge ?  ?Problem: Pain Managment: ?Goal: General experience of comfort will improve ?Outcome: Adequate for Discharge ?  ?Problem: Safety: ?Goal: Ability to remain free from injury will improve ?Outcome: Adequate for Discharge ?  ?Problem: Skin Integrity: ?Goal: Risk for impaired skin integrity will decrease ?Outcome: Adequate for Discharge ?  ?Problem: Education: ?Goal: Knowledge of General Education information will improve ?Description: Including pain rating scale, medication(s)/side effects and non-pharmacologic comfort measures ?Outcome: Adequate for Discharge ?  ?Problem: Health Behavior/Discharge Planning: ?Goal: Ability to manage health-related needs will improve ?Outcome: Adequate for Discharge ?  ?Problem: Clinical Measurements: ?Goal: Ability to maintain clinical measurements within normal limits will  improve ?Outcome: Adequate for Discharge ?Goal: Will remain free from infection ?Outcome: Adequate for Discharge ?Goal: Diagnostic test results will improve ?Outcome: Adequate for Discharge ?Goal: Respiratory complications will improve ?Outcome: Adequate for Discharge ?Goal: Cardiovascular complication will be avoided ?Outcome: Adequate for Discharge ?  ?Problem: Activity: ?Goal: Risk for activity intolerance will decrease ?Outcome: Adequate for Discharge ?  ?Problem: Nutrition: ?Goal: Adequate nutrition will be maintained ?Outcome: Adequate for Discharge ?  ?Problem: Coping: ?Goal: Level of anxiety will decrease ?Outcome: Adequate for  Discharge ?  ?Problem: Elimination: ?Goal: Will not experience complications related to bowel motility ?Outcome: Adequate for Discharge ?Goal: Will not experience complications related to urinary retention ?Outcome: Adequate for Discharge ?  ?Problem: Pain Managment: ?Goal: General experience of comfort will improve ?Outcome: Adequate for Discharge ?  ?Problem: Safety: ?Goal: Ability to remain free from injury will improve ?Outcome: Adequate for Discharge ?  ?Problem: Skin Integrity: ?Goal: Risk for impaired skin integrity will decrease ?Outcome: Adequate for Discharge ?  ?

## 2021-06-02 ENCOUNTER — Telehealth: Payer: Self-pay

## 2021-06-02 LAB — TYPE AND SCREEN
ABO/RH(D): O POS
Antibody Screen: NEGATIVE
Unit division: 0
Unit division: 0

## 2021-06-02 LAB — BPAM RBC
Blood Product Expiration Date: 202305122359
Blood Product Expiration Date: 202305132359
ISSUE DATE / TIME: 202304082055
ISSUE DATE / TIME: 202304090022
Unit Type and Rh: 5100
Unit Type and Rh: 5100

## 2021-06-02 NOTE — Telephone Encounter (Signed)
Transition Care Management Follow-up Telephone Call ?Date of discharge and from where: 06/01/2021, [facility]  ?How have you been since you were released from the hospital? She said she is grateful to be home; but still tired.  ?Any questions or concerns? No - just wanted to see a PCP as soon as possible.  ? ?Items Reviewed: ?Did the pt receive and understand the discharge instructions provided? Yes  ?Medications obtained and verified?  She is picking up the iron today  ?Other? No  ?Any new allergies since your discharge? No  ?Dietary orders reviewed? No ?Do you have support at home? Yes  -she said she as a great support system.  ? ?Home Care and Equipment/Supplies: ?Were home health services ordered? no ?If so, what is the name of the agency? N/a  ?Has the agency set up a time to come to the patient's home? not applicable ?Were any new equipment or medical supplies ordered?  No ?What is the name of the medical supply agency? N/a ?Were you able to get the supplies/equipment? not applicable ?Do you have any questions related to the use of the equipment or supplies? No ? ?Functional Questionnaire: (I = Independent and D = Dependent) ?ADLs: independent ? ?Follow up appointments reviewed: ? ?PCP Hospital f/u appt confirmed? Yes  Scheduled to see Dr Redmond Pulling- 06/04/2021. Will need to establish care ?[facility] f/u appt confirmed?  None scheduled at this time    ?Are transportation arrangements needed? No  ?If their condition worsens, is the pt aware to call PCP or go to the Emergency Dept.? Yes ?Was the patient provided with contact information for the PCP's office or ED? Yes ?Was to pt encouraged to call back with questions or concerns? Yes ? ?

## 2021-06-04 ENCOUNTER — Encounter: Payer: Self-pay | Admitting: Family Medicine

## 2021-06-04 ENCOUNTER — Ambulatory Visit: Payer: BC Managed Care – PPO | Admitting: Family Medicine

## 2021-06-04 VITALS — BP 110/78 | HR 80 | Temp 98.0°F | Resp 16 | Ht 59.75 in | Wt 187.2 lb

## 2021-06-04 DIAGNOSIS — Z7689 Persons encountering health services in other specified circumstances: Secondary | ICD-10-CM | POA: Diagnosis not present

## 2021-06-04 DIAGNOSIS — N920 Excessive and frequent menstruation with regular cycle: Secondary | ICD-10-CM | POA: Diagnosis not present

## 2021-06-04 DIAGNOSIS — F341 Dysthymic disorder: Secondary | ICD-10-CM | POA: Diagnosis not present

## 2021-06-04 DIAGNOSIS — D500 Iron deficiency anemia secondary to blood loss (chronic): Secondary | ICD-10-CM

## 2021-06-04 MED ORDER — BUPROPION HCL ER (XL) 150 MG PO TB24: 150.0000 mg | ORAL_TABLET | Freq: Every day | ORAL | 0 refills | Status: DC

## 2021-06-04 NOTE — Progress Notes (Signed)
Patient is here to establish care.

## 2021-06-05 ENCOUNTER — Encounter: Payer: Self-pay | Admitting: Family Medicine

## 2021-06-05 NOTE — Progress Notes (Signed)
? ?New Patient Office Visit ? ?Subjective:  ?Patient ID: Aimee King, female    DOB: October 17, 1980  Age: 41 y.o. MRN: [id number] ? ?CC: No chief complaint on file. ? ? ?HPI ?Aimee King presents for to establish care. Patient was recently seen in the hospital after having chest pain sx and was noted to have a normal CV workup but was extremely anemic. She had been having heavy menses. Patient reports that she has felt better since discharge. She also reports that she had been on wellbutrin in the past but has been out 2/2 provider change.  ? ?Past Medical History:  ?Diagnosis Date  ? Acid reflux   ? ADD (attention deficit disorder)   ? Allergy   ? Anemia   ? Attention deficit disorder (ADD)   ? Depression   ? Endometriosis   ? IBS (irritable bowel syndrome)   ? ? ?Past Surgical History:  ?Procedure Laterality Date  ? CESAREAN SECTION  2004, 2008  ? CESAREAN SECTION    ? CHOLECYSTECTOMY  2004  ? CHOLECYSTECTOMY    ? LAPAROSCOPY  2002  ? endometriosis  ? TUBAL LIGATION  2008  ? TUBAL LIGATION    ? ? ?Family History  ?Problem Relation Age of Onset  ? Hypertension Mother   ? Diabetes Mother   ? Hypertension Father   ? Heart disease Father   ? Diabetes Father   ? Diabetes Maternal Grandmother   ? Kidney disease Maternal Grandfather   ? Kidney disease Maternal Uncle   ? Colon cancer Neg Hx   ? Esophageal cancer Neg Hx   ? Rectal cancer Neg Hx   ? Stomach cancer Neg Hx   ? ? ?Social History  ? ?Socioeconomic History  ? Marital status: Married  ?  Spouse name: Not on file  ? Number of children: 3  ? Years of education: Not on file  ? Highest education level: Not on file  ?Occupational History  ? Occupation: Tourist information centre manager   ?Tobacco Use  ? Smoking status: Never  ? Smokeless tobacco: Never  ?Vaping Use  ? Vaping Use: Never used  ?Substance and Sexual Activity  ? Alcohol use: Yes  ?  Alcohol/week: 0.0 standard drinks  ?  Comment: ocassionally  ? Drug use: No  ? Sexual activity: Yes  ?  Partners: Male  ?Other  Topics Concern  ? Not on file  ?Social History Narrative  ? ** Merged History Encounter **  ?    ? ?Social Determinants of Health  ? ?Financial Resource Strain: Not on file  ?Food Insecurity: Not on file  ?Transportation Needs: Not on file  ?Physical Activity: Not on file  ?Stress: Not on file  ?Social Connections: Not on file  ?Intimate Partner Violence: Not on file  ? ? ?ROS ?Review of Systems  ?Constitutional:  Positive for fatigue.  ?Genitourinary:  Positive for menstrual problem.  ?Psychiatric/Behavioral:  Positive for decreased concentration and sleep disturbance. Negative for self-injury and suicidal ideas. The patient is not nervous/anxious.   ?All other systems reviewed and are negative. ? ?Objective:  ? ?Today's Vitals: BP 110/78   Pulse 80   Temp 98 ?F (36.7 ?C) (Oral)   Resp 16   Ht 4' 11.75" (1.518 m)   Wt 187 lb 3.2 oz (84.9 kg)   SpO2 97%   BMI 36.87 kg/m?  ? ?Physical Exam ?Vitals and nursing note reviewed.  ?Constitutional:   ?   General: She is not in acute distress. ?Cardiovascular:  ?  Rate and Rhythm: Normal rate and regular rhythm.  ?Pulmonary:  ?   Effort: Pulmonary effort is normal.  ?   Breath sounds: Normal breath sounds.  ?Abdominal:  ?   Palpations: Abdomen is soft.  ?   Tenderness: There is no abdominal tenderness.  ?Neurological:  ?   General: No focal deficit present.  ?   Mental Status: She is alert and oriented to person, place, and time.  ?Psychiatric:     ?   Mood and Affect: Mood and affect normal.     ?   Behavior: Behavior normal. Behavior is cooperative.  ? ? ?Assessment & Plan:  ? ?1. Menorrhagia with regular cycle ?Labs ordered. Referral to gyn for further eval/mgt.  ?- CBC with Differential ?- Ambulatory referral to Obstetrics / Gynecology ? ?2. Iron deficiency anemia due to chronic blood loss ?Continue supplements. Referral and labs as above ? ?3. Dysthymia ?Wellbutrin refilled. monitor ? ?4. Encounter to establish care ? ? ? ? ?Outpatient Encounter Medications as  of 06/04/2021  ?Medication Sig  ? acetaminophen (TYLENOL) 500 MG tablet Take 500 mg by mouth every 6 (six) hours as needed for moderate pain.  ? albuterol (VENTOLIN HFA) 108 (90 Base) MCG/ACT inhaler Inhale into the lungs every 6 (six) hours as needed for wheezing or shortness of breath.  ? Amphetamine-Dextroamphetamine (ADDERALL XR PO) Take by mouth.  ? buPROPion (WELLBUTRIN XL) 150 MG 24 hr tablet Take 1 tablet (150 mg total) by mouth daily.  ? ferrous sulfate 325 (65 FE) MG tablet Take 1 tablet (325 mg total) by mouth 3 (three) times daily with meals. Ok to take once per day or once every other day if constipation develops  ? fexofenadine (ALLEGRA) 180 MG tablet Take 180 mg by mouth daily.  ? [DISCONTINUED] buPROPion (WELLBUTRIN XL) 150 MG 24 hr tablet Take 150 mg by mouth daily.  ? ?No facility-administered encounter medications on file as of 06/04/2021.  ? ? ?Follow-up: No follow-ups on file.  ? ?Becky Sax, MD ? ?

## 2021-06-12 ENCOUNTER — Ambulatory Visit: Payer: BC Managed Care – PPO | Admitting: Family Medicine

## 2021-06-23 ENCOUNTER — Encounter: Payer: Self-pay | Admitting: Family Medicine

## 2021-06-23 ENCOUNTER — Ambulatory Visit: Payer: BC Managed Care – PPO | Admitting: Family Medicine

## 2021-06-23 VITALS — BP 109/79 | HR 92 | Temp 98.3°F | Resp 16 | Wt 185.6 lb

## 2021-06-23 DIAGNOSIS — F902 Attention-deficit hyperactivity disorder, combined type: Secondary | ICD-10-CM | POA: Diagnosis not present

## 2021-06-23 DIAGNOSIS — J0101 Acute recurrent maxillary sinusitis: Secondary | ICD-10-CM

## 2021-06-23 MED ORDER — AMOXICILLIN 875 MG PO TABS: 875.0000 mg | ORAL_TABLET | Freq: Two times a day (BID) | ORAL | 0 refills | Status: AC

## 2021-06-23 MED ORDER — AMPHETAMINE-DEXTROAMPHET ER 25 MG PO CP24
25.0000 mg | ORAL_CAPSULE | ORAL | 0 refills | Status: DC
Start: 2021-06-23 — End: 2021-07-22

## 2021-06-23 MED ORDER — AMPHETAMINE-DEXTROAMPHETAMINE 5 MG PO TABS: 5.0000 mg | ORAL_TABLET | Freq: Every day | ORAL | 0 refills | Status: DC

## 2021-06-23 NOTE — Progress Notes (Signed)
? ?Established Patient Office Visit ? ?Subjective   ? ?Patient ID: Aimee King, female    DOB: Feb 25, 1980  Age: 41 y.o. MRN: [id number] ? ?CC:  ?Chief Complaint  ?Patient presents with  ? Follow-up  ? ? ?HPI ?Aimee King presents with complaint of previously dx ADHD for which she has had no meds 2/2 provider change. She reports having difficulty at work. Also with complaint of purulent nasal drainage and headache. She denies fever/chills or viral sx. She has a history of intermittent sinusitis.  ? ? ?Outpatient Encounter Medications as of 06/23/2021  ?Medication Sig  ? acetaminophen (TYLENOL) 500 MG tablet Take 500 mg by mouth every 6 (six) hours as needed for moderate pain.  ? albuterol (VENTOLIN HFA) 108 (90 Base) MCG/ACT inhaler Inhale into the lungs every 6 (six) hours as needed for wheezing or shortness of breath.  ? amoxicillin (AMOXIL) 875 MG tablet Take 1 tablet (875 mg total) by mouth 2 (two) times daily for 10 days.  ? amphetamine-dextroamphetamine (ADDERALL XR) 25 MG 24 hr capsule Take 1 capsule by mouth every morning.  ? amphetamine-dextroamphetamine (ADDERALL) 5 MG tablet Take 1 tablet (5 mg total) by mouth daily.  ? buPROPion (WELLBUTRIN XL) 150 MG 24 hr tablet Take 1 tablet (150 mg total) by mouth daily.  ? ferrous sulfate 325 (65 FE) MG tablet Take 1 tablet (325 mg total) by mouth 3 (three) times daily with meals. Ok to take once per day or once every other day if constipation develops  ? fexofenadine (ALLEGRA) 180 MG tablet Take 180 mg by mouth daily.  ? [DISCONTINUED] Amphetamine-Dextroamphetamine (ADDERALL XR PO) Take by mouth.  ? ?No facility-administered encounter medications on file as of 06/23/2021.  ? ? ?Past Medical History:  ?Diagnosis Date  ? Acid reflux   ? ADD (attention deficit disorder)   ? Allergy   ? Anemia   ? Attention deficit disorder (ADD)   ? Depression   ? Endometriosis   ? IBS (irritable bowel syndrome)   ? ? ?Past Surgical History:  ?Procedure Laterality  Date  ? CESAREAN SECTION  2004, 2008  ? CESAREAN SECTION    ? CHOLECYSTECTOMY  2004  ? CHOLECYSTECTOMY    ? LAPAROSCOPY  2002  ? endometriosis  ? TUBAL LIGATION  2008  ? TUBAL LIGATION    ? ? ?Family History  ?Problem Relation Age of Onset  ? Hypertension Mother   ? Diabetes Mother   ? Hypertension Father   ? Heart disease Father   ? Diabetes Father   ? Diabetes Maternal Grandmother   ? Kidney disease Maternal Grandfather   ? Kidney disease Maternal Uncle   ? Colon cancer Neg Hx   ? Esophageal cancer Neg Hx   ? Rectal cancer Neg Hx   ? Stomach cancer Neg Hx   ? ? ?Social History  ? ?Socioeconomic History  ? Marital status: Married  ?  Spouse name: Not on file  ? Number of children: 3  ? Years of education: Not on file  ? Highest education level: Not on file  ?Occupational History  ? Occupation: Tourist information centre manager   ?Tobacco Use  ? Smoking status: Never  ? Smokeless tobacco: Never  ?Vaping Use  ? Vaping Use: Never used  ?Substance and Sexual Activity  ? Alcohol use: Yes  ?  Alcohol/week: 0.0 standard drinks  ?  Comment: ocassionally  ? Drug use: No  ? Sexual activity: Yes  ?  Partners: Male  ?Other Topics Concern  ?  Not on file  ?Social History Narrative  ? ** Merged History Encounter **  ?    ? ?Social Determinants of Health  ? ?Financial Resource Strain: Not on file  ?Food Insecurity: Not on file  ?Transportation Needs: Not on file  ?Physical Activity: Not on file  ?Stress: Not on file  ?Social Connections: Not on file  ?Intimate Partner Violence: Not on file  ? ? ?Review of Systems  ?Constitutional:  Negative for chills, fever and malaise/fatigue.  ?HENT:  Positive for sinus pain.   ?Neurological:  Positive for headaches.  ?All other systems reviewed and are negative. ? ?  ? ? ?Objective   ? ?BP 109/79   Pulse 92   Temp 98.3 ?F (36.8 ?C) (Oral)   Resp 16   Wt 185 lb 9.6 oz (84.2 kg)   SpO2 98%   BMI 36.55 kg/m?  ? ?Physical Exam ?Vitals and nursing note reviewed.  ?Constitutional:   ?   General: She is not in acute  distress. ?   Appearance: She is obese.  ?HENT:  ?   Head: Normocephalic and atraumatic.  ?   Nose:  ?   Right Sinus: Maxillary sinus tenderness present.  ?   Left Sinus: Maxillary sinus tenderness present.  ?   Mouth/Throat:  ?   Mouth: Mucous membranes are moist.  ?   Pharynx: Oropharynx is clear.  ?Cardiovascular:  ?   Rate and Rhythm: Normal rate and regular rhythm.  ?Pulmonary:  ?   Effort: Pulmonary effort is normal.  ?   Breath sounds: Normal breath sounds.  ?Abdominal:  ?   Palpations: Abdomen is soft.  ?   Tenderness: There is no abdominal tenderness.  ?Neurological:  ?   General: No focal deficit present.  ?   Mental Status: She is alert and oriented to person, place, and time.  ?Psychiatric:     ?   Mood and Affect: Mood and affect normal.     ?   Speech: Speech normal.     ?   Behavior: Behavior normal. Behavior is cooperative.  ? ? ? ?  ? ?Assessment & Plan:  ? ?1. Attention deficit hyperactivity disorder (ADHD), combined type ?Meds utilized extrapolated from previous med records. Refilled. Will monitor ? ?2. Acute recurrent maxillary sinusitis ?Amoxicillin prescribed ? ? ? ?Return in about 4 weeks (around 07/21/2021) for follow up.  ? ?Becky Sax, MD ? ? ?

## 2021-06-23 NOTE — Progress Notes (Signed)
Patient is here for medication. Patient said that she needs her adderall. ? ?Patient is c/o possible sinus infection. Patient has taken a COVID test and it's was  negative. Patient has dark yellowish mucus and headache. ? ?Patient said that she may also have a shingle outbreak due to possible out break ? ?

## 2021-07-07 ENCOUNTER — Encounter (HOSPITAL_COMMUNITY): Payer: Self-pay

## 2021-07-07 ENCOUNTER — Other Ambulatory Visit: Payer: Self-pay

## 2021-07-07 ENCOUNTER — Emergency Department (HOSPITAL_COMMUNITY)
Admission: EM | Admit: 2021-07-07 | Discharge: 2021-07-07 | Disposition: A | Discharge status: Home / Self Care | Payer: BC Managed Care – PPO | Attending: Emergency Medicine | Admitting: Emergency Medicine

## 2021-07-07 ENCOUNTER — Ambulatory Visit: Payer: Self-pay | Admitting: *Deleted

## 2021-07-07 DIAGNOSIS — N939 Abnormal uterine and vaginal bleeding, unspecified: Secondary | ICD-10-CM

## 2021-07-07 DIAGNOSIS — Z91040 Latex allergy status: Secondary | ICD-10-CM | POA: Insufficient documentation

## 2021-07-07 LAB — BASIC METABOLIC PANEL
Anion gap: 6 (ref 5–15)
BUN: 9 mg/dL (ref 6–20)
CO2: 26 mmol/L (ref 22–32)
Calcium: 8.8 mg/dL — ABNORMAL LOW (ref 8.9–10.3)
Chloride: 109 mmol/L (ref 98–111)
Creatinine, Ser: 0.66 mg/dL (ref 0.44–1.00)
GFR, Estimated: >60 mL/min (ref >60)
Glucose, Bld: 113 mg/dL — ABNORMAL HIGH (ref 70–99)
Potassium: 3.9 mmol/L (ref 3.5–5.1)
Sodium: 141 mmol/L (ref 135–145)

## 2021-07-07 LAB — CBC
HCT: 30.6 % — ABNORMAL LOW (ref 36.0–46.0)
Hemoglobin: 9.3 g/dL — ABNORMAL LOW (ref 12.0–15.0)
MCH: 27 pg (ref 26.0–34.0)
MCHC: 30.4 g/dL (ref 30.0–36.0)
MCV: 89 fL (ref 80.0–100.0)
Platelets: 256 10*3/uL (ref 150–400)
RBC: 3.44 MIL/uL — ABNORMAL LOW (ref 3.87–5.11)
RDW: 26.1 % — ABNORMAL HIGH (ref 11.5–15.5)
WBC: 9.5 10*3/uL (ref 4.0–10.5)
nRBC: 0 % (ref 0.0–0.2)

## 2021-07-07 LAB — I-STAT BETA HCG BLOOD, ED (MC, WL, AP ONLY): I-stat hCG, quantitative: <5 m[IU]/mL (ref <5)

## 2021-07-07 MED ORDER — MEGESTROL ACETATE 40 MG PO TABS: 40.0000 mg | ORAL_TABLET | Freq: Every day | ORAL | 0 refills | Status: DC

## 2021-07-07 NOTE — Telephone Encounter (Signed)
?  Chief Complaint: Vaginal Bleeding ?Symptoms: Severe vaginal bleeding, large clots consistently "Quarter size", lightheaded ?Frequency: 24 days ?Pertinent Negatives: Patient denies  ?Disposition: '[x]'$ ED /'[]'$ Urgent Care (no appt availability in office) / '[]'$ Appointment(In office/virtual)/ '[]'$  [facility]/ '[]'$ Home Care/ '[]'$ Refused Recommended Disposition /'[]'$ [facility] [facility]/ '[]'$  Follow-up with PCP ?Additional Notes: Directed to ED, states will follow disposition. ?Reason for Disposition ? SEVERE vaginal bleeding (e.g., soaking 2 pads or tampons per hour and present 2 or more hours; 1 menstrual cup every 2 hours) ? ?Answer Assessment - Initial Assessment Questions ?1. AMOUNT: "Describe the bleeding that you are having."  ?  - SPOTTING: spotting, or pinkish / brownish mucous discharge; does not fill panty liner or pad  ?  - MILD:  less than 1 pad / hour; less than patient's usual menstrual bleeding ?  - MODERATE: 1-2 pads / hour; 1 menstrual cup every 6 hours; small-medium blood clots (e.g., pea, grape, small coin) ?  - SEVERE: soaking 2 or more pads/hour for 2 or more hours; 1 menstrual cup every 2 hours; bleeding not contained by pads or continuous red blood from vagina; large blood clots (e.g., golf ball, large coin)  ?    Severe, clots ?2. ONSET: "When did the bleeding begin?" "Is it continuing now?" ?    24 days ago ?3. MENSTRUAL PERIOD: "When was the last normal menstrual period?" "How is this different than your period?" ?    February ?4. REGULARITY: "How regular are your periods?" ?    They were ?5. ABDOMINAL PAIN: "Do you have any pain?" "How bad is the pain?"  (e.g., Scale 1-10; mild, moderate, or severe) ?  - MILD (1-3): doesn't interfere with normal activities, abdomen soft and not tender to touch  ?  - MODERATE (4-7): interferes with normal activities or awakens from sleep, abdomen tender to touch  ?  - SEVERE (8-10): excruciating pain, doubled over, unable to do any normal activities  ?     Intermittent, cramping at times ?6. PREGNANCY: "Could you be pregnant?" "Are you sexually active?" "Did you recently give birth?" ?    No ?7. BREASTFEEDING: "Are you breastfeeding?" ?    No ?8. HORMONES: "Are you taking any hormone medications, prescription or OTC?" (e.g., birth control pills, estrogen) ?    No ?9. BLOOD THINNERS: "Do you take any blood thinners?" (e.g., Coumadin/warfarin, Pradaxa/dabigatran, aspirin) ?    no ?10. CAUSE: "What do you think is causing the bleeding?" (e.g., recent gyn surgery, recent gyn procedure; known bleeding disorder, cervical cancer, polycystic ovarian disease, fibroids)   ?       ?11. HEMODYNAMIC STATUS: "Are you weak or feeling lightheaded?" If Yes, ask: "Can you stand and walk normally?"  ?      Lightheaded, on Iron ?12. OTHER SYMPTOMS: "What other symptoms are you having with the bleeding?" (e.g., passed tissue, vaginal discharge, fever, menstrual-type cramps) ?     Quarter sized clots ? ?Protocols used: Vaginal Bleeding - Abnormal-A-AH ? ?

## 2021-07-07 NOTE — Discharge Instructions (Addendum)
Please take Megace as prescribed. ?Call your gynecologist today for recheck within the next 2 weeks ?You should have your hemoglobin rechecked at that time ?Today your hemoglobin is 9.3 ?Return if you begin having worsening lightheadedness, chest pain, shortness of ?

## 2021-07-07 NOTE — ED Provider Notes (Signed)
?[facility] DEPT ?Provider Note ? ? ?CSN: [id number] ?Arrival date & time: 07/07/21  1048 ? ?  ? ?History ? ?Chief Complaint  ?Patient presents with  ? Vaginal Bleeding  ? ? ?Aimee King is a 41 y.o. female. ? ?HPI ?41 year old female presents today complaining of vaginal bleeding.  She states that she has had very heavy periods in the past.  She required a blood transfusion last month.  She states that for the past 24 days she has had heavy vaginal bleeding.  She has never had a period that just continued like this.  She feels slightly weak and somewhat lightheaded but is not any syncope or significant chest pain. ? ?  ? ?Home Medications ?Prior to Admission medications   ?Medication Sig Start Date End Date Taking? Authorizing Provider  ?megestrol (MEGACE) 40 MG tablet Take 1 tablet (40 mg total) by mouth daily. 07/07/21  Yes Pattricia Boss, MD  ?acetaminophen (TYLENOL) 500 MG tablet Take 500 mg by mouth every 6 (six) hours as needed for moderate pain.    [provider]  ?albuterol (VENTOLIN HFA) 108 (90 Base) MCG/ACT inhaler Inhale into the lungs every 6 (six) hours as needed for wheezing or shortness of breath.    [provider]  ?amphetamine-dextroamphetamine (ADDERALL XR) 25 MG 24 hr capsule Take 1 capsule by mouth every morning. 06/23/21   Dorna Mai, MD  ?amphetamine-dextroamphetamine (ADDERALL) 5 MG tablet Take 1 tablet (5 mg total) by mouth daily. 06/23/21   Dorna Mai, MD  ?buPROPion (WELLBUTRIN XL) 150 MG 24 hr tablet Take 1 tablet (150 mg total) by mouth daily. 06/04/21   Dorna Mai, MD  ?ferrous sulfate 325 (65 FE) MG tablet Take 1 tablet (325 mg total) by mouth 3 (three) times daily with meals. Ok to take once per day or once every other day if constipation develops 06/01/21   Emeterio Reeve, DO  ?fexofenadine (ALLEGRA) 180 MG tablet Take 180 mg by mouth daily.    [provider]  ?   ? ?Allergies    ?Latex, Codeine, Codeine,  and Latex   ? ?Review of Systems   ?Review of Systems  ?All other systems reviewed and are negative. ? ?Physical Exam ?Updated Vital Signs ?BP 104/76   Pulse 88   Temp 98.1 ?F (36.7 ?C) (Oral)   Resp 15   Ht 1.518 m (4' 11.75")   Wt 83.9 kg   LMP 07/07/2021   SpO2 100%   BMI 36.43 kg/m?  ?Physical Exam ?Vitals and nursing note reviewed.  ?HENT:  ?   Head: Normocephalic.  ?   Right Ear: External ear normal.  ?   Left Ear: External ear normal.  ?   Nose: Nose normal.  ?   Mouth/Throat:  ?   Pharynx: Oropharynx is clear.  ?Eyes:  ?   Pupils: Pupils are equal, round, and reactive to light.  ?Cardiovascular:  ?   Rate and Rhythm: Normal rate and regular rhythm.  ?Pulmonary:  ?   Effort: Pulmonary effort is normal.  ?   Breath sounds: Normal breath sounds.  ?Abdominal:  ?   General: Abdomen is flat.  ?   Palpations: Abdomen is soft.  ?Genitourinary: ?   Cervix: Cervical bleeding present.  ?   Uterus: Enlarged.   ?Musculoskeletal:     ?   General: Normal range of motion.  ?   Cervical back: Normal range of motion.  ?Skin: ?   General: Skin is warm and  dry.  ?Neurological:  ?   Mental Status: She is alert.  ? ? ?ED Results / Procedures / Treatments   ?Labs ?(all labs ordered are listed, but only abnormal results are displayed) ?Labs Reviewed  ?CBC - Abnormal; Notable for the following components:  ?    Result Value  ? RBC 3.44 (*)   ? Hemoglobin 9.3 (*)   ? HCT 30.6 (*)   ? RDW 26.1 (*)   ? All other components within normal limits  ?BASIC METABOLIC PANEL - Abnormal; Notable for the following components:  ? Glucose, Bld 113 (*)   ? Calcium 8.8 (*)   ? All other components within normal limits  ?I-STAT BETA HCG BLOOD, ED ([facility], [facility], [facility] ONLY)  ? ? ?EKG ?None ? ?Radiology ?No results found. ? ?Procedures ?Procedures  ? ? ?Medications Ordered in ED ?Medications - No data to display ? ?ED Course/ Medical Decision Making/ A&P ?  ?                        ?Medical Decision Making ?41 year old female with vaginal bleeding  today.  Differential diagnosis includes menorrhagia from fibroids,  pregnancy, infection, other sources of vaginal bleeding such as injury to the perineum or vagina, ?Patient has history of menorrhagia but is having much longer course of bleeding this episode.  Her hemoglobin is stable here at 9.  Patient is hemodynamically stable. ?She will be started on Megace and is advised regarding need for follow-up. ? ?Amount and/or Complexity of Data Reviewed ?Labs: ordered. ? ? ? ? ? ? ? ? ? ? ?Final Clinical Impression(s) / ED Diagnoses ?Final diagnoses:  ?Abnormal uterine bleeding (AUB)  ? ? ?Rx / DC Orders ?ED Discharge Orders   ? ?      Ordered  ?  megestrol (MEGACE) 40 MG tablet  Daily       ? 07/07/21 1238  ? ?  ?  ? ?  ? ? ?  ?Pattricia Boss, MD ?07/07/21 1240 ? ?

## 2021-07-07 NOTE — ED Triage Notes (Signed)
Patient c/o heavy vaginal bleeding with clots x 24 days. ?

## 2021-07-15 ENCOUNTER — Encounter: Payer: Self-pay | Admitting: Obstetrics

## 2021-07-15 ENCOUNTER — Ambulatory Visit: Payer: BC Managed Care – PPO | Admitting: Obstetrics

## 2021-07-15 ENCOUNTER — Other Ambulatory Visit (HOSPITAL_COMMUNITY)
Admission: RE | Admit: 2021-07-15 | Discharge: 2021-07-15 | Disposition: A | Discharge status: Home / Self Care | Payer: BC Managed Care – PPO | Source: Ambulatory Visit | Attending: Obstetrics | Admitting: Obstetrics

## 2021-07-15 VITALS — BP 120/84 | HR 111 | Ht 60.0 in | Wt 185.0 lb

## 2021-07-15 DIAGNOSIS — N839 Noninflammatory disorder of ovary, fallopian tube and broad ligament, unspecified: Secondary | ICD-10-CM | POA: Diagnosis not present

## 2021-07-15 DIAGNOSIS — N939 Abnormal uterine and vaginal bleeding, unspecified: Secondary | ICD-10-CM | POA: Diagnosis not present

## 2021-07-15 DIAGNOSIS — Z9889 Other specified postprocedural states: Secondary | ICD-10-CM | POA: Diagnosis not present

## 2021-07-15 DIAGNOSIS — Z01419 Encounter for gynecological examination (general) (routine) without abnormal findings: Secondary | ICD-10-CM | POA: Diagnosis present

## 2021-07-15 NOTE — Progress Notes (Signed)
Pt had tubal reversal in 2021, would like to conceive.  Pt last cycle was 24 days.   Pt states cycles have been longer in duration since December. Pt states she is also having large clots.   Pt states she was given Megace- bleeding stopped after a couple days.

## 2021-07-15 NOTE — Progress Notes (Signed)
Subjective:        Aimee King is a 41 y.o. female here for a routine exam.  Current complaints: Irregular, heavy vaginal bleeding.  Evaluated in the ED and Rx Megace over the past week, and bleeding has ceased.  Past surgical history is significant for a tubal reversal in 2021, and she has been trying to conceive since then.  Personal health questionnaire:  Is patient Ashkenazi Jewish, have a family history of breast and/or ovarian cancer: no Is there a family history of uterine cancer diagnosed at age < 66, gastrointestinal cancer, urinary tract cancer, family member who is a Field seismologist syndrome-associated carrier: no Is the patient overweight and hypertensive, family history of diabetes, personal history of gestational diabetes, preeclampsia or PCOS: yes Is patient over 65, have PCOS,  family history of premature CHD under age 22, diabetes, smoke, have hypertension or peripheral artery disease:  no At any time, has a partner hit, kicked or otherwise hurt or frightened you?: no Over the past 2 weeks, have you felt down, depressed or hopeless?: no Over the past 2 weeks, have you felt little interest or pleasure in doing things?:no   Gynecologic History Patient's last menstrual period was 07/07/2021. Contraception: none Last Pap: 2019. Results were: normal Last mammogram: n/a. Results were: n/a  Obstetric History OB History  No obstetric history on file.    Past Medical History:  Diagnosis Date   Acid reflux    ADD (attention deficit disorder)    Allergy    Anemia    Attention deficit disorder (ADD)    Depression    Endometriosis    IBS (irritable bowel syndrome)     Past Surgical History:  Procedure Laterality Date   CESAREAN SECTION  2004, 2008   CESAREAN SECTION     CHOLECYSTECTOMY  02/23/2002   CHOLECYSTECTOMY     LAPAROSCOPY  02/24/2000   endometriosis   TUBAL LIGATION  02/23/2006   TUBAL LIGATION     tubal ligation reversal Right      Current  Outpatient Medications:    albuterol (VENTOLIN HFA) 108 (90 Base) MCG/ACT inhaler, Inhale into the lungs every 6 (six) hours as needed for wheezing or shortness of breath., Disp: , Rfl:    amphetamine-dextroamphetamine (ADDERALL XR) 25 MG 24 hr capsule, Take 1 capsule by mouth every morning., Disp: 30 capsule, Rfl: 0   amphetamine-dextroamphetamine (ADDERALL) 5 MG tablet, Take 1 tablet (5 mg total) by mouth daily., Disp: 30 tablet, Rfl: 0   buPROPion (WELLBUTRIN XL) 150 MG 24 hr tablet, Take 1 tablet (150 mg total) by mouth daily., Disp: 90 tablet, Rfl: 0   ferrous sulfate 325 (65 FE) MG tablet, Take 1 tablet (325 mg total) by mouth 3 (three) times daily with meals. Ok to take once per day or once every other day if constipation develops, Disp: 90 tablet, Rfl: 0   fexofenadine (ALLEGRA) 180 MG tablet, Take 180 mg by mouth daily., Disp: , Rfl:    megestrol (MEGACE) 40 MG tablet, Take 1 tablet (40 mg total) by mouth daily. (Patient not taking: Reported on 07/15/2021), Disp: 14 tablet, Rfl: 0 Allergies  Allergen Reactions   Latex Swelling   Codeine Nausea And Vomiting   Codeine Nausea And Vomiting   Latex     Feels like little cuts in her vagina.    Social History   Tobacco Use   Smoking status: Never   Smokeless tobacco: Never  Substance Use Topics   Alcohol use: Yes  Alcohol/week: 0.0 standard drinks    Comment: ocassionally    Family History  Problem Relation Age of Onset   Hypertension Mother    Diabetes Mother    Hypertension Father    Heart disease Father    Diabetes Father    Diabetes Maternal Grandmother    Kidney disease Maternal Grandfather    Kidney disease Maternal Uncle    Colon cancer Neg Hx    Esophageal cancer Neg Hx    Rectal cancer Neg Hx    Stomach cancer Neg Hx       Review of Systems  Constitutional: negative for fatigue and weight loss Respiratory: negative for cough and wheezing Cardiovascular: negative for chest pain, fatigue and  palpitations Gastrointestinal: negative for abdominal pain and change in bowel habits Musculoskeletal:negative for myalgias Neurological: negative for gait problems and tremors Behavioral/Psych: negative for abusive relationship, depression Endocrine: negative for temperature intolerance    Genitourinary:positive for abnormal menstrual periods.  Negative for genital lesions, hot flashes, sexual problems and vaginal discharge Integument/breast: negative for breast lump, breast tenderness, nipple discharge and skin lesion(s)    Objective:       BP 120/84   Pulse (!) 111   Ht 5' (1.524 m)   Wt 185 lb (83.9 kg)   LMP 07/07/2021   BMI 36.13 kg/m  General:   Alert and no distress  Skin:   no rash or abnormalities  Lungs:   clear to auscultation bilaterally  Heart:   regular rate and rhythm, S1, S2 normal, no murmur, click, rub or gallop  Breasts:   normal without suspicious masses, skin or nipple changes or axillary nodes  Abdomen:  normal findings: no organomegaly, soft, non-tender and no hernia  Pelvis:  External genitalia: normal general appearance Urinary system: urethral meatus normal and bladder without fullness, nontender Vaginal: normal without tenderness, induration or masses Cervix: normal appearance Adnexa: normal bimanual exam Uterus: anteverted and non-tender, normal size   Lab Review Urine pregnancy test Labs reviewed yes Radiologic studies reviewed no  I have spent a total of 20 minutes of face-to-face time, excluding clinical staff time, reviewing notes and preparing to see patient, ordering tests and/or medications, and counseling the patient.                         Assessment:    1. Encounter for routine gynecological examination with Papanicolaou smear of cervix Rx: - Cytology - PAP( [place])  2. Abnormal uterine bleeding (AUB) Rx: - US PELVIC C:OMPLETE WITH TRANSVAGINAL; Future  3. Disorder of ovulation Rx: - DG Hysterogram (HSD); Future -  Ambulatory referral to Endocrinology  4. History of reversal of tubal ligation Rx: - DG Hysterogram ([facility]); Future - Ambulatory referral to Endocrinology  5. Class 3 severe obesity without serious comorbidity in adult, unspecified BMI, unspecified obesity type ([place]) - weight reduction with the aid of dietary changes, exercise and behavioral modification recommended     Plan:    Education reviewed: calcium supplements, depression evaluation, low fat, low cholesterol diet, safe sex/STD prevention, self breast exams, and weight bearing exercise. Follow up in: a few months.    Orders Placed This Encounter  Procedures   US PELVIC COMPLETE WITH TRANSVAGINAL    BCBS STATE  Epic ORDER [place] OFFICE WT:[id number] NO NEEDS NO GLUCOSE MONITOR,BODY INJECTORS, OR SPINAL CORD STIMULATORS NO TO COVID PT INFORMED CANCELLATION POLICY/YP W/TORI '@OFFICE'$  Prep: Patient must have full bladder- Drink 32 oz of water 1 hour before  appointment and do not void after starting to drink until exam is over.      Standing Status:   Future    Standing Expiration Date:   07/16/2022    Order Specific Question:   Reason for Exam (SYMPTOM  OR DIAGNOSIS REQUIRED)    Answer:   AUB    Order Specific Question:   Preferred imaging location?    Answer:   [facility]   DG Hysterogram ([facility]SD)    LMP:07/09/21 IODINE ALLERGY:NO BCBS STATE  Epic ORDER [place] OFFICE WT:[id number] NO NEEDS NO GLUCOSE MONITOR,BODY INJECTORS, OR SPINAL CORD STIMULATORS NO TO COVID PT INFORMED CANCELLATION POLICY/YP W/TORI '@OFFICE'$  We ask that you abstain from unprotected intercourse from day 1 of your period until after the HSG.  07/15/21 PT STATED SHE HAD UNPROTECTED INTERCOURSE BEFORE HSG EXAM SO WE WOULD NEED HER TO CB NEXT MONTH TO RESCHEDULE/YP    Standing Status:   Future    Standing Expiration Date:   07/16/2022    Order Specific Question:   If indicated for the ordered procedure, I authorize the administration of contrast  media per Radiology protocol    Answer:   Yes    Order Specific Question:   Reason for Exam (SYMPTOM  OR DIAGNOSIS REQUIRED)    Answer:   Tubal reversal.   Disorder of Ovulation.    Order Specific Question:   Is the patient pregnant?    Answer:   Yes    Order Specific Question:   Preferred Imaging Location?    Answer:   [facility]   Ambulatory referral to Endocrinology    Referral Priority:   Routine    Referral Type:   Consultation    Referral Reason:   Specialty Services Required    Number of Visits Requested:   1          Shelly Bombard, MD 07/15/21

## 2021-07-17 LAB — CYTOLOGY - PAP
Comment: NEGATIVE
Diagnosis: NEGATIVE
High risk HPV: NEGATIVE

## 2021-07-18 ENCOUNTER — Ambulatory Visit
Admission: RE | Admit: 2021-07-18 | Discharge: 2021-07-18 | Disposition: A | Discharge status: Home / Self Care | Payer: BC Managed Care – PPO | Source: Ambulatory Visit | Attending: Obstetrics | Admitting: Obstetrics

## 2021-07-18 DIAGNOSIS — N939 Abnormal uterine and vaginal bleeding, unspecified: Secondary | ICD-10-CM

## 2021-07-22 ENCOUNTER — Encounter: Payer: Self-pay | Admitting: Family Medicine

## 2021-07-22 ENCOUNTER — Ambulatory Visit: Payer: BC Managed Care – PPO | Admitting: Family Medicine

## 2021-07-22 VITALS — BP 118/80 | HR 86 | Temp 98.1°F | Resp 16 | Wt 184.0 lb

## 2021-07-22 DIAGNOSIS — F341 Dysthymic disorder: Secondary | ICD-10-CM | POA: Diagnosis not present

## 2021-07-22 DIAGNOSIS — F902 Attention-deficit hyperactivity disorder, combined type: Secondary | ICD-10-CM

## 2021-07-22 DIAGNOSIS — N920 Excessive and frequent menstruation with regular cycle: Secondary | ICD-10-CM | POA: Diagnosis not present

## 2021-07-22 MED ORDER — AMPHETAMINE-DEXTROAMPHET ER 25 MG PO CP24: 25.0000 mg | ORAL_CAPSULE | ORAL | 0 refills | Status: DC

## 2021-07-22 MED ORDER — AMPHETAMINE-DEXTROAMPHETAMINE 5 MG PO TABS: 5.0000 mg | ORAL_TABLET | Freq: Every day | ORAL | 0 refills | Status: DC

## 2021-07-22 NOTE — Progress Notes (Signed)
Established Patient Office Visit  Subjective    Patient ID: Aimee King, female    DOB: [birth year]  Age: 41 y.o. MRN: [id number]  CC:  Chief Complaint  Patient presents with   Follow-up    HPI Aimee King presents for follow up of ADHD and dysthymia. Patient reports much improvement with present management.    Outpatient Encounter Medications as of 07/22/2021  Medication Sig   albuterol (VENTOLIN HFA) 108 (90 Base) MCG/ACT inhaler Inhale into the lungs every 6 (six) hours as needed for wheezing or shortness of breath.   buPROPion (WELLBUTRIN XL) 150 MG 24 hr tablet Take 1 tablet (150 mg total) by mouth daily.   ferrous sulfate 325 (65 FE) MG tablet Take 1 tablet (325 mg total) by mouth 3 (three) times daily with meals. Ok to take once per day or once every other day if constipation develops   fexofenadine (ALLEGRA) 180 MG tablet Take 180 mg by mouth daily.   [DISCONTINUED] amphetamine-dextroamphetamine (ADDERALL XR) 25 MG 24 hr capsule Take 1 capsule by mouth every morning.   [DISCONTINUED] amphetamine-dextroamphetamine (ADDERALL) 5 MG tablet Take 1 tablet (5 mg total) by mouth daily.   amphetamine-dextroamphetamine (ADDERALL XR) 25 MG 24 hr capsule Take 1 capsule by mouth every morning.   amphetamine-dextroamphetamine (ADDERALL) 5 MG tablet Take 1 tablet (5 mg total) by mouth daily.   megestrol (MEGACE) 40 MG tablet Take 1 tablet (40 mg total) by mouth daily. (Patient not taking: Reported on 07/15/2021)   No facility-administered encounter medications on file as of 07/22/2021.    Past Medical History:  Diagnosis Date   Acid reflux    ADD (attention deficit disorder)    Allergy    Anemia    Attention deficit disorder (ADD)    Depression    Endometriosis    IBS (irritable bowel syndrome)     Past Surgical History:  Procedure Laterality Date   CESAREAN SECTION  2004, 2008   CESAREAN SECTION     CHOLECYSTECTOMY  02/23/2002   CHOLECYSTECTOMY      LAPAROSCOPY  02/24/2000   endometriosis   TUBAL LIGATION  02/23/2006   TUBAL LIGATION     tubal ligation reversal Right     Family History  Problem Relation Age of Onset   Hypertension Mother    Diabetes Mother    Hypertension Father    Heart disease Father    Diabetes Father    Diabetes Maternal Grandmother    Kidney disease Maternal Grandfather    Kidney disease Maternal Uncle    Colon cancer Neg Hx    Esophageal cancer Neg Hx    Rectal cancer Neg Hx    Stomach cancer Neg Hx     Social History   Socioeconomic History   Marital status: Married    Spouse name: Not on file   Number of children: 3   Years of education: Not on file   Highest education level: Not on file  Occupational History   Occupation: educator   Tobacco Use   Smoking status: Never   Smokeless tobacco: Never  Vaping Use   Vaping Use: Never used  Substance and Sexual Activity   Alcohol use: Yes    Alcohol/week: 0.0 standard drinks    Comment: ocassionally   Drug use: No   Sexual activity: Yes    Partners: Male    Birth control/protection: None  Other Topics Concern   Not on file  Social History Narrative   ** Merged  History Encounter **       Social Determinants of Health   Financial Resource Strain: Not on file  Food Insecurity: Not on file  Transportation Needs: Not on file  Physical Activity: Not on file  Stress: Not on file  Social Connections: Not on file  Intimate Partner Violence: Not on file    Review of Systems  All other systems reviewed and are negative.      Objective    BP 118/80   Pulse 86   Temp 98.1 F (36.7 C) (Oral)   Resp 16   Wt 184 lb (83.5 kg)   LMP 07/07/2021   SpO2 95%   BMI 35.94 kg/m   Physical Exam Vitals and nursing note reviewed.  Constitutional:      General: She is not in acute distress.    Appearance: She is obese.  Cardiovascular:     Rate and Rhythm: Normal rate and regular rhythm.  Pulmonary:     Effort: Pulmonary effort is  normal.     Breath sounds: Normal breath sounds.  Abdominal:     Palpations: Abdomen is soft.     Tenderness: There is no abdominal tenderness.  Neurological:     General: No focal deficit present.     Mental Status: She is alert and oriented to person, place, and time.  Psychiatric:        Mood and Affect: Mood and affect normal.        Speech: Speech normal.        Behavior: Behavior normal. Behavior is cooperative.        Assessment & Plan:   1. Attention deficit hyperactivity disorder (ADHD), combined type Much improved. Continue present management. Meds refilled.   2. Dysthymia Appears much improved and stable with present management. Continue and monitor  3. Menorrhagia with regular cycle Management as per consultant.     Return in about 3 months (around 10/22/2021) for follow up.   Becky Sax, MD

## 2021-08-02 ENCOUNTER — Encounter: Payer: Self-pay | Admitting: Obstetrics

## 2021-08-07 ENCOUNTER — Other Ambulatory Visit: Payer: Self-pay | Admitting: Obstetrics

## 2021-08-07 DIAGNOSIS — M545 Low back pain, unspecified: Secondary | ICD-10-CM

## 2021-09-14 ENCOUNTER — Other Ambulatory Visit: Payer: Self-pay | Admitting: Family Medicine

## 2021-09-20 MED ORDER — BUPROPION HCL ER (XL) 150 MG PO TB24: 150.0000 mg | ORAL_TABLET | Freq: Every day | ORAL | 0 refills | Status: DC

## 2021-09-20 MED ORDER — AMPHETAMINE-DEXTROAMPHET ER 25 MG PO CP24: 25.0000 mg | ORAL_CAPSULE | ORAL | 0 refills | Status: DC

## 2021-09-20 MED ORDER — AMPHETAMINE-DEXTROAMPHETAMINE 5 MG PO TABS: 5.0000 mg | ORAL_TABLET | Freq: Every day | ORAL | 0 refills | Status: DC

## 2021-10-10 ENCOUNTER — Ambulatory Visit (INDEPENDENT_AMBULATORY_CARE_PROVIDER_SITE_OTHER): Payer: BC Managed Care – PPO | Admitting: Obstetrics and Gynecology

## 2021-10-10 ENCOUNTER — Encounter: Payer: Self-pay | Admitting: Obstetrics and Gynecology

## 2021-10-10 ENCOUNTER — Other Ambulatory Visit (HOSPITAL_COMMUNITY)
Admission: RE | Admit: 2021-10-10 | Discharge: 2021-10-10 | Disposition: A | Discharge status: Home / Self Care | Payer: BC Managed Care – PPO | Source: Ambulatory Visit | Attending: Obstetrics and Gynecology | Admitting: Obstetrics and Gynecology

## 2021-10-10 VITALS — BP 110/76 | HR 76 | Ht 60.0 in | Wt 185.5 lb

## 2021-10-10 DIAGNOSIS — R9389 Abnormal findings on diagnostic imaging of other specified body structures: Secondary | ICD-10-CM | POA: Diagnosis present

## 2021-10-10 DIAGNOSIS — N920 Excessive and frequent menstruation with regular cycle: Secondary | ICD-10-CM | POA: Insufficient documentation

## 2021-10-10 LAB — POCT URINE PREGNANCY: Preg Test, Ur: NEGATIVE

## 2021-10-10 NOTE — Progress Notes (Signed)
Ms Barron is here for [facility] due to thicken endometrium noted on  U/S     ENDOMETRIAL BIOPSY     The indications for endometrial biopsy were reviewed.   Risks of the biopsy including cramping, bleeding, infection, uterine perforation, inadequate specimen and need for additional procedures  were discussed. The patient states she understands and agrees to undergo procedure today. Consent was signed. Time out was performed. Urine HCG was negative. During the pelvic exam, the cervix was prepped with Betadine. A single-toothed tenaculum was placed on the anterior lip of the cervix to stabilize it. The 3 mm pipelle was introduced into the endometrial cavity without difficulty to a depth of 8cm, and a moderate amount of tissue was obtained and sent to pathology. The instruments were removed from the patient's vagina. Minimal bleeding from the cervix was noted. The patient tolerated the procedure well. Routine post-procedure instructions were given to the patient.      A/P [facility] Endometrium  S/P EMBX. F/U as per Bx results

## 2021-10-10 NOTE — Progress Notes (Signed)
UPT - Negative

## 2021-10-10 NOTE — Patient Instructions (Signed)
ENDOMETRIAL BIOPSY POST-PROCEDURE INSTRUCTIONS  You may take Ibuprofen, Aleve or Tylenol for pain if needed.  Cramping should resolve within in 24 hours.  You may have a small amount of spotting.  You should wear a mini pad for the next few days.  You may have intercourse after 24 hours.  You need to call if you have any pelvic pain, fever, heavy bleeding or foul smelling vaginal discharge.  Shower or bathe as normal  6. We will call you within one week with results or we will discuss   the results at your follow-up appointment if needed.

## 2021-10-13 LAB — SURGICAL PATHOLOGY

## 2021-10-20 ENCOUNTER — Ambulatory Visit: Payer: BC Managed Care – PPO | Admitting: Family Medicine

## 2021-11-03 ENCOUNTER — Encounter: Payer: Self-pay | Admitting: Family Medicine

## 2021-11-03 ENCOUNTER — Other Ambulatory Visit: Payer: Self-pay | Admitting: Family Medicine

## 2021-11-03 MED ORDER — AMPHETAMINE-DEXTROAMPHETAMINE 5 MG PO TABS
5.0000 mg | ORAL_TABLET | Freq: Every day | ORAL | 0 refills | Status: DC
Start: 2021-11-03 — End: 2021-12-16

## 2021-11-03 MED ORDER — AMPHETAMINE-DEXTROAMPHET ER 25 MG PO CP24: 25.0000 mg | ORAL_CAPSULE | ORAL | 0 refills | Status: DC

## 2021-11-07 ENCOUNTER — Encounter: Payer: Self-pay | Admitting: Family Medicine

## 2021-11-07 ENCOUNTER — Ambulatory Visit: Payer: BC Managed Care – PPO | Admitting: Family Medicine

## 2021-11-07 VITALS — BP 112/77 | HR 89 | Temp 98.1°F | Resp 16 | Wt 187.4 lb

## 2021-11-07 DIAGNOSIS — F902 Attention-deficit hyperactivity disorder, combined type: Secondary | ICD-10-CM

## 2021-11-07 DIAGNOSIS — E6609 Other obesity due to excess calories: Secondary | ICD-10-CM | POA: Diagnosis not present

## 2021-11-07 DIAGNOSIS — Z6836 Body mass index (BMI) 36.0-36.9, adult: Secondary | ICD-10-CM

## 2021-11-08 ENCOUNTER — Encounter: Payer: Self-pay | Admitting: Family Medicine

## 2021-11-08 NOTE — Progress Notes (Signed)
Established Patient Office Visit  Subjective    Patient ID: Aimee King, female    DOB: 08-05-1980  Age: 41 y.o. MRN: [id number]  CC: No chief complaint on file.   HPI Aimee King presents for routine follow up of chronic med issues. Patient denies acute complaints or concerns.    Outpatient Encounter Medications as of 11/07/2021  Medication Sig   albuterol (VENTOLIN HFA) 108 (90 Base) MCG/ACT inhaler Inhale into the lungs every 6 (six) hours as needed for wheezing or shortness of breath.   amphetamine-dextroamphetamine (ADDERALL XR) 25 MG 24 hr capsule Take 1 capsule by mouth every morning.   amphetamine-dextroamphetamine (ADDERALL) 5 MG tablet Take 1 tablet (5 mg total) by mouth daily.   buPROPion (WELLBUTRIN XL) 150 MG 24 hr tablet Take 1 tablet (150 mg total) by mouth daily.   ferrous sulfate 325 (65 FE) MG tablet Take 1 tablet (325 mg total) by mouth 3 (three) times daily with meals. Ok to take once per day or once every other day if constipation develops   fexofenadine (ALLEGRA) 180 MG tablet Take 180 mg by mouth daily.   No facility-administered encounter medications on file as of 11/07/2021.    Past Medical History:  Diagnosis Date   Acid reflux    ADD (attention deficit disorder)    Allergy    Anemia    Attention deficit disorder (ADD)    Depression    Endometriosis    IBS (irritable bowel syndrome)     Past Surgical History:  Procedure Laterality Date   CESAREAN SECTION  2004, 2008   CESAREAN SECTION     CHOLECYSTECTOMY  02/23/2002   CHOLECYSTECTOMY     LAPAROSCOPY  02/24/2000   endometriosis   TUBAL LIGATION  02/23/2006   TUBAL LIGATION     tubal ligation reversal Right     Family History  Problem Relation Age of Onset   Hypertension Mother    Diabetes Mother    Hypertension Father    Heart disease Father    Diabetes Father    Diabetes Maternal Grandmother    Kidney disease Maternal Grandfather    Kidney disease Maternal  Uncle    Colon cancer Neg Hx    Esophageal cancer Neg Hx    Rectal cancer Neg Hx    Stomach cancer Neg Hx     Social History   Socioeconomic History   Marital status: Married    Spouse name: Not on file   Number of children: 3   Years of education: Not on file   Highest education level: Not on file  Occupational History   Occupation: educator   Tobacco Use   Smoking status: Never   Smokeless tobacco: Never  Vaping Use   Vaping Use: Never used  Substance and Sexual Activity   Alcohol use: Yes    Alcohol/week: 0.0 standard drinks of alcohol    Comment: ocassionally   Drug use: No   Sexual activity: Yes    Partners: Male    Birth control/protection: None  Other Topics Concern   Not on file  Social History Narrative   ** Merged History Encounter **       Social Determinants of Health   Financial Resource Strain: Low Risk  (11/02/2018)   Overall Financial Resource Strain (CARDIA)    Difficulty of Paying Living Expenses: Not hard at all  Food Insecurity: No Food Insecurity (11/02/2018)   Hunger Vital Sign    Worried About Running Out of  Food in the Last Year: Never true    [place] in the Last Year: Never true  Transportation Needs: No Transportation Needs (11/02/2018)   PRAPARE - Hydrologist (Medical): No    Lack of Transportation (Non-Medical): No  Physical Activity: Insufficiently Active (11/02/2018)   Exercise Vital Sign    Days of Exercise per Week: 3 days    Minutes of Exercise per Session: 30 min  Stress: No Stress Concern Present (11/02/2018)   [place]    Feeling of Stress : Only a little  Social Connections: Not on file  Intimate Partner Violence: Not on file    Review of Systems  All other systems reviewed and are negative.       Objective    BP 112/77   Pulse 89   Temp 98.1 F (36.7 C) (Oral)   Resp 16   Wt 187 lb 6.4 oz (85 kg)   SpO2 95%    BMI 36.60 kg/m   Physical Exam Vitals and nursing note reviewed.  Constitutional:      General: She is not in acute distress.    Appearance: She is obese.  Cardiovascular:     Rate and Rhythm: Normal rate and regular rhythm.  Pulmonary:     Effort: Pulmonary effort is normal.     Breath sounds: Normal breath sounds.  Abdominal:     Palpations: Abdomen is soft.     Tenderness: There is no abdominal tenderness.  Neurological:     General: No focal deficit present.     Mental Status: She is alert and oriented to person, place, and time.  Psychiatric:        Mood and Affect: Mood and affect normal.        Speech: Speech normal.        Behavior: Behavior normal. Behavior is cooperative.         Assessment & Plan:   1. Attention deficit hyperactivity disorder (ADHD), combined type Appears stable. Continue present management.   2. Class 2 obesity due to excess calories without serious comorbidity with body mass index (BMI) of 36.0 to 36.9 in adult Goal is 2-4lbs/mo wt loss    No follow-ups on file.   Becky Sax, MD

## 2021-12-11 ENCOUNTER — Ambulatory Visit: Payer: Self-pay

## 2021-12-11 NOTE — Telephone Encounter (Signed)
FYI

## 2021-12-11 NOTE — Telephone Encounter (Signed)
Chief Complaint: Vaginal bleeding Symptoms: ibid Frequency: today Pertinent Negatives: Patient denies  Disposition: '[]'$ ED /'[x]'$ Urgent Care (no appt availability in office) / '[]'$ Appointment(In office/virtual)/ '[]'$  [place] [facility]/ '[]'$ Home Care/ '[]'$ Refused Recommended Disposition /'[]'$ [place] [facility]/ '[]'$  Follow-up with PCP Additional Notes: Pt thinks she may be miscarrying. No period for 2.5 months. Bright red blood today. 1 pad over 3 hours, with cramping. Pt took several urine pregnancy tests with different results. Pt had + ,- , inconclusive results. Pt would like a blood pregnancy test. Pt will go to UC for testing.    Reason for Disposition  [1] Menstrual cycle < 21 days OR > 35 days AND [2] has occurred once this past year  Answer Assessment - Initial Assessment Questions 1. LOCATION: "Where does it hurt?"      Abdominal Cramping 2. RADIATION: "Does the pain shoot anywhere else?" (e.g., chest, back)     Right side lower abdomen 3. ONSET: "When did the pain begin?" (e.g., minutes, hours or days ago)      Today 4. SUDDEN: "Gradual or sudden onset?"     sudden 5. PATTERN "Does the pain come and go, or is it constant?"    - If it comes and goes: "How long does it last?" "Do you have pain now?"     (Note: Comes and goes means the pain is intermittent. It goes away completely between bouts.)    - If constant: "Is it getting better, staying the same, or getting worse?"      (Note: Constant means the pain never goes away completely; most serious pain is constant and gets worse.)      Dull ache 6. SEVERITY: "How bad is the pain?"  (e.g., Scale 1-10; mild, moderate, or severe)    - MILD (1-3): Doesn't interfere with normal activities, abdomen soft and not tender to touch.     - MODERATE (4-7): Interferes with normal activities or awakens from sleep, abdomen tender to touch.     - SEVERE (8-10): Excruciating pain, doubled over, unable to do any normal activities.       mild 7.  RECURRENT SYMPTOM: "Have you ever had this type of stomach pain before?" If Yes, ask: "When was the last time?" and "What happened that time?"       8. CAUSE: "What do you think is causing the stomach pain?"      9. RELIEVING/AGGRAVATING FACTORS: "What makes it better or worse?" (e.g., antacids, bending or twisting motion, bowel movement)      10. OTHER SYMPTOMS: "Do you have any other symptoms?" (e.g., back pain, diarrhea, fever, urination pain, vomiting)        11. PREGNANCY: "Is there any chance you are pregnant?" "When was your last menstrual period?"       Possibly  Answer Assessment - Initial Assessment Questions 1. AMOUNT: "Describe the bleeding that you are having."    - SPOTTING: spotting, or pinkish / brownish mucous discharge; does not fill panty liner or pad    - MILD:  less than 1 pad / hour; less than patient's usual menstrual bleeding   - MODERATE: 1-2 pads / hour; 1 menstrual cup every 6 hours; small-medium blood clots (e.g., pea, grape, small coin)   - SEVERE: soaking 2 or more pads/hour for 2 or more hours; 1 menstrual cup every 2 hours; bleeding not contained by pads or continuous red blood from vagina; large blood clots (e.g., golf ball, large coin)      1 pad  3 hours bright red 2. ONSET: "When did the bleeding begin?" "Is it continuing now?"     Today with cramoing 3. MENSTRUAL PERIOD: "When was the last normal menstrual period?" "How is this different than your period?"     No period for 2.5 months 4. REGULARITY: "How regular are your periods?"      5. ABDOMEN PAIN: "Do you have any pain?" "How bad is the pain?"  (e.g., Scale 1-10; mild, moderate, or severe)   - MILD (1-3): doesn't interfere with normal activities, abdomen soft and not tender to touch    - MODERATE (4-7): interferes with normal activities or awakens from sleep, abdomen tender to touch    - SEVERE (8-10): excruciating pain, doubled over, unable to do any normal activities      mild 6. PREGNANCY: "Is  there any chance you are pregnant?" "When was your last menstrual period?"     Pt thinks yes 7. BREASTFEEDING: "Are you breastfeeding?"      8. HORMONE MEDICINES: "Are you taking any hormone medicines, prescription or over-the-counter?" (e.g., birth control pills, estrogen)      9. BLOOD THINNER MEDICINES: "Do you take any blood thinners?" (e.g., Coumadin / warfarin, Pradaxa / dabigatran, aspirin)      10. CAUSE: "What do you think is causing the bleeding?" (e.g., recent gyn surgery, recent gyn procedure; known bleeding disorder, cervical cancer, polycystic ovarian disease, fibroids)         Pregnancy loss 11. HEMODYNAMIC STATUS: "Are you weak or feeling lightheaded?" If Yes, ask: "Can you stand and walk normally?"         12. OTHER SYMPTOMS: "What other symptoms are you having with the bleeding?" (e.g., passed tissue, vaginal discharge, fever, menstrual-type cramps)  Protocols used: Abdominal Pain - Female-A-AH, Vaginal Bleeding - Abnormal-A-AH

## 2021-12-16 ENCOUNTER — Other Ambulatory Visit: Payer: Self-pay | Admitting: Family Medicine

## 2021-12-16 MED ORDER — AMPHETAMINE-DEXTROAMPHET ER 25 MG PO CP24: 25.0000 mg | ORAL_CAPSULE | ORAL | 0 refills | Status: DC

## 2021-12-16 MED ORDER — AMPHETAMINE-DEXTROAMPHETAMINE 5 MG PO TABS
5.0000 mg | ORAL_TABLET | Freq: Every day | ORAL | 0 refills | Status: DC
Start: 2021-12-16 — End: 2022-01-17

## 2022-01-17 ENCOUNTER — Other Ambulatory Visit: Payer: Self-pay | Admitting: Family Medicine

## 2022-01-20 MED ORDER — AMPHETAMINE-DEXTROAMPHET ER 25 MG PO CP24: 25.0000 mg | ORAL_CAPSULE | ORAL | 0 refills | Status: DC

## 2022-01-20 MED ORDER — AMPHETAMINE-DEXTROAMPHETAMINE 5 MG PO TABS: 5.0000 mg | ORAL_TABLET | Freq: Every day | ORAL | 0 refills | Status: DC

## 2022-02-12 ENCOUNTER — Ambulatory Visit: Payer: BC Managed Care – PPO | Admitting: Family Medicine

## 2022-03-11 ENCOUNTER — Encounter: Payer: Self-pay | Admitting: Family Medicine

## 2022-03-11 ENCOUNTER — Ambulatory Visit: Payer: BC Managed Care – PPO | Admitting: Family Medicine

## 2022-03-11 VITALS — BP 116/80 | HR 106 | Temp 98.1°F | Resp 16 | Wt 192.6 lb

## 2022-03-11 DIAGNOSIS — F902 Attention-deficit hyperactivity disorder, combined type: Secondary | ICD-10-CM | POA: Diagnosis not present

## 2022-03-11 MED ORDER — AMPHETAMINE-DEXTROAMPHET ER 30 MG PO CP24: 30.0000 mg | ORAL_CAPSULE | ORAL | 0 refills | Status: DC

## 2022-03-11 MED ORDER — AMPHETAMINE-DEXTROAMPHETAMINE 5 MG PO TABS: 5.0000 mg | ORAL_TABLET | Freq: Every day | ORAL | 0 refills | Status: DC

## 2022-03-11 NOTE — Progress Notes (Signed)
Patient is here for their 6 month follow-up Patient has no concerns today Care gaps have been discussed with patient  

## 2022-03-12 ENCOUNTER — Telehealth: Payer: Self-pay | Admitting: Family Medicine

## 2022-03-12 NOTE — Telephone Encounter (Signed)
Jari Favre calling from [facility] is calling to request clinical information for the patient for amphetamine-dextroamphetamine (ADDERALL) 5 MG tablet [[id number]]   CB- [phone]

## 2022-03-13 NOTE — Telephone Encounter (Signed)
Cover my has been updated

## 2022-03-13 NOTE — Progress Notes (Signed)
Established Patient Office Visit  Subjective    Patient ID: Aimee King, female    DOB: [birth year]  Age: 42 y.o. MRN: [id number]  CC:  Chief Complaint  Patient presents with   Follow-up    HPI Aimee King presents to establish care for routine follow up of ADHD. Patient reports that the doses seem to wear off before it should. She would like to see if there can be some adjustments in her meds.    Outpatient Encounter Medications as of 03/11/2022  Medication Sig   amphetamine-dextroamphetamine (ADDERALL XR) 30 MG 24 hr capsule Take 1 capsule (30 mg total) by mouth every morning.   albuterol (VENTOLIN HFA) 108 (90 Base) MCG/ACT inhaler Inhale into the lungs every 6 (six) hours as needed for wheezing or shortness of breath.   amphetamine-dextroamphetamine (ADDERALL XR) 25 MG 24 hr capsule Take 1 capsule by mouth every morning.   amphetamine-dextroamphetamine (ADDERALL) 5 MG tablet Take 1 tablet (5 mg total) by mouth daily.   buPROPion (WELLBUTRIN XL) 150 MG 24 hr tablet TAKE 1 TABLET BY MOUTH EVERY DAY   ferrous sulfate 325 (65 FE) MG tablet Take 1 tablet (325 mg total) by mouth 3 (three) times daily with meals. Ok to take once per day or once every other day if constipation develops   fexofenadine (ALLEGRA) 180 MG tablet Take 180 mg by mouth daily.   [DISCONTINUED] amphetamine-dextroamphetamine (ADDERALL) 5 MG tablet Take 1 tablet (5 mg total) by mouth daily.   No facility-administered encounter medications on file as of 03/11/2022.    Past Medical History:  Diagnosis Date   Acid reflux    ADD (attention deficit disorder)    Allergy    Anemia    Attention deficit disorder (ADD)    Depression    Endometriosis    IBS (irritable bowel syndrome)     Past Surgical History:  Procedure Laterality Date   CESAREAN SECTION  2004, 2008   CESAREAN SECTION     CHOLECYSTECTOMY  02/23/2002   CHOLECYSTECTOMY     LAPAROSCOPY  02/24/2000   endometriosis   TUBAL  LIGATION  02/23/2006   TUBAL LIGATION     tubal ligation reversal Right     Family History  Problem Relation Age of Onset   Hypertension Mother    Diabetes Mother    Hypertension Father    Heart disease Father    Diabetes Father    Diabetes Maternal Grandmother    Kidney disease Maternal Grandfather    Kidney disease Maternal Uncle    Colon cancer Neg Hx    Esophageal cancer Neg Hx    Rectal cancer Neg Hx    Stomach cancer Neg Hx     Social History   Socioeconomic History   Marital status: Married    Spouse name: Not on file   Number of children: 3   Years of education: Not on file   Highest education level: Not on file  Occupational History   Occupation: educator   Tobacco Use   Smoking status: Never   Smokeless tobacco: Never  Vaping Use   Vaping Use: Never used  Substance and Sexual Activity   Alcohol use: Yes    Alcohol/week: 0.0 standard drinks of alcohol    Comment: ocassionally   Drug use: No   Sexual activity: Yes    Partners: Male    Birth control/protection: None  Other Topics Concern   Not on file  Social History Narrative   **  Merged History Encounter **       Social Determinants of Health   Financial Resource Strain: Low Risk  (11/02/2018)   Overall Financial Resource Strain (CARDIA)    Difficulty of Paying Living Expenses: Not hard at all  Food Insecurity: No Food Insecurity (11/02/2018)   Hunger Vital Sign    Worried About Running Out of Food in the Last Year: Never true    [place] in the Last Year: Never true  Transportation Needs: No Transportation Needs (11/02/2018)   PRAPARE - Hydrologist (Medical): No    Lack of Transportation (Non-Medical): No  Physical Activity: Insufficiently Active (11/02/2018)   Exercise Vital Sign    Days of Exercise per Week: 3 days    Minutes of Exercise per Session: 30 min  Stress: No Stress Concern Present (11/02/2018)   [place]    Feeling of Stress : Only a little  Social Connections: Not on file  Intimate Partner Violence: Not on file    Review of Systems  All other systems reviewed and are negative.       Objective    BP 116/80   Pulse (!) 106   Temp 98.1 F (36.7 C) (Oral)   Resp 16   Wt 192 lb 9.6 oz (87.4 kg)   SpO2 98%   BMI 37.61 kg/m   Physical Exam Vitals and nursing note reviewed.  Constitutional:      General: She is not in acute distress.    Appearance: She is obese.  Cardiovascular:     Rate and Rhythm: Normal rate and regular rhythm.  Pulmonary:     Effort: Pulmonary effort is normal.     Breath sounds: Normal breath sounds.  Abdominal:     Palpations: Abdomen is soft.     Tenderness: There is no abdominal tenderness.  Neurological:     General: No focal deficit present.     Mental Status: She is alert and oriented to person, place, and time.  Psychiatric:        Mood and Affect: Mood and affect normal.        Speech: Speech normal.        Behavior: Behavior normal. Behavior is cooperative.         Assessment & Plan:   1. Attention deficit hyperactivity disorder (ADHD), combined type Will increase Adderall XR from 25 to 30 mg and will continue 5 mg afternoon dose. Monitor.    Return in about 4 weeks (around 04/08/2022) for follow up.   Becky Sax, MD

## 2022-03-29 ENCOUNTER — Encounter: Payer: Self-pay | Admitting: Obstetrics

## 2022-03-29 ENCOUNTER — Encounter: Payer: Self-pay | Admitting: Family Medicine

## 2022-04-16 ENCOUNTER — Ambulatory Visit: Payer: BC Managed Care – PPO | Admitting: Family Medicine

## 2022-04-16 VITALS — BP 109/77 | HR 100 | Temp 98.1°F | Resp 16 | Wt 190.6 lb

## 2022-04-16 DIAGNOSIS — N921 Excessive and frequent menstruation with irregular cycle: Secondary | ICD-10-CM

## 2022-04-16 DIAGNOSIS — F341 Dysthymic disorder: Secondary | ICD-10-CM | POA: Diagnosis not present

## 2022-04-16 DIAGNOSIS — F902 Attention-deficit hyperactivity disorder, combined type: Secondary | ICD-10-CM | POA: Diagnosis not present

## 2022-04-16 DIAGNOSIS — R1030 Lower abdominal pain, unspecified: Secondary | ICD-10-CM | POA: Diagnosis not present

## 2022-04-16 MED ORDER — BUPROPION HCL ER (XL) 150 MG PO TB24: 150.0000 mg | ORAL_TABLET | Freq: Every day | ORAL | 0 refills | Status: DC

## 2022-04-16 MED ORDER — OXYCODONE-ACETAMINOPHEN 5-325 MG PO TABS: 1.0000 | ORAL_TABLET | ORAL | 0 refills | Status: AC | PRN

## 2022-04-16 NOTE — Progress Notes (Signed)
Patient is here for 4 week follow-up  for medication refill. Patient c/o lower abdomen pain with pressure. Patient denies UTI symptoms.

## 2022-04-16 NOTE — Progress Notes (Signed)
Established Patient Office Visit  Subjective    Patient ID: Aimee King, female    DOB: [birth year]  Age: 42 y.o. MRN: [id number]  CC:  Chief Complaint  Patient presents with   Follow-up    HPI [name] presents with complaint of abdominal pain. Patient reports that she is having some intermittent nausea but not any vomiting or diarrhea/constipation. She reports that she has had menorrhagia on last month with a 18 day period and has been bleeding about 4 days at this time but this has abruptly stopped. Pain is a 10+/10. Patient had a tubal reversal in 2021. She had no sx since that time but has only had irregular menses since March 2023.    Outpatient Encounter Medications as of 04/16/2022  Medication Sig   albuterol (VENTOLIN HFA) 108 (90 Base) MCG/ACT inhaler Inhale into the lungs every 6 (six) hours as needed for wheezing or shortness of breath.   amphetamine-dextroamphetamine (ADDERALL XR) 30 MG 24 hr capsule Take 1 capsule (30 mg total) by mouth every morning.   amphetamine-dextroamphetamine (ADDERALL) 5 MG tablet Take 1 tablet (5 mg total) by mouth daily.   ferrous sulfate 325 (65 FE) MG tablet Take 1 tablet (325 mg total) by mouth 3 (three) times daily with meals. Ok to take once per day or once every other day if constipation develops   fexofenadine (ALLEGRA) 180 MG tablet Take 180 mg by mouth daily.   oxyCODONE-acetaminophen (PERCOCET) 5-325 MG tablet Take 1-2 tablets by mouth every 4 (four) hours as needed for up to 3 days for severe pain.   [DISCONTINUED] buPROPion (WELLBUTRIN XL) 150 MG 24 hr tablet TAKE 1 TABLET BY MOUTH EVERY DAY   buPROPion (WELLBUTRIN XL) 150 MG 24 hr tablet Take 1 tablet (150 mg total) by mouth daily.   [DISCONTINUED] amphetamine-dextroamphetamine (ADDERALL XR) 25 MG 24 hr capsule Take 1 capsule by mouth every morning.   No facility-administered encounter medications on file as of 04/16/2022.    Past Medical History:   Diagnosis Date   Acid reflux    ADD (attention deficit disorder)    Allergy    Anemia    Attention deficit disorder (ADD)    Depression    Endometriosis    IBS (irritable bowel syndrome)     Past Surgical History:  Procedure Laterality Date   CESAREAN SECTION  2004, 2008   CESAREAN SECTION     CHOLECYSTECTOMY  02/23/2002   CHOLECYSTECTOMY     LAPAROSCOPY  02/24/2000   endometriosis   TUBAL LIGATION  02/23/2006   TUBAL LIGATION     tubal ligation reversal Right     Family History  Problem Relation Age of Onset   Hypertension Mother    Diabetes Mother    Hypertension Father    Heart disease Father    Diabetes Father    Diabetes Maternal Grandmother    Kidney disease Maternal Grandfather    Kidney disease Maternal Uncle    Colon cancer Neg Hx    Esophageal cancer Neg Hx    Rectal cancer Neg Hx    Stomach cancer Neg Hx     Social History   Socioeconomic History   Marital status: Married    Spouse name: Not on file   Number of children: 3   Years of education: Not on file   Highest education level: Not on file  Occupational History   Occupation: educator   Tobacco Use   Smoking status: Never   Smokeless  tobacco: Never  Vaping Use   Vaping Use: Never used  Substance and Sexual Activity   Alcohol use: Yes    Alcohol/week: 0.0 standard drinks of alcohol    Comment: ocassionally   Drug use: No   Sexual activity: Yes    Partners: Male    Birth control/protection: None  Other Topics Concern   Not on file  Social History Narrative   ** Merged History Encounter **       Social Determinants of Health   Financial Resource Strain: Low Risk  (11/02/2018)   Overall Financial Resource Strain (CARDIA)    Difficulty of Paying Living Expenses: Not hard at all  Food Insecurity: No Food Insecurity (11/02/2018)   Hunger Vital Sign    Worried About Running Out of Food in the Last Year: Never true    [place] in the Last Year: Never true  Transportation Needs:  No Transportation Needs (11/02/2018)   PRAPARE - Hydrologist (Medical): No    Lack of Transportation (Non-Medical): No  Physical Activity: Insufficiently Active (11/02/2018)   Exercise Vital Sign    Days of Exercise per Week: 3 days    Minutes of Exercise per Session: 30 min  Stress: No Stress Concern Present (11/02/2018)   [place]    Feeling of Stress : Only a little  Social Connections: Not on file  Intimate Partner Violence: Not on file    Review of Systems  Constitutional:  Negative for chills and fever.  Gastrointestinal:  Positive for abdominal pain and nausea. Negative for constipation, diarrhea and vomiting.  Genitourinary:  Negative for dysuria, frequency, hematuria and urgency.  All other systems reviewed and are negative.       Objective    BP 109/77   Pulse 100   Temp 98.1 F (36.7 C) (Oral)   Resp 16   Wt 190 lb 9.6 oz (86.5 kg)   SpO2 96%   BMI 37.22 kg/m   Physical Exam Vitals and nursing note reviewed.  Constitutional:      General: She is in acute distress.  Cardiovascular:     Rate and Rhythm: Normal rate and regular rhythm.  Pulmonary:     Effort: Pulmonary effort is normal.     Breath sounds: Normal breath sounds.  Abdominal:     General: There is no distension.     Palpations: Abdomen is soft. There is no mass.     Tenderness: There is abdominal tenderness (suprapubic). There is no guarding or rebound.     Hernia: No hernia is present.  Neurological:     General: No focal deficit present.     Mental Status: She is alert and oriented to person, place, and time.         Assessment & Plan:  1. Lower abdominal pain Exam  not indicative acute abdomen. Will refer to GYN for further eval/mgt. If sx persist/worsen, recommend referral to ED for further eval/mgt. Patient v.u.  2. Menorrhagia with irregular cycle Referral to GYN for further  eval/mgt  3. Dysthymia Appears stable. Meds refilled.   4. Attention deficit hyperactivity disorder (ADHD), combined type Doing well with present management but has not been able to get her '5mg'$  short acting dose approved as of yet   Return in about 4 weeks (around 05/14/2022) for follow up.   Becky Sax, MD

## 2022-04-17 ENCOUNTER — Encounter: Payer: Self-pay | Admitting: Family Medicine

## 2022-04-21 ENCOUNTER — Ambulatory Visit: Payer: BC Managed Care – PPO | Admitting: Obstetrics

## 2022-04-21 ENCOUNTER — Encounter: Payer: Self-pay | Admitting: Obstetrics

## 2022-04-21 ENCOUNTER — Other Ambulatory Visit (HOSPITAL_COMMUNITY)
Admission: RE | Admit: 2022-04-21 | Discharge: 2022-04-21 | Disposition: A | Discharge status: Home / Self Care | Payer: BC Managed Care – PPO | Source: Ambulatory Visit | Attending: Obstetrics | Admitting: Obstetrics

## 2022-04-21 VITALS — BP 114/72 | HR 101 | Ht 59.75 in | Wt 188.4 lb

## 2022-04-21 DIAGNOSIS — E669 Obesity, unspecified: Secondary | ICD-10-CM

## 2022-04-21 DIAGNOSIS — N939 Abnormal uterine and vaginal bleeding, unspecified: Secondary | ICD-10-CM

## 2022-04-21 DIAGNOSIS — N898 Other specified noninflammatory disorders of vagina: Secondary | ICD-10-CM

## 2022-04-21 DIAGNOSIS — D500 Iron deficiency anemia secondary to blood loss (chronic): Secondary | ICD-10-CM

## 2022-04-21 DIAGNOSIS — R9389 Abnormal findings on diagnostic imaging of other specified body structures: Secondary | ICD-10-CM

## 2022-04-21 DIAGNOSIS — N946 Dysmenorrhea, unspecified: Secondary | ICD-10-CM | POA: Diagnosis not present

## 2022-04-21 MED ORDER — ACCRUFER 30 MG PO CAPS: 1.0000 | ORAL_CAPSULE | Freq: Two times a day (BID) | ORAL | 3 refills | Status: DC

## 2022-04-21 MED ORDER — IBUPROFEN 800 MG PO TABS: 800.0000 mg | ORAL_TABLET | Freq: Three times a day (TID) | ORAL | 5 refills | Status: DC | PRN

## 2022-04-21 MED ORDER — MEGESTROL ACETATE 40 MG PO TABS: 80.0000 mg | ORAL_TABLET | Freq: Two times a day (BID) | ORAL | 1 refills | Status: DC

## 2022-04-21 NOTE — Progress Notes (Signed)
Patient ID: Aimee King, female   DOB: 1980-12-20, 42 y.o.   MRN: [id number]  HPI Aimee King is a 42 y.o. female.  Complains of heavy and painful periods.  Ultrasound revealed a thickened endometrium of 24 mm.  Endometrial biopsy revealed normal endometriums.  She presents today for follow up.  Past OB history of 3 full term deliveries and a BTL.  She had a tubal reanastomosis done later.  She states that she desires future fertility.  She currently has a new partner which she has not been able to conceive with. HPI Past Medical History:  Diagnosis Date   Acid reflux    ADD (attention deficit disorder)    Allergy    Anemia    Attention deficit disorder (ADD)    Depression    Endometriosis    IBS (irritable bowel syndrome)     Past Surgical History:  Procedure Laterality Date   CESAREAN SECTION  2004, 2008   CESAREAN SECTION     CHOLECYSTECTOMY  02/23/2002   CHOLECYSTECTOMY     LAPAROSCOPY  02/24/2000   endometriosis   TUBAL LIGATION  02/23/2006   TUBAL LIGATION     tubal ligation reversal Right     Family History  Problem Relation Age of Onset   Hypertension Mother    Diabetes Mother    Hypertension Father    Heart disease Father    Diabetes Father    Diabetes Maternal Grandmother    Kidney disease Maternal Grandfather    Kidney disease Maternal Uncle    Colon cancer Neg Hx    Esophageal cancer Neg Hx    Rectal cancer Neg Hx    Stomach cancer Neg Hx     Social History Social History   Tobacco Use   Smoking status: Never   Smokeless tobacco: Never  Vaping Use   Vaping Use: Never used  Substance Use Topics   Alcohol use: Yes    Alcohol/week: 0.0 standard drinks of alcohol    Comment: ocassionally   Drug use: No    Allergies  Allergen Reactions   Latex Swelling   Codeine Nausea And Vomiting   Codeine Nausea And Vomiting   Latex     Feels like little cuts in her vagina.    Current Outpatient Medications  Medication Sig  Dispense Refill   albuterol (VENTOLIN HFA) 108 (90 Base) MCG/ACT inhaler Inhale into the lungs every 6 (six) hours as needed for wheezing or shortness of breath.     amphetamine-dextroamphetamine (ADDERALL XR) 30 MG 24 hr capsule Take 1 capsule (30 mg total) by mouth every morning. 30 capsule 0   amphetamine-dextroamphetamine (ADDERALL) 5 MG tablet Take 1 tablet (5 mg total) by mouth daily. 30 tablet 0   buPROPion (WELLBUTRIN XL) 150 MG 24 hr tablet Take 1 tablet (150 mg total) by mouth daily. 90 tablet 0   Ferric Maltol (ACCRUFER) 30 MG CAPS Take 1 capsule (30 mg total) by mouth 2 (two) times daily before a meal. Take 2 hrs before, or 2 hrs after a meal. 60 capsule 3   fexofenadine (ALLEGRA) 180 MG tablet Take 180 mg by mouth daily.     megestrol (MEGACE) 40 MG tablet Take 2 tablets (80 mg total) by mouth 2 (two) times daily. 120 tablet 1   No current facility-administered medications for this visit.    Review of Systems Review of Systems Constitutional: negative for fatigue and weight loss Respiratory: negative for cough and wheezing Cardiovascular: negative for chest  pain, fatigue and palpitations Gastrointestinal: negative for abdominal pain and change in bowel habits Genitourinary: positive for heavy and painful periods Integument/breast: negative for nipple discharge Musculoskeletal:negative for myalgias Neurological: negative for gait problems and tremors Behavioral/Psych: negative for abusive relationship, depression Endocrine: negative for temperature intolerance      Blood pressure 114/72, pulse (!) 101, height 4' 11.75" (1.518 m), weight 188 lb 6.4 oz (85.5 kg), last menstrual period 04/12/2022.  Physical Exam Physical Exam General:   Alert and no distress  Skin:   no rash or abnormalities  Lungs:   clear to auscultation bilaterally  Heart:   regular rate and rhythm, S1, S2 normal, no murmur, click, rub or gallop  Breasts:   normal without suspicious masses, skin or nipple  changes or axillary nodes  Abdomen:  normal findings: no organomegaly, soft, non-tender and no hernia  Pelvis:  External genitalia: normal general appearance Urinary system: urethral meatus normal and bladder without fullness, nontender Vaginal: normal without tenderness, induration or masses Cervix: normal appearance Adnexa: normal bimanual exam Uterus: anteverted and non-tender, normal size    I have spent a total of 20 minutes of face-to-face time, excluding clinical staff time, reviewing notes and preparing to see patient, ordering tests and/or medications, and counseling the patient.   Data Reviewed Ultrasound Endometrial Biopsy CBC    US PELVIC COMPLETE WITH TRANSVAGINAL (Accession ID:[id number]) (Order [id number]) Imaging Date: 07/18/2021 Department: [facility] Released By: Army [place] Authorizing: Shelly Bombard, MD   Exam Status  Status  Final [99]   PACS Intelerad Image Link   Show images for US PELVIC COMPLETE WITH TRANSVAGINAL Study Result  Narrative & Impression  CLINICAL DATA:  Abnormal uterine bleeding, last menstrual cycle lasted 24 days duration, LMP 06/16/2021   EXAM: TRANSABDOMINAL AND TRANSVAGINAL ULTRASOUND OF PELVIS   TECHNIQUE: Both transabdominal and transvaginal ultrasound examinations of the pelvis were performed. Transabdominal technique was performed for global imaging of the pelvis including uterus, ovaries, adnexal regions, and pelvic cul-de-sac. It was necessary to proceed with endovaginal exam following the transabdominal exam to visualize the ovaries.   COMPARISON:  05/23/2009   FINDINGS: Uterus   Measurements: 10.1 x 5.0 x 6.0 cm = volume: 162 mL. Anteverted. Anterior wall Caesarean section scar. Heterogeneous myometrium. No focal mass.   Endometrium   Thickness: 24 mm. Thickened, slightly heterogeneous, ill-defined. No discrete mass or fluid.   Right ovary   Measurements: 2.5 x 2.2 x 1.7  cm = volume: 5.0 mL. Normal morphology without mass   Left ovary   Measurements: 1.9 x 2.0 x 1.6 cm = volume: 3 mL. Normal morphology without mass   Other findings   No free pelvic fluid or adnexal masses.   IMPRESSION: Thickened endometrial complex 24 mm thick; if bleeding remains unresponsive to hormonal or medical therapy, focal lesion work-up with sonohysterogram should be considered. Endometrial biopsy should also be considered in pre-menopausal patients at high risk for endometrial carcinoma. (Ref: Radiological Reasoning: Algorithmic Workup of Abnormal Vaginal Bleeding with Endovaginal Sonography and Sonohysterography. AJR 2008; [id number])   Remainder of exam unremarkable.     Electronically Signed   By: Lavonia Dana M.D.   On: 07/18/2021 17:11       Assessment     1. Abnormal uterine bleeding (AUB) Rx: - CBC - Ferritin  2. Iron deficiency anemia due to chronic blood loss Rx: - Ferric Maltol (ACCRUFER) 30 MG CAPS; Take 1 capsule (30 mg total) by mouth 2 (two) times daily  before a meal. Take 2 hrs before, or 2 hrs after a meal.  Dispense: 60 capsule; Refill: 3  3. Endometrial thickening on ultrasound Rx: - megestrol (MEGACE) 40 MG tablet; Take 2 tablets (80 mg total) by mouth 2 (two) times daily.  Dispense: 120 tablet; Refill: 1  4. Severe dysmenorrhea Rx: - ibuprofen (ADVIL) 800 MG tablet; Take 1 tablet (800 mg total) by mouth every 8 (eight) hours as needed.  Dispense: 30 tablet; Refill: 5  5. Vaginal discharge Rx: - Cervicovaginal ancillary only( [place])  6. Obesity (BMI 35.0-39.9 without comorbidity) - weight reduction recommended     Plan   Follow up in 3 weeks  Orders Placed This Encounter  Procedures   CBC   Ferritin   Meds ordered this encounter  Medications   Ferric Maltol (ACCRUFER) 30 MG CAPS    Sig: Take 1 capsule (30 mg total) by mouth 2 (two) times daily before a meal. Take 2 hrs before, or 2 hrs after a meal.    Dispense:   60 capsule    Refill:  3   megestrol (MEGACE) 40 MG tablet    Sig: Take 2 tablets (80 mg total) by mouth 2 (two) times daily.    Dispense:  120 tablet    Refill:  1     Shelly Bombard, MD 04/21/2022 2:41 PM

## 2022-04-21 NOTE — Progress Notes (Signed)
Pt presents for pelvic pain. Pt c/o pelvic pain and heavy irregular periods. Does not desire BC. Using adult diapers while on period. Pt trying to conceive.

## 2022-04-22 ENCOUNTER — Other Ambulatory Visit: Payer: Self-pay | Admitting: Obstetrics

## 2022-04-22 LAB — CBC
Hematocrit: 25.3 % — ABNORMAL LOW (ref 34.0–46.6)
Hemoglobin: 7.3 g/dL — ABNORMAL LOW (ref 11.1–15.9)
MCH: 21.7 pg — ABNORMAL LOW (ref 26.6–33.0)
MCHC: 28.9 g/dL — ABNORMAL LOW (ref 31.5–35.7)
MCV: 75 fL — ABNORMAL LOW (ref 79–97)
Platelets: 340 10*3/uL (ref 150–450)
RBC: 3.37 x10E6/uL — ABNORMAL LOW (ref 3.77–5.28)
RDW: 15.4 % (ref 11.7–15.4)
WBC: 7.3 10*3/uL (ref 3.4–10.8)

## 2022-04-22 LAB — FERRITIN: Ferritin: 5 ng/mL — ABNORMAL LOW (ref 15–150)

## 2022-04-23 ENCOUNTER — Other Ambulatory Visit: Payer: Self-pay | Admitting: Obstetrics

## 2022-04-23 ENCOUNTER — Telehealth: Payer: Self-pay

## 2022-04-23 DIAGNOSIS — N760 Acute vaginitis: Secondary | ICD-10-CM

## 2022-04-23 LAB — CERVICOVAGINAL ANCILLARY ONLY
Bacterial Vaginitis (gardnerella): POSITIVE — AB
Candida Glabrata: NEGATIVE
Candida Vaginitis: NEGATIVE
Chlamydia: NEGATIVE
Comment: NEGATIVE
Comment: NEGATIVE
Comment: NEGATIVE
Comment: NEGATIVE
Comment: NEGATIVE
Comment: NORMAL
Neisseria Gonorrhea: NEGATIVE
Trichomonas: NEGATIVE

## 2022-04-23 MED ORDER — METRONIDAZOLE 500 MG PO TABS: 500.0000 mg | ORAL_TABLET | Freq: Two times a day (BID) | ORAL | 2 refills | Status: DC

## 2022-04-23 NOTE — Telephone Encounter (Signed)
S/w pt and advised of results and rx

## 2022-04-26 ENCOUNTER — Other Ambulatory Visit: Payer: Self-pay | Admitting: Family Medicine

## 2022-04-30 MED ORDER — AMPHETAMINE-DEXTROAMPHETAMINE 5 MG PO TABS: 5.0000 mg | ORAL_TABLET | Freq: Every day | ORAL | 0 refills | Status: DC

## 2022-04-30 MED ORDER — FEXOFENADINE HCL 180 MG PO TABS: 180.0000 mg | ORAL_TABLET | Freq: Every day | ORAL | 2 refills | Status: DC

## 2022-04-30 MED ORDER — AMPHETAMINE-DEXTROAMPHET ER 30 MG PO CP24: 30.0000 mg | ORAL_CAPSULE | ORAL | 0 refills | Status: DC

## 2022-05-13 ENCOUNTER — Other Ambulatory Visit: Payer: Self-pay | Admitting: Obstetrics

## 2022-05-13 DIAGNOSIS — R9389 Abnormal findings on diagnostic imaging of other specified body structures: Secondary | ICD-10-CM

## 2022-05-18 ENCOUNTER — Ambulatory Visit: Payer: BC Managed Care – PPO | Admitting: Family Medicine

## 2022-05-27 ENCOUNTER — Ambulatory Visit: Payer: BC Managed Care – PPO | Admitting: Obstetrics and Gynecology

## 2022-05-27 ENCOUNTER — Encounter: Payer: Self-pay | Admitting: Obstetrics and Gynecology

## 2022-05-27 ENCOUNTER — Other Ambulatory Visit (HOSPITAL_COMMUNITY)
Admission: RE | Admit: 2022-05-27 | Discharge: 2022-05-27 | Disposition: A | Discharge status: Home / Self Care | Payer: BC Managed Care – PPO | Source: Ambulatory Visit | Attending: Obstetrics and Gynecology | Admitting: Obstetrics and Gynecology

## 2022-05-27 VITALS — BP 125/87 | HR 109 | Ht 59.0 in | Wt 187.7 lb

## 2022-05-27 DIAGNOSIS — R9389 Abnormal findings on diagnostic imaging of other specified body structures: Secondary | ICD-10-CM | POA: Insufficient documentation

## 2022-05-27 DIAGNOSIS — Z319 Encounter for procreative management, unspecified: Secondary | ICD-10-CM | POA: Insufficient documentation

## 2022-05-27 NOTE — Progress Notes (Unsigned)
Pt with thicken endometrium on U/S Desires pregnancy H/O tubal reversal Megace qod is controlling bleeding  ENDOMETRIAL BIOPSY     The indications for endometrial biopsy were reviewed.   Risks of the biopsy including cramping, bleeding, infection, uterine perforation, inadequate specimen and need for additional procedures  were discussed. The patient states she understands and agrees to undergo procedure today. Consent was signed. Time out was performed. Urine HCG was negative. During the pelvic exam, the cervix was prepped with Betadine. A single-toothed tenaculum was placed on the anterior lip of the cervix to stabilize it. The 3 mm pipelle was introduced into the endometrial cavity without difficulty to a depth of 9 cm, and a moderate amount of tissue was obtained and sent to pathology. The instruments were removed from the patient's vagina. Minimal bleeding from the cervix was noted. The patient tolerated the procedure well. Routine post-procedure instructions were given to the patient.      Will refer to REI for further eval of desire for pregnancy Additional f/u as per test results

## 2022-05-27 NOTE — Progress Notes (Unsigned)
Pt presents to discuss AUB F/U. Taking megace, reports lighter bleeding.  Desires pregnancy

## 2022-05-28 ENCOUNTER — Encounter (INDEPENDENT_AMBULATORY_CARE_PROVIDER_SITE_OTHER): Payer: Self-pay

## 2022-05-29 LAB — SURGICAL PATHOLOGY

## 2022-06-06 ENCOUNTER — Other Ambulatory Visit: Payer: Self-pay | Admitting: Family Medicine

## 2022-06-10 MED ORDER — AMPHETAMINE-DEXTROAMPHETAMINE 5 MG PO TABS: 5.0000 mg | ORAL_TABLET | Freq: Every day | ORAL | 0 refills | Status: DC

## 2022-06-10 MED ORDER — AMPHETAMINE-DEXTROAMPHET ER 30 MG PO CP24: 30.0000 mg | ORAL_CAPSULE | ORAL | 0 refills | Status: DC

## 2022-06-11 ENCOUNTER — Other Ambulatory Visit: Payer: Self-pay | Admitting: Obstetrics

## 2022-06-11 DIAGNOSIS — R9389 Abnormal findings on diagnostic imaging of other specified body structures: Secondary | ICD-10-CM

## 2022-07-12 ENCOUNTER — Other Ambulatory Visit: Payer: Self-pay | Admitting: Family Medicine

## 2022-07-13 ENCOUNTER — Other Ambulatory Visit: Payer: Self-pay | Admitting: Family Medicine

## 2022-07-13 NOTE — Telephone Encounter (Signed)
Requested medication (s) are due for refill today - yes  Requested medication (s) are on the active medication list -yes  Future visit scheduled -yes  Last refill: 04/16/22 #90  Notes to clinic: fails lab protocol- over 1 year   Requested Prescriptions  Pending Prescriptions Disp Refills   buPROPion (WELLBUTRIN XL) 150 MG 24 hr tablet [Pharmacy Med Name: BUPROPION HCL XL 150 MG TABLET] 90 tablet 0    Sig: TAKE 1 TABLET BY MOUTH EVERY DAY     Psychiatry: Antidepressants - bupropion Failed - 07/12/2022  1:12 AM      Failed - Cr in normal range and within 360 days    Creat  Date Value Ref Range Status  11/12/2014 0.70 0.50 - 1.10 mg/dL Final   Creatinine, Ser  Date Value Ref Range Status  07/07/2021 0.66 0.44 - 1.00 mg/dL Final         Failed - AST in normal range and within 360 days    AST  Date Value Ref Range Status  06/01/2021 15 15 - 41 U/L Final         Failed - ALT in normal range and within 360 days    ALT  Date Value Ref Range Status  06/01/2021 14 0 - 44 U/L Final         Passed - Last BP in normal range    BP Readings from Last 1 Encounters:  05/27/22 125/87         Passed - Valid encounter within last 6 months    Recent Outpatient Visits           2 months ago Lower abdominal pain   [facility], MD   4 months ago Attention deficit hyperactivity disorder (ADHD), combined type   [facility], MD   8 months ago Attention deficit hyperactivity disorder (ADHD), combined type   [facility], MD   11 months ago Attention deficit hyperactivity disorder (ADHD), combined type   [facility] [facility], MD   1 year ago Attention deficit hyperactivity disorder (ADHD), combined type   [facility], MD                 Requested Prescriptions   Pending Prescriptions Disp Refills   buPROPion (WELLBUTRIN XL) 150 MG 24 hr tablet [Pharmacy Med Name: BUPROPION HCL XL 150 MG TABLET] 90 tablet 0    Sig: TAKE 1 TABLET BY MOUTH EVERY DAY     Psychiatry: Antidepressants - bupropion Failed - 07/12/2022  1:12 AM      Failed - Cr in normal range and within 360 days    Creat  Date Value Ref Range Status  11/12/2014 0.70 0.50 - 1.10 mg/dL Final   Creatinine, Ser  Date Value Ref Range Status  07/07/2021 0.66 0.44 - 1.00 mg/dL Final         Failed - AST in normal range and within 360 days    AST  Date Value Ref Range Status  06/01/2021 15 15 - 41 U/L Final         Failed - ALT in normal range and within 360 days    ALT  Date Value Ref Range Status  06/01/2021 14 0 - 44 U/L Final         Passed - Last BP in  normal range    BP Readings from Last 1 Encounters:  05/27/22 125/87         Passed - Valid encounter within last 6 months    Recent Outpatient Visits           2 months ago Lower abdominal pain   [facility], MD   4 months ago Attention deficit hyperactivity disorder (ADHD), combined type   [place] [facility], MD   8 months ago Attention deficit hyperactivity disorder (ADHD), combined type   [facility] [facility], MD   11 months ago Attention deficit hyperactivity disorder (ADHD), combined type   [place] [facility], MD   1 year ago Attention deficit hyperactivity disorder (ADHD), combined type   [place] [facility], MD

## 2022-07-15 MED ORDER — AMPHETAMINE-DEXTROAMPHETAMINE 5 MG PO TABS: 5.0000 mg | ORAL_TABLET | Freq: Every day | ORAL | 0 refills | Status: DC

## 2022-07-15 MED ORDER — AMPHETAMINE-DEXTROAMPHET ER 30 MG PO CP24: 30.0000 mg | ORAL_CAPSULE | ORAL | 0 refills | Status: DC

## 2022-07-29 ENCOUNTER — Encounter: Payer: Self-pay | Admitting: Family Medicine

## 2022-08-05 ENCOUNTER — Other Ambulatory Visit: Payer: Self-pay | Admitting: Family Medicine

## 2022-08-17 ENCOUNTER — Other Ambulatory Visit: Payer: Self-pay | Admitting: Family Medicine

## 2022-08-19 ENCOUNTER — Ambulatory Visit: Payer: BC Managed Care – PPO | Admitting: Family Medicine

## 2022-08-19 ENCOUNTER — Encounter: Payer: Self-pay | Admitting: Family Medicine

## 2022-08-19 VITALS — BP 119/81 | HR 98 | Temp 98.1°F | Resp 16

## 2022-08-19 DIAGNOSIS — Z862 Personal history of diseases of the blood and blood-forming organs and certain disorders involving the immune mechanism: Secondary | ICD-10-CM

## 2022-08-19 DIAGNOSIS — R7303 Prediabetes: Secondary | ICD-10-CM

## 2022-08-19 DIAGNOSIS — E119 Type 2 diabetes mellitus without complications: Secondary | ICD-10-CM

## 2022-08-19 DIAGNOSIS — Z6836 Body mass index (BMI) 36.0-36.9, adult: Secondary | ICD-10-CM

## 2022-08-19 DIAGNOSIS — E6609 Other obesity due to excess calories: Secondary | ICD-10-CM

## 2022-08-19 DIAGNOSIS — Z7984 Long term (current) use of oral hypoglycemic drugs: Secondary | ICD-10-CM

## 2022-08-19 LAB — POCT GLYCOSYLATED HEMOGLOBIN (HGB A1C): Hemoglobin A1C: 7.8 % — AB (ref 4.0–5.6)

## 2022-08-19 NOTE — Progress Notes (Signed)
Patient is here for their 3 month follow-up Patient has no concerns today Care gaps have been discussed with patient  

## 2022-08-20 ENCOUNTER — Encounter: Payer: Self-pay | Admitting: Family Medicine

## 2022-08-20 LAB — CBC WITH DIFFERENTIAL/PLATELET
Basophils Absolute: 0 10*3/uL (ref 0.0–0.2)
Basos: 1 %
EOS (ABSOLUTE): 0.1 10*3/uL (ref 0.0–0.4)
Eos: 1 %
Hematocrit: 39.3 % (ref 34.0–46.6)
Hemoglobin: 11.5 g/dL (ref 11.1–15.9)
Immature Grans (Abs): 0 10*3/uL (ref 0.0–0.1)
Immature Granulocytes: 0 %
Lymphocytes Absolute: 3.5 10*3/uL — ABNORMAL HIGH (ref 0.7–3.1)
Lymphs: 43 %
MCH: 22.1 pg — ABNORMAL LOW (ref 26.6–33.0)
MCHC: 29.3 g/dL — ABNORMAL LOW (ref 31.5–35.7)
MCV: 75 fL — ABNORMAL LOW (ref 79–97)
Monocytes Absolute: 0.8 10*3/uL (ref 0.1–0.9)
Monocytes: 9 %
Neutrophils Absolute: 3.9 10*3/uL (ref 1.4–7.0)
Neutrophils: 46 %
Platelets: 326 10*3/uL (ref 150–450)
RBC: 5.21 x10E6/uL (ref 3.77–5.28)
RDW: 18.4 % — ABNORMAL HIGH (ref 11.7–15.4)
WBC: 8.2 10*3/uL (ref 3.4–10.8)

## 2022-08-21 ENCOUNTER — Other Ambulatory Visit: Payer: Self-pay | Admitting: *Deleted

## 2022-08-21 DIAGNOSIS — D500 Iron deficiency anemia secondary to blood loss (chronic): Secondary | ICD-10-CM

## 2022-08-21 MED ORDER — ACCRUFER 30 MG PO CAPS
1.0000 | ORAL_CAPSULE | Freq: Two times a day (BID) | ORAL | 0 refills | Status: DC
Start: 2022-08-21 — End: 2022-09-21

## 2022-08-21 NOTE — Progress Notes (Signed)
RX Accrufer renewed. 

## 2022-08-21 NOTE — Telephone Encounter (Signed)
Patient concerns

## 2022-08-21 NOTE — Telephone Encounter (Signed)
Sent to scheduler to call patient

## 2022-08-24 ENCOUNTER — Encounter: Payer: Self-pay | Admitting: Family Medicine

## 2022-08-24 ENCOUNTER — Ambulatory Visit: Payer: BC Managed Care – PPO | Admitting: Family Medicine

## 2022-08-24 VITALS — BP 111/75 | HR 104 | Temp 98.1°F | Resp 16 | Wt 187.0 lb

## 2022-08-24 DIAGNOSIS — E119 Type 2 diabetes mellitus without complications: Secondary | ICD-10-CM | POA: Diagnosis not present

## 2022-08-24 DIAGNOSIS — Z7984 Long term (current) use of oral hypoglycemic drugs: Secondary | ICD-10-CM

## 2022-08-24 DIAGNOSIS — Z6837 Body mass index (BMI) 37.0-37.9, adult: Secondary | ICD-10-CM | POA: Diagnosis not present

## 2022-08-24 DIAGNOSIS — Z862 Personal history of diseases of the blood and blood-forming organs and certain disorders involving the immune mechanism: Secondary | ICD-10-CM

## 2022-08-24 DIAGNOSIS — F902 Attention-deficit hyperactivity disorder, combined type: Secondary | ICD-10-CM

## 2022-08-24 DIAGNOSIS — E6609 Other obesity due to excess calories: Secondary | ICD-10-CM | POA: Diagnosis not present

## 2022-08-24 MED ORDER — GLIPIZIDE 5 MG PO TABS: 5.0000 mg | ORAL_TABLET | Freq: Two times a day (BID) | ORAL | 0 refills | Status: DC

## 2022-08-24 MED ORDER — BUPROPION HCL ER (XL) 150 MG PO TB24: 150.0000 mg | ORAL_TABLET | Freq: Every day | ORAL | 0 refills | Status: DC

## 2022-08-24 MED ORDER — AMPHETAMINE-DEXTROAMPHETAMINE 5 MG PO TABS: 5.0000 mg | ORAL_TABLET | Freq: Every day | ORAL | 0 refills | Status: DC

## 2022-08-24 MED ORDER — METFORMIN HCL ER 750 MG PO TB24: 750.0000 mg | ORAL_TABLET | Freq: Every day | ORAL | 0 refills | Status: DC

## 2022-08-24 MED ORDER — AMPHETAMINE-DEXTROAMPHET ER 30 MG PO CP24: 30.0000 mg | ORAL_CAPSULE | ORAL | 0 refills | Status: DC

## 2022-08-24 NOTE — Progress Notes (Signed)
Established Patient Office Visit  Subjective    Patient ID: Aimee King, female    DOB: May 13, 1980  Age: 42 y.o. MRN: [id number]  CC:  Chief Complaint  Patient presents with   Follow-up    HPI Aimee King presents for follow up of  abnormal A1c which now is elevated to level indicating diabetes.    Outpatient Encounter Medications as of 08/19/2022  Medication Sig   albuterol (VENTOLIN HFA) 108 (90 Base) MCG/ACT inhaler Inhale into the lungs every 6 (six) hours as needed for wheezing or shortness of breath.   fexofenadine (ALLEGRA) 180 MG tablet TAKE 1 TABLET BY MOUTH EVERY DAY   ibuprofen (ADVIL) 800 MG tablet Take 1 tablet (800 mg total) by mouth every 8 (eight) hours as needed.   megestrol (MEGACE) 40 MG tablet TAKE 2 TABLETS BY MOUTH TWICE A DAY   [DISCONTINUED] amphetamine-dextroamphetamine (ADDERALL XR) 30 MG 24 hr capsule Take 1 capsule (30 mg total) by mouth every morning.   [DISCONTINUED] amphetamine-dextroamphetamine (ADDERALL) 5 MG tablet Take 1 tablet (5 mg total) by mouth daily.   [DISCONTINUED] buPROPion (WELLBUTRIN XL) 150 MG 24 hr tablet Take 1 tablet (150 mg total) by mouth daily.   [DISCONTINUED] Ferric Maltol (ACCRUFER) 30 MG CAPS Take 1 capsule (30 mg total) by mouth 2 (two) times daily before a meal. Take 2 hrs before, or 2 hrs after a meal.   ALPRAZolam (XANAX) 0.25 MG tablet    No facility-administered encounter medications on file as of 08/19/2022.    Past Medical History:  Diagnosis Date   Acid reflux    ADD (attention deficit disorder)    Allergy    Anemia    Attention deficit disorder (ADD)    Depression    Endometriosis    IBS (irritable bowel syndrome)     Past Surgical History:  Procedure Laterality Date   CESAREAN SECTION  2004, 2008   CESAREAN SECTION     CHOLECYSTECTOMY  02/23/2002   CHOLECYSTECTOMY     LAPAROSCOPY  02/24/2000   endometriosis   TUBAL LIGATION  02/23/2006   TUBAL LIGATION     tubal ligation  reversal Right     Family History  Problem Relation Age of Onset   Hypertension Mother    Diabetes Mother    Hypertension Father    Heart disease Father    Diabetes Father    Diabetes Maternal Grandmother    Kidney disease Maternal Grandfather    Kidney disease Maternal Uncle    Colon cancer Neg Hx    Esophageal cancer Neg Hx    Rectal cancer Neg Hx    Stomach cancer Neg Hx     Social History   Socioeconomic History   Marital status: Married    Spouse name: Not on file   Number of children: 3   Years of education: Not on file   Highest education level: Master's degree (e.g., MA, MS, MEng, MEd, MSW, MBA)  Occupational History   Occupation: educator   Tobacco Use   Smoking status: Never   Smokeless tobacco: Never  Vaping Use   Vaping Use: Never used  Substance and Sexual Activity   Alcohol use: Yes    Alcohol/week: 0.0 standard drinks of alcohol    Comment: ocassionally   Drug use: No   Sexual activity: Yes    Partners: Male    Birth control/protection: None  Other Topics Concern   Not on file  Social History Narrative   ** Merged  History Encounter **       Social Determinants of Health   Financial Resource Strain: Medium Risk (05/14/2022)   Overall Financial Resource Strain (CARDIA)    Difficulty of Paying Living Expenses: Somewhat hard  Food Insecurity: Food Insecurity Present (05/14/2022)   Hunger Vital Sign    Worried About Running Out of Food in the Last Year: Sometimes true    Ran Out of Food in the Last Year: Never true  Transportation Needs: No Transportation Needs (05/14/2022)   PRAPARE - Administrator, Civil Service (Medical): No    Lack of Transportation (Non-Medical): No  Physical Activity: Unknown (05/14/2022)   Exercise Vital Sign    Days of Exercise per Week: 0 days    Minutes of Exercise per Session: Not on file  Stress: No Stress Concern Present (05/14/2022)   [employer] of Occupational Health - Occupational Stress  Questionnaire    Feeling of Stress : Only a little  Social Connections: Socially Integrated (05/14/2022)   Social Connection and Isolation Panel [NHANES]    Frequency of Communication with Friends and Family: More than three times a week    Frequency of Social Gatherings with Friends and Family: Once a week    Attends Religious Services: More than 4 times per year    Active Member of [employer] or Organizations: Yes    Attends Engineer, structural: More than 4 times per year    Marital Status: Married  Catering manager Violence: Not on file    Review of Systems  All other systems reviewed and are negative.       Objective    BP 119/81   Pulse 98   Temp 98.1 F (36.7 C) (Oral)   Resp 16   SpO2 98%   Physical Exam Vitals and nursing note reviewed.  Constitutional:      General: She is not in acute distress. Cardiovascular:     Rate and Rhythm: Normal rate and regular rhythm.  Pulmonary:     Effort: Pulmonary effort is normal.     Breath sounds: Normal breath sounds.  Abdominal:     Palpations: Abdomen is soft.     Tenderness: There is no abdominal tenderness.  Neurological:     General: No focal deficit present.     Mental Status: She is alert and oriented to person, place, and time.         Assessment & Plan:  1. New onset type 2 diabetes mellitus (HCC) Results pending - Hemoglobin A1c - POCT glycosylated hemoglobin (Hb A1C)  2. Class 2 obesity due to excess calories without serious comorbidity with body mass index (BMI) of 36.0 to 36.9 in adult Discussed dietary and activity options.  - Hemoglobin A1c  3. History of anemia  - CBC with Differential   No follow-ups on file.   Aimee Raymond, MD

## 2022-08-24 NOTE — Progress Notes (Signed)
Patient is here to talk about her lab results with provider.

## 2022-08-26 ENCOUNTER — Encounter: Payer: Self-pay | Admitting: Family Medicine

## 2022-08-26 NOTE — Progress Notes (Signed)
Established Patient Office Visit  Subjective    Patient ID: Aimee King, female    DOB: 09/01/1980  Age: 42 y.o. MRN: [id number]  CC: No chief complaint on file.   HPI Aimee King presents for follow up of lab results. Patient denies acute complaints    Outpatient Encounter Medications as of 08/24/2022  Medication Sig   glipiZIDE (GLUCOTROL) 5 MG tablet Take 1 tablet (5 mg total) by mouth 2 (two) times daily before a meal.   metFORMIN (GLUCOPHAGE-XR) 750 MG 24 hr tablet Take 1 tablet (750 mg total) by mouth daily with breakfast.   albuterol (VENTOLIN HFA) 108 (90 Base) MCG/ACT inhaler Inhale into the lungs every 6 (six) hours as needed for wheezing or shortness of breath.   ALPRAZolam (XANAX) 0.25 MG tablet    amphetamine-dextroamphetamine (ADDERALL XR) 30 MG 24 hr capsule Take 1 capsule (30 mg total) by mouth every morning.   amphetamine-dextroamphetamine (ADDERALL) 5 MG tablet Take 1 tablet (5 mg total) by mouth daily.   buPROPion (WELLBUTRIN XL) 150 MG 24 hr tablet Take 1 tablet (150 mg total) by mouth daily.   Ferric Maltol (ACCRUFER) 30 MG CAPS Take 1 capsule (30 mg total) by mouth 2 (two) times daily before a meal. Take 2 hrs before, or 2 hrs after a meal.   fexofenadine (ALLEGRA) 180 MG tablet TAKE 1 TABLET BY MOUTH EVERY DAY   ibuprofen (ADVIL) 800 MG tablet Take 1 tablet (800 mg total) by mouth every 8 (eight) hours as needed.   megestrol (MEGACE) 40 MG tablet TAKE 2 TABLETS BY MOUTH TWICE A DAY   [DISCONTINUED] amphetamine-dextroamphetamine (ADDERALL XR) 30 MG 24 hr capsule Take 1 capsule (30 mg total) by mouth every morning.   [DISCONTINUED] amphetamine-dextroamphetamine (ADDERALL) 5 MG tablet Take 1 tablet (5 mg total) by mouth daily.   [DISCONTINUED] buPROPion (WELLBUTRIN XL) 150 MG 24 hr tablet TAKE 1 TABLET BY MOUTH EVERY DAY   No facility-administered encounter medications on file as of 08/24/2022.    Past Medical History:  Diagnosis Date    Acid reflux    ADD (attention deficit disorder)    Allergy    Anemia    Attention deficit disorder (ADD)    Depression    Endometriosis    IBS (irritable bowel syndrome)     Past Surgical History:  Procedure Laterality Date   CESAREAN SECTION  2004, 2008   CESAREAN SECTION     CHOLECYSTECTOMY  02/23/2002   CHOLECYSTECTOMY     LAPAROSCOPY  02/24/2000   endometriosis   TUBAL LIGATION  02/23/2006   TUBAL LIGATION     tubal ligation reversal Right     Family History  Problem Relation Age of Onset   Hypertension Mother    Diabetes Mother    Hypertension Father    Heart disease Father    Diabetes Father    Diabetes Maternal Grandmother    Kidney disease Maternal Grandfather    Kidney disease Maternal Uncle    Colon cancer Neg Hx    Esophageal cancer Neg Hx    Rectal cancer Neg Hx    Stomach cancer Neg Hx     Social History   Socioeconomic History   Marital status: Married    Spouse name: Not on file   Number of children: 3   Years of education: Not on file   Highest education level: Master's degree (e.g., MA, MS, MEng, MEd, MSW, MBA)  Occupational History   Occupation: Programmer, systems  Tobacco Use   Smoking status: Never   Smokeless tobacco: Never  Vaping Use   Vaping Use: Never used  Substance and Sexual Activity   Alcohol use: Yes    Alcohol/week: 0.0 standard drinks of alcohol    Comment: ocassionally   Drug use: No   Sexual activity: Yes    Partners: Male    Birth control/protection: None  Other Topics Concern   Not on file  Social History Narrative   ** Merged History Encounter **       Social Determinants of Health   Financial Resource Strain: Medium Risk (05/14/2022)   Overall Financial Resource Strain (CARDIA)    Difficulty of Paying Living Expenses: Somewhat hard  Food Insecurity: Food Insecurity Present (05/14/2022)   Hunger Vital Sign    Worried About Running Out of Food in the Last Year: Sometimes true    Ran Out of Food in the Last Year:  Never true  Transportation Needs: No Transportation Needs (05/14/2022)   PRAPARE - Administrator, Civil Service (Medical): No    Lack of Transportation (Non-Medical): No  Physical Activity: Unknown (05/14/2022)   Exercise Vital Sign    Days of Exercise per Week: 0 days    Minutes of Exercise per Session: Not on file  Stress: No Stress Concern Present (05/14/2022)   [employer] of Occupational Health - Occupational Stress Questionnaire    Feeling of Stress : Only a little  Social Connections: Socially Integrated (05/14/2022)   Social Connection and Isolation Panel [NHANES]    Frequency of Communication with Friends and Family: More than three times a week    Frequency of Social Gatherings with Friends and Family: Once a week    Attends Religious Services: More than 4 times per year    Active Member of [employer] or Organizations: Yes    Attends Engineer, structural: More than 4 times per year    Marital Status: Married  Catering manager Violence: Not on file    Review of Systems  All other systems reviewed and are negative.       Objective    BP 111/75   Pulse (!) 104   Temp 98.1 F (36.7 C) (Oral)   Resp 16   Wt 187 lb (84.8 kg)   SpO2 98%   BMI 37.77 kg/m   Physical Exam Vitals and nursing note reviewed.  Constitutional:      General: She is not in acute distress. Cardiovascular:     Rate and Rhythm: Normal rate and regular rhythm.  Pulmonary:     Effort: Pulmonary effort is normal.     Breath sounds: Normal breath sounds.  Abdominal:     Palpations: Abdomen is soft.     Tenderness: There is no abdominal tenderness.  Neurological:     General: No focal deficit present.     Mental Status: She is alert and oriented to person, place, and time.         Assessment & Plan:   1. Type 2 diabetes mellitus without complication, without long-term current use of insulin [facility]) Referral to [facility] for med managemnet. Metformin and glipizide  prescribed  2. Class 2 obesity due to excess calories without serious comorbidity with body mass index (BMI) of 37.0 to 37.9 in adult Discussed dietary and activity options.   3. History of anemia   4. Attention deficit hyperactivity disorder (ADHD), combined type Appears stable. Meds refilled.     No follow-ups on file.  Becky Sax, MD

## 2022-08-28 NOTE — Telephone Encounter (Signed)
Wellbutrin prescribed 08/24/2022 by Georganna Skeans, MD.

## 2022-09-01 NOTE — Telephone Encounter (Signed)
Complete

## 2022-09-21 ENCOUNTER — Other Ambulatory Visit: Payer: Self-pay | Admitting: General Practice

## 2022-09-21 DIAGNOSIS — D500 Iron deficiency anemia secondary to blood loss (chronic): Secondary | ICD-10-CM

## 2022-09-21 MED ORDER — ACCRUFER 30 MG PO CAPS
1.0000 | ORAL_CAPSULE | Freq: Two times a day (BID) | ORAL | 0 refills | Status: DC
Start: 2022-09-21 — End: 2023-03-25

## 2022-10-06 ENCOUNTER — Other Ambulatory Visit: Payer: Self-pay | Admitting: Family Medicine

## 2022-10-07 MED ORDER — AMPHETAMINE-DEXTROAMPHET ER 30 MG PO CP24: 30.0000 mg | ORAL_CAPSULE | ORAL | 0 refills | Status: DC

## 2022-10-07 MED ORDER — AMPHETAMINE-DEXTROAMPHETAMINE 5 MG PO TABS: 5.0000 mg | ORAL_TABLET | Freq: Every day | ORAL | 0 refills | Status: DC

## 2022-10-13 ENCOUNTER — Other Ambulatory Visit: Payer: Self-pay | Admitting: Obstetrics

## 2022-10-13 DIAGNOSIS — R9389 Abnormal findings on diagnostic imaging of other specified body structures: Secondary | ICD-10-CM

## 2022-11-02 ENCOUNTER — Encounter: Payer: Self-pay | Admitting: Family

## 2022-11-02 ENCOUNTER — Ambulatory Visit: Payer: Self-pay | Admitting: *Deleted

## 2022-11-02 ENCOUNTER — Ambulatory Visit: Payer: BC Managed Care – PPO | Admitting: Family

## 2022-11-02 ENCOUNTER — Encounter: Payer: Self-pay | Admitting: Family Medicine

## 2022-11-02 VITALS — BP 108/78 | HR 107 | Temp 98.4°F | Ht 59.75 in | Wt 188.0 lb

## 2022-11-02 DIAGNOSIS — L0292 Furuncle, unspecified: Secondary | ICD-10-CM

## 2022-11-02 MED ORDER — AMOXICILLIN-POT CLAVULANATE 875-125 MG PO TABS
1.0000 | ORAL_TABLET | Freq: Two times a day (BID) | ORAL | 0 refills | Status: DC
Start: 2022-11-02 — End: 2022-12-21

## 2022-11-02 NOTE — Telephone Encounter (Signed)
Message from Economy P sent at 11/02/2022  9:14 AM EDT  Summary: cyst   Pt called with a cyst under her arm that is draining but very painful.  No appts until sept 23 with either provider.  CB@  [phone]          Call History  Contact Date/Time Type Contact Phone/Fax User  11/02/2022 09:13 AM EDT Phone (Incoming) Jerrika, [place] (Self) [phone] Judie Petit) Daphine Deutscher D   Reason for Disposition  [1] Boil AND [2] diabetes mellitus or weak immune system (e.g., HIV positive, cancer chemo, splenectomy, organ transplant, chronic steroids)  Answer Assessment - Initial Assessment Questions 1. APPEARANCE of BOIL: "What does the boil look like?"      I have a cyst under my arm that is draining.   I drained it Friday and Sat. Using heated compresses.   It's painful.    2. LOCATION: "Where is the boil located?"      Left armpit 3. NUMBER: "How many boils are there?"      One 4. SIZE: "How big is the boil?" (e.g., inches, cm; compare to size of a coin or other object)     Big one    I've had these before.   I one in July it was bad.    5. ONSET: "When did the boil start?"     Thursday started hurting Friday night it drained.   6. PAIN: "Is there any pain?" If Yes, ask: "How bad is the pain?"   (Scale 1-10; or mild, moderate, severe)     Yes 7. FEVER: "Do you have a fever?" If Yes, ask: "What is it, how was it measured, and when did it start?"      No 8. SOURCE: "Have you been around anyone with boils or other Staph infections?" "Have you ever had boils before?"     No 9. OTHER SYMPTOMS: "Do you have any other symptoms?" (e.g., shaking chills, weakness, rash elsewhere on body)     No 10. PREGNANCY: "Is there any chance you are pregnant?" "When was your last menstrual period?"       Not asked  Protocols used: Boil (Skin Abscess)-A-AH

## 2022-11-02 NOTE — Progress Notes (Addendum)
Patient ID: Aimee King, female    DOB: 1980-03-30  MRN: [id number]  CC: Boil Left Armpit  Subjective: Aimee King is a 42 y.o. female who presents for boil left armpit.   Her concerns today include:  11/02/2022 per triage RN note:   Chief Complaint: Boil in left armpit Symptoms: Draining white pus.   Friday and Sat. It was draining foul looking smelly pus.  She had body aches and chills over the weekend but not today.   Felt tired and slept most of the weekend. Frequency: It started hurting Thur. And has gotten worse Pertinent Negatives: Patient denies any one else in the household having this. Disposition: [] ED /[] Urgent Care (no appt availability in office) / [x] Appointment(In office/virtual)/ []  [facility]/ [] Home Care/ [] Refused Recommended Disposition /[] [place] [facility]/ []  Follow-up with PCP Additional Notes: Appt made for today at 1:20 with Ricky Stabs, NP.              Summary: cyst     Pt called with a cyst under her arm that is draining but very painful.  No appts until sept 23 with either provider.  CB@  [phone]            Call History   Contact Date/Time Type Contact Phone/Fax User  11/02/2022 09:13 AM EDT Phone (Incoming) Ardelle, [place] (Self) [phone] Judie Petit) Daphine Deutscher D    Reason for Disposition  [1] Boil AND [2] diabetes mellitus or weak immune system (e.g., HIV positive, cancer chemo, splenectomy, organ transplant, chronic steroids)  Answer Assessment - Initial Assessment Questions 1. APPEARANCE of BOIL: "What does the boil look like?"      I have a cyst under my arm that is draining.   I drained it Friday and Sat. Using heated compresses.   It's painful.    2. LOCATION: "Where is the boil located?"      Left armpit 3. NUMBER: "How many boils are there?"      One 4. SIZE: "How big is the boil?" (e.g., inches, cm; compare to size of a coin or other object)     Big one    I've had these  before.   I one in July it was bad.    5. ONSET: "When did the boil start?"     Thursday started hurting Friday night it drained.   6. PAIN: "Is there any pain?" If Yes, ask: "How bad is the pain?"   (Scale 1-10; or mild, moderate, severe)     Yes 7. FEVER: "Do you have a fever?" If Yes, ask: "What is it, how was it measured, and when did it start?"      No 8. SOURCE: "Have you been around anyone with boils or other Staph infections?" "Have you ever had boils before?"     No 9. OTHER SYMPTOMS: "Do you have any other symptoms?" (e.g., shaking chills, weakness, rash elsewhere on body)     No 10. PREGNANCY: "Is there any chance you are pregnant?" "When was your last menstrual period?"       Not asked  Protocols used: Boil (Skin Abscess)-A-AH    Today's visit 11/02/2022: - Today patient reports left boil armpit improved since began but still causing some discomfort. States she is using warm compresses to help with draining. Reports a similar occurrence happened several months ago and she did not seek medical evaluation. She is no longer having body aches. - States "right ear irritation". She denies  additional symptoms. I discussed with patient in detail ear exam appears normal except for cerumen. Patient plans to return at later date for ear lavage.   Patient Active Problem List   Diagnosis Date Noted   Desire for pregnancy 05/27/2022   Endometrial thickening on ultrasound 10/10/2021   Symptomatic anemia 05/31/2021   Vitamin D deficiency 05/31/2021   Reactive airway disease 05/31/2021   Iron deficiency anemia due to chronic blood loss 05/31/2021   Menorrhagia 05/31/2021   Class 2 obesity 05/31/2021   Personal history of COVID-19 03/30/2019   Seasonal allergies 11/25/2018   Gastroesophageal reflux disease 11/02/2018   Stress incontinence 11/12/2014   ADD (attention deficit disorder) 11/12/2014   Back pain 10/07/2012   Endometriosis 10/07/2012   Foot pain, bilateral 10/07/2012    Stomach pain 10/07/2012     Current Outpatient Medications on File Prior to Visit  Medication Sig Dispense Refill   albuterol (VENTOLIN HFA) 108 (90 Base) MCG/ACT inhaler Inhale into the lungs every 6 (six) hours as needed for wheezing or shortness of breath.     amphetamine-dextroamphetamine (ADDERALL XR) 30 MG 24 hr capsule Take 1 capsule (30 mg total) by mouth every morning. 30 capsule 0   amphetamine-dextroamphetamine (ADDERALL) 5 MG tablet Take 1 tablet (5 mg total) by mouth daily. 30 tablet 0   buPROPion (WELLBUTRIN XL) 150 MG 24 hr tablet Take 1 tablet (150 mg total) by mouth daily. 90 tablet 0   Ferric Maltol (ACCRUFER) 30 MG CAPS Take 1 capsule (30 mg total) by mouth 2 (two) times daily before a meal. Take 2 hrs before, or 2 hrs after a meal. 60 capsule 0   fexofenadine (ALLEGRA) 180 MG tablet TAKE 1 TABLET BY MOUTH EVERY DAY 30 tablet 2   glipiZIDE (GLUCOTROL) 5 MG tablet Take 1 tablet (5 mg total) by mouth 2 (two) times daily before a meal. 180 tablet 0   ibuprofen (ADVIL) 800 MG tablet Take 1 tablet (800 mg total) by mouth every 8 (eight) hours as needed. 30 tablet 5   megestrol (MEGACE) 40 MG tablet TAKE 2 TABLETS BY MOUTH TWICE A DAY 360 tablet 1   metFORMIN (GLUCOPHAGE-XR) 750 MG 24 hr tablet Take 1 tablet (750 mg total) by mouth daily with breakfast. 90 tablet 0   No current facility-administered medications on file prior to visit.    Allergies  Allergen Reactions   Latex Swelling   Codeine Nausea And Vomiting   Codeine Nausea And Vomiting   Latex     Feels like little cuts in her vagina.    Social History   Socioeconomic History   Marital status: Married    Spouse name: Not on file   Number of children: 3   Years of education: Not on file   Highest education level: Master's degree (e.g., MA, MS, MEng, MEd, MSW, MBA)  Occupational History   Occupation: educator   Tobacco Use   Smoking status: Never   Smokeless tobacco: Never  Vaping Use   Vaping status: Never  Used  Substance and Sexual Activity   Alcohol use: Yes    Alcohol/week: 0.0 standard drinks of alcohol    Comment: ocassionally   Drug use: No   Sexual activity: Yes    Partners: Male    Birth control/protection: None  Other Topics Concern   Not on file  Social History Narrative   ** Merged History Encounter **       Social Determinants of Health   Financial Resource Strain: Medium  Risk (05/14/2022)   Overall Financial Resource Strain (CARDIA)    Difficulty of Paying Living Expenses: Somewhat hard  Food Insecurity: Food Insecurity Present (05/14/2022)   Hunger Vital Sign    Worried About Running Out of Food in the Last Year: Sometimes true    Ran Out of Food in the Last Year: Never true  Transportation Needs: No Transportation Needs (05/14/2022)   PRAPARE - Administrator, Civil Service (Medical): No    Lack of Transportation (Non-Medical): No  Physical Activity: Unknown (05/14/2022)   Exercise Vital Sign    Days of Exercise per Week: 0 days    Minutes of Exercise per Session: Not on file  Stress: No Stress Concern Present (05/14/2022)   [employer] of Occupational Health - Occupational Stress Questionnaire    Feeling of Stress : Only a little  Social Connections: Socially Integrated (05/14/2022)   Social Connection and Isolation Panel [NHANES]    Frequency of Communication with Friends and Family: More than three times a week    Frequency of Social Gatherings with Friends and Family: Once a week    Attends Religious Services: More than 4 times per year    Active Member of Clubs or Organizations: Yes    Attends Engineer, structural: More than 4 times per year    Marital Status: Married  Catering manager Violence: Not on file    Family History  Problem Relation Age of Onset   Hypertension Mother    Diabetes Mother    Hypertension Father    Heart disease Father    Diabetes Father    Diabetes Maternal Grandmother    Kidney disease Maternal  Grandfather    Kidney disease Maternal Uncle    Colon cancer Neg Hx    Esophageal cancer Neg Hx    Rectal cancer Neg Hx    Stomach cancer Neg Hx     Past Surgical History:  Procedure Laterality Date   CESAREAN SECTION  2004, 2008   CESAREAN SECTION     CHOLECYSTECTOMY  02/23/2002   CHOLECYSTECTOMY     LAPAROSCOPY  02/24/2000   endometriosis   TUBAL LIGATION  02/23/2006   TUBAL LIGATION     tubal ligation reversal Right     ROS: Review of Systems Negative except as stated above  PHYSICAL EXAM: BP 108/78   Pulse (!) 107   Temp 98.4 F (36.9 C) (Oral)   Ht 4' 11.75" (1.518 m)   Wt 188 lb (85.3 kg)   SpO2 96%   BMI 37.02 kg/m   Physical Exam HENT:     Head: Normocephalic and atraumatic.     Right Ear: Tympanic membrane, ear canal and external ear normal.     Left Ear: Tympanic membrane, ear canal and external ear normal.     Nose: Nose normal.     Mouth/Throat:     Mouth: Mucous membranes are moist.     Pharynx: Oropharynx is clear.  Eyes:     Extraocular Movements: Extraocular movements intact.     Conjunctiva/sclera: Conjunctivae normal.     Pupils: Pupils are equal, round, and reactive to light.  Cardiovascular:     Rate and Rhythm: Tachycardia present.     Pulses: Normal pulses.     Heart sounds: Normal heart sounds.  Pulmonary:     Effort: Pulmonary effort is normal.     Breath sounds: Normal breath sounds.  Musculoskeletal:        General: Normal range of motion.  Cervical back: Normal range of motion and neck supple.  Skin:    Comments: Small erythematous boil left armpit, no drainage, no additional presentation.  Neurological:     General: No focal deficit present.     Mental Status: She is alert and oriented to person, place, and time.  Psychiatric:        Mood and Affect: Mood normal.        Behavior: Behavior normal.    ASSESSMENT AND PLAN: 1. Boil - Amoxicillin-Clavulanate as prescribed. Counseled on medication adherence.  - Referral  to Dermatology for further evaluation/management.  - Follow-up with primary provider in 1 week or sooner if needed. - Ambulatory referral to Dermatology - amoxicillin-clavulanate (AUGMENTIN) 875-125 MG tablet; Take 1 tablet by mouth 2 (two) times daily.  Dispense: 20 tablet; Refill: 0    Patient was given the opportunity to ask questions.  Patient verbalized understanding of the plan and was able to repeat key elements of the plan. Patient was given clear instructions to go to Emergency Department or return to medical center if symptoms don't improve, worsen, or new problems develop.The patient verbalized understanding.   Orders Placed This Encounter  Procedures   Ambulatory referral to Dermatology     Requested Prescriptions   Signed Prescriptions Disp Refills   amoxicillin-clavulanate (AUGMENTIN) 875-125 MG tablet 20 tablet 0    Sig: Take 1 tablet by mouth 2 (two) times daily.    Return in about 1 week (around 11/09/2022) for Follow-Up or next available Georganna Skeans, MD.  Rema Fendt, NP

## 2022-11-02 NOTE — Telephone Encounter (Signed)
  Chief Complaint: Boil in left armpit Symptoms: Draining white pus.   Friday and Sat. It was draining foul looking smelly pus.  She had body aches and chills over the weekend but not today.   Felt tired and slept most of the weekend. Frequency: It started hurting Thur. And has gotten worse Pertinent Negatives: Patient denies any one else in the household having this. Disposition: [] ED /[] Urgent Care (no appt availability in office) / [x] Appointment(In office/virtual)/ []  [facility]/ [] Home Care/ [] Refused Recommended Disposition /[] [place] [facility]/ []  Follow-up with PCP Additional Notes: Appt made for today at 1:20 with Ricky Stabs, NP.

## 2022-11-02 NOTE — Progress Notes (Signed)
Boil in left armpit, started Thursday. Friday used warm compress and it drained, and Saturday it refilled. States painful, with body aches.   Pt wants to have her right ear looked at.

## 2022-11-09 ENCOUNTER — Encounter: Payer: Self-pay | Admitting: Family Medicine

## 2022-11-09 ENCOUNTER — Ambulatory Visit: Payer: BC Managed Care – PPO | Admitting: Family Medicine

## 2022-11-09 VITALS — BP 114/79 | HR 98 | Temp 98.5°F | Resp 16 | Ht 60.0 in | Wt 187.8 lb

## 2022-11-09 DIAGNOSIS — E119 Type 2 diabetes mellitus without complications: Secondary | ICD-10-CM

## 2022-11-09 DIAGNOSIS — E6609 Other obesity due to excess calories: Secondary | ICD-10-CM

## 2022-11-09 DIAGNOSIS — F902 Attention-deficit hyperactivity disorder, combined type: Secondary | ICD-10-CM | POA: Diagnosis not present

## 2022-11-09 DIAGNOSIS — Z7689 Persons encountering health services in other specified circumstances: Secondary | ICD-10-CM

## 2022-11-09 DIAGNOSIS — Z6836 Body mass index (BMI) 36.0-36.9, adult: Secondary | ICD-10-CM

## 2022-11-09 DIAGNOSIS — F341 Dysthymic disorder: Secondary | ICD-10-CM

## 2022-11-09 DIAGNOSIS — Z7984 Long term (current) use of oral hypoglycemic drugs: Secondary | ICD-10-CM

## 2022-11-09 MED ORDER — AMPHETAMINE-DEXTROAMPHET ER 30 MG PO CP24: 30.0000 mg | ORAL_CAPSULE | ORAL | 0 refills | Status: DC

## 2022-11-09 MED ORDER — AMPHETAMINE-DEXTROAMPHETAMINE 5 MG PO TABS: 5.0000 mg | ORAL_TABLET | Freq: Every day | ORAL | 0 refills | Status: DC

## 2022-11-09 NOTE — Progress Notes (Unsigned)
Established Patient Office Visit  Subjective    Patient ID: Aimee King, female    DOB: 1981-02-12  Age: 42 y.o. MRN: [id number]  CC:  Chief Complaint  Patient presents with   Follow-up    HPI Aimee King presents for follow up of chronic med issues. Patient denies acute complaints or concerns.   Outpatient Encounter Medications as of 11/09/2022  Medication Sig   albuterol (VENTOLIN HFA) 108 (90 Base) MCG/ACT inhaler Inhale into the lungs every 6 (six) hours as needed for wheezing or shortness of breath.   amoxicillin-clavulanate (AUGMENTIN) 875-125 MG tablet Take 1 tablet by mouth 2 (two) times daily.   buPROPion (WELLBUTRIN XL) 150 MG 24 hr tablet Take 1 tablet (150 mg total) by mouth daily.   Ferric Maltol (ACCRUFER) 30 MG CAPS Take 1 capsule (30 mg total) by mouth 2 (two) times daily before a meal. Take 2 hrs before, or 2 hrs after a meal.   fexofenadine (ALLEGRA) 180 MG tablet TAKE 1 TABLET BY MOUTH EVERY DAY   glipiZIDE (GLUCOTROL) 5 MG tablet Take 1 tablet (5 mg total) by mouth 2 (two) times daily before a meal.   ibuprofen (ADVIL) 800 MG tablet Take 1 tablet (800 mg total) by mouth every 8 (eight) hours as needed.   megestrol (MEGACE) 40 MG tablet TAKE 2 TABLETS BY MOUTH TWICE A DAY   metFORMIN (GLUCOPHAGE-XR) 750 MG 24 hr tablet Take 1 tablet (750 mg total) by mouth daily with breakfast.   [DISCONTINUED] amphetamine-dextroamphetamine (ADDERALL XR) 30 MG 24 hr capsule Take 1 capsule (30 mg total) by mouth every morning.   [DISCONTINUED] amphetamine-dextroamphetamine (ADDERALL) 5 MG tablet Take 1 tablet (5 mg total) by mouth daily.   amphetamine-dextroamphetamine (ADDERALL XR) 30 MG 24 hr capsule Take 1 capsule (30 mg total) by mouth every morning.   amphetamine-dextroamphetamine (ADDERALL) 5 MG tablet Take 1 tablet (5 mg total) by mouth daily.   No facility-administered encounter medications on file as of 11/09/2022.    Past Medical History:   Diagnosis Date   Acid reflux    ADD (attention deficit disorder)    Allergy    Anemia    Attention deficit disorder (ADD)    Depression    Endometriosis    IBS (irritable bowel syndrome)     Past Surgical History:  Procedure Laterality Date   CESAREAN SECTION  2004, 2008   CESAREAN SECTION     CHOLECYSTECTOMY  02/23/2002   CHOLECYSTECTOMY     LAPAROSCOPY  02/24/2000   endometriosis   TUBAL LIGATION  02/23/2006   TUBAL LIGATION     tubal ligation reversal Right     Family History  Problem Relation Age of Onset   Hypertension Mother    Diabetes Mother    Hypertension Father    Heart disease Father    Diabetes Father    Diabetes Maternal Grandmother    Kidney disease Maternal Grandfather    Kidney disease Maternal Uncle    Colon cancer Neg Hx    Esophageal cancer Neg Hx    Rectal cancer Neg Hx    Stomach cancer Neg Hx     Social History   Socioeconomic History   Marital status: Married    Spouse name: Not on file   Number of children: 3   Years of education: Not on file   Highest education level: Master's degree (e.g., MA, MS, MEng, MEd, MSW, MBA)  Occupational History   Occupation: educator   Tobacco Use  Smoking status: Never   Smokeless tobacco: Never  Vaping Use   Vaping status: Never Used  Substance and Sexual Activity   Alcohol use: Yes    Alcohol/week: 0.0 standard drinks of alcohol    Comment: ocassionally   Drug use: No   Sexual activity: Yes    Partners: Male    Birth control/protection: None  Other Topics Concern   Not on file  Social History Narrative   ** Merged History Encounter **       Social Determinants of Health   Financial Resource Strain: Medium Risk (05/14/2022)   Overall Financial Resource Strain (CARDIA)    Difficulty of Paying Living Expenses: Somewhat hard  Food Insecurity: Food Insecurity Present (05/14/2022)   Hunger Vital Sign    Worried About Running Out of Food in the Last Year: Sometimes true    Ran Out of Food  in the Last Year: Never true  Transportation Needs: No Transportation Needs (05/14/2022)   PRAPARE - Administrator, Civil Service (Medical): No    Lack of Transportation (Non-Medical): No  Physical Activity: Unknown (05/14/2022)   Exercise Vital Sign    Days of Exercise per Week: 0 days    Minutes of Exercise per Session: Not on file  Stress: Stress Concern Present (11/09/2022)   [employer] of Occupational Health - Occupational Stress Questionnaire    Feeling of Stress : To some extent  Social Connections: Socially Integrated (05/14/2022)   Social Connection and Isolation Panel [NHANES]    Frequency of Communication with Friends and Family: More than three times a week    Frequency of Social Gatherings with Friends and Family: Once a week    Attends Religious Services: More than 4 times per year    Active Member of [employer] or Organizations: Yes    Attends Banker Meetings: More than 4 times per year    Marital Status: Married  Catering manager Violence: Not At Risk (11/09/2022)   Humiliation, Afraid, Rape, and Kick questionnaire    Fear of Current or Ex-Partner: No    Emotionally Abused: No    Physically Abused: No    Sexually Abused: No    Review of Systems  All other systems reviewed and are negative.       Objective    BP 114/79 (BP Location: Right Arm, Patient Position: Sitting, Cuff Size: Large)   Pulse 98   Temp 98.5 F (36.9 C) (Oral)   Resp 16   Ht 5' (1.524 m)   Wt 187 lb 12.8 oz (85.2 kg)   SpO2 97%   BMI 36.68 kg/m   Physical Exam Vitals and nursing note reviewed.  Constitutional:      General: She is not in acute distress. Cardiovascular:     Rate and Rhythm: Normal rate and regular rhythm.  Pulmonary:     Effort: Pulmonary effort is normal.     Breath sounds: Normal breath sounds.  Abdominal:     Palpations: Abdomen is soft.     Tenderness: There is no abdominal tenderness.  Neurological:     General: No focal deficit  present.     Mental Status: She is alert and oriented to person, place, and time.         Assessment & Plan:   1. Type 2 diabetes mellitus without complication, without long-term current use of insulin (HCC)   2. Encounter for weight management   3. Class 2 obesity due to excess calories without serious comorbidity  with body mass index (BMI) of 36.0 to 36.9 in adult   4. Attention deficit hyperactivity disorder (ADHD), combined type   5. Dysthymia   6. Diabetes mellitus treated with oral medication (HCC)  Return in about 3 months (around 02/08/2023) for follow up.   [name], MD

## 2022-11-10 ENCOUNTER — Encounter: Payer: Self-pay | Admitting: Family Medicine

## 2022-11-19 ENCOUNTER — Ambulatory Visit: Payer: BC Managed Care – PPO | Admitting: Family Medicine

## 2022-11-19 ENCOUNTER — Other Ambulatory Visit: Payer: Self-pay

## 2022-11-19 ENCOUNTER — Other Ambulatory Visit: Payer: Self-pay | Admitting: Family Medicine

## 2022-11-19 DIAGNOSIS — E119 Type 2 diabetes mellitus without complications: Secondary | ICD-10-CM

## 2022-11-19 DIAGNOSIS — F32A Depression, unspecified: Secondary | ICD-10-CM

## 2022-11-19 MED ORDER — METFORMIN HCL ER 750 MG PO TB24: 750.0000 mg | ORAL_TABLET | Freq: Every day | ORAL | 0 refills | Status: DC

## 2022-11-19 MED ORDER — GLIPIZIDE 5 MG PO TABS
5.0000 mg | ORAL_TABLET | Freq: Two times a day (BID) | ORAL | 0 refills | Status: DC
Start: 2022-11-19 — End: 2023-02-18

## 2022-11-19 MED ORDER — BUPROPION HCL ER (XL) 150 MG PO TB24: 150.0000 mg | ORAL_TABLET | Freq: Every day | ORAL | 0 refills | Status: DC

## 2022-11-20 IMAGING — CT CT ANGIO CHEST
3 of 7 series · 18 of 46 positions shown · IV contrast (agent unspecified)
Comparison: None.

CLINICAL DATA: Chest pain.  Acute aortic syndrome.

EXAM:
CT ANGIOGRAPHY CHEST WITH CONTRAST
TECHNIQUE: Multidetector CT imaging of the chest was performed using the
standard protocol during bolus administration of intravenous
contrast. Multiplanar CT image reconstructions and MIPs were
obtained to evaluate the vascular anatomy.

[Series 6: axial arterial · axial · arterial · 0.83mm/px · z∈[+1544,+1760]mm · 9 of 90 slices shown]
[im 9/90  lung]
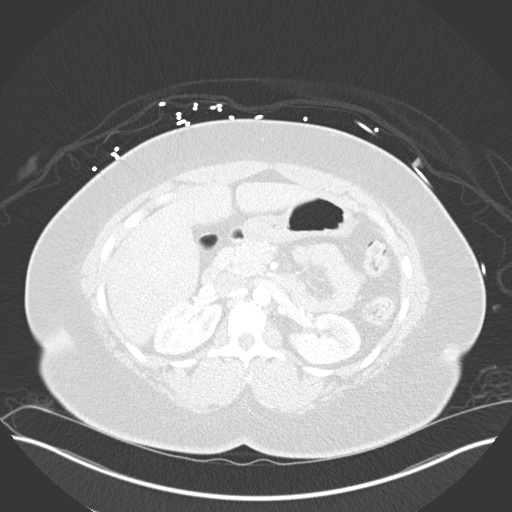
[im 18/90  soft-tissue]
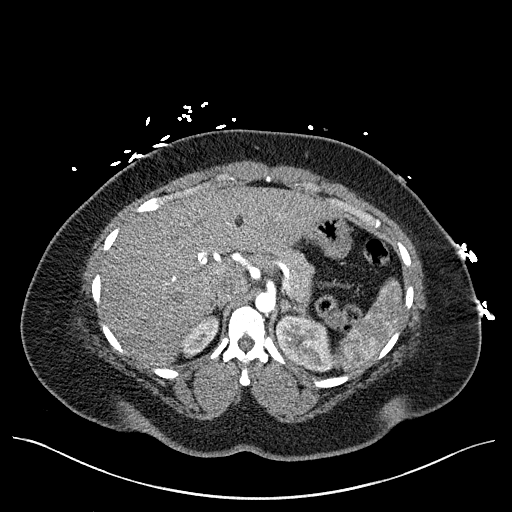
[im 27/90  lung]
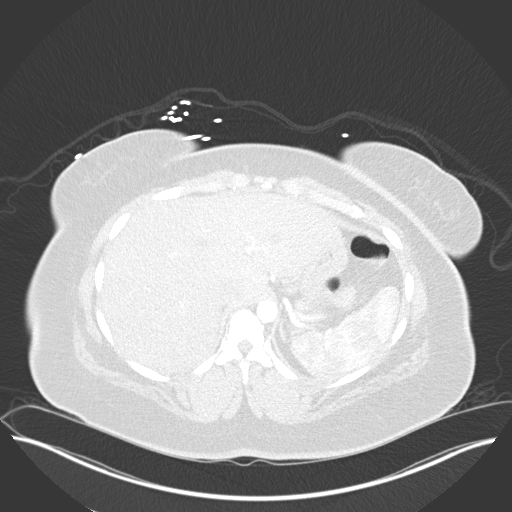
[im 36/90  soft-tissue]
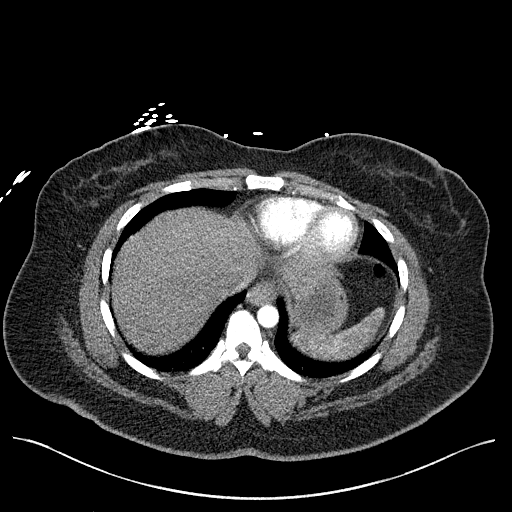
[im 45/90  lung]
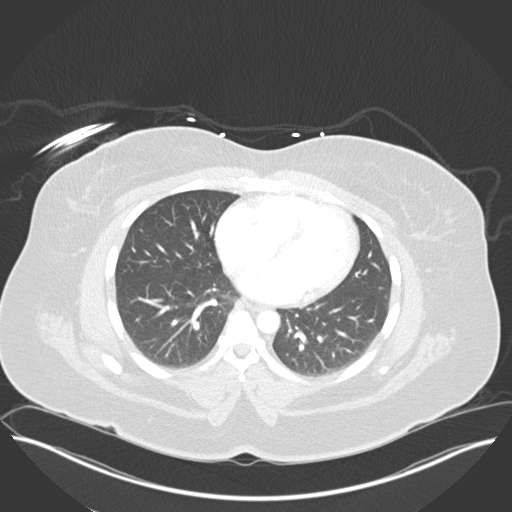
[im 54/90  soft-tissue]
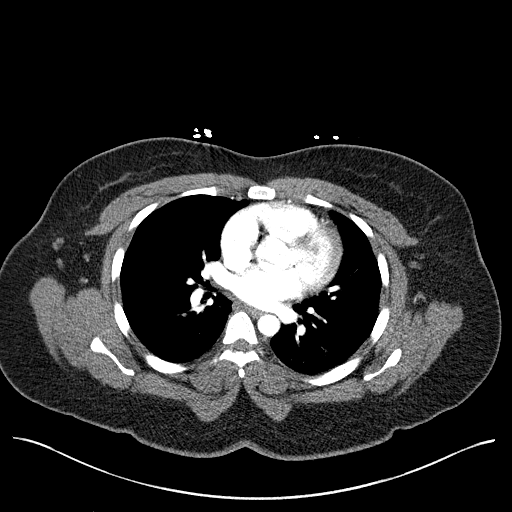
[im 63/90  lung]
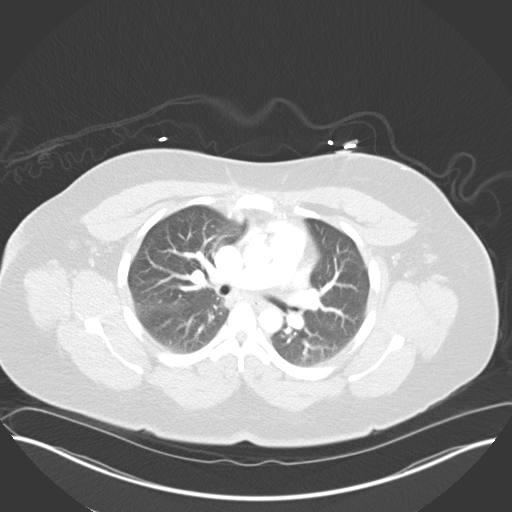
[im 72/90  soft-tissue]
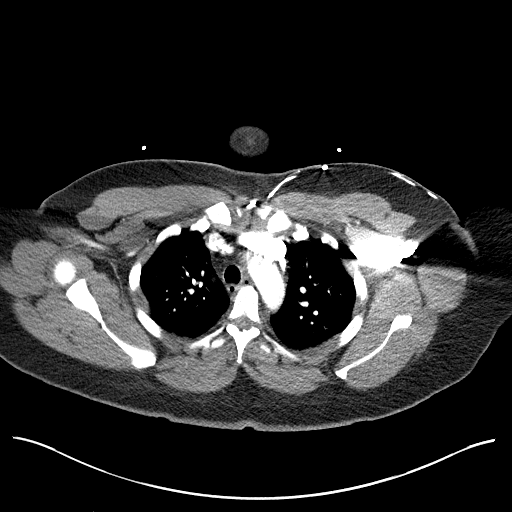
[im 81/90  lung]
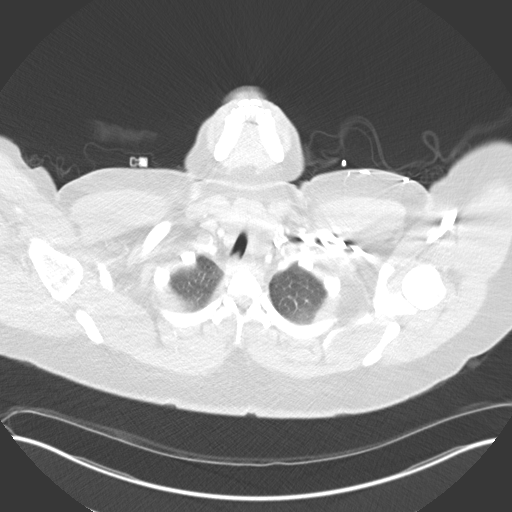

[Series 7: coronals · coronal · 0.52mm/px · 3 of 119 slices shown]
[im 30/119  soft-tissue]
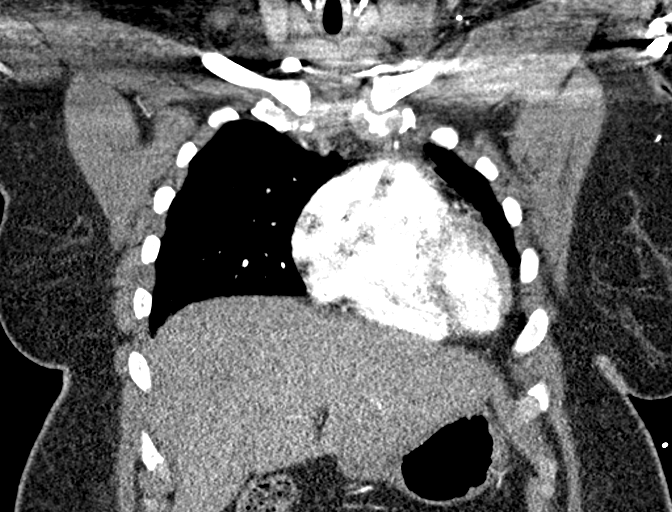
[im 60/119  soft-tissue]
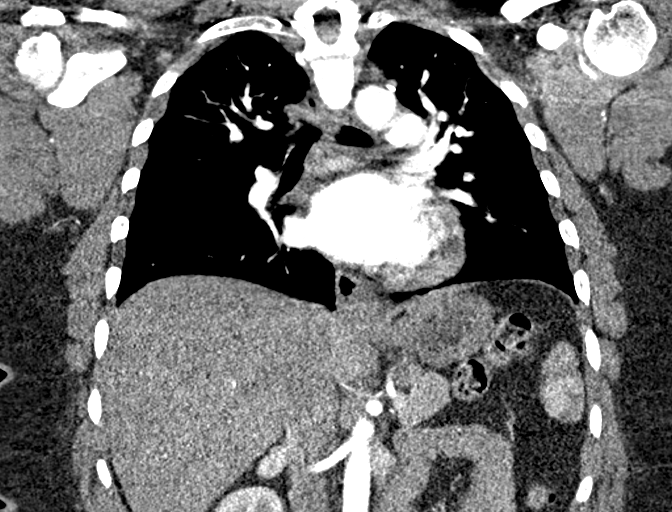
[im 89/119  soft-tissue]
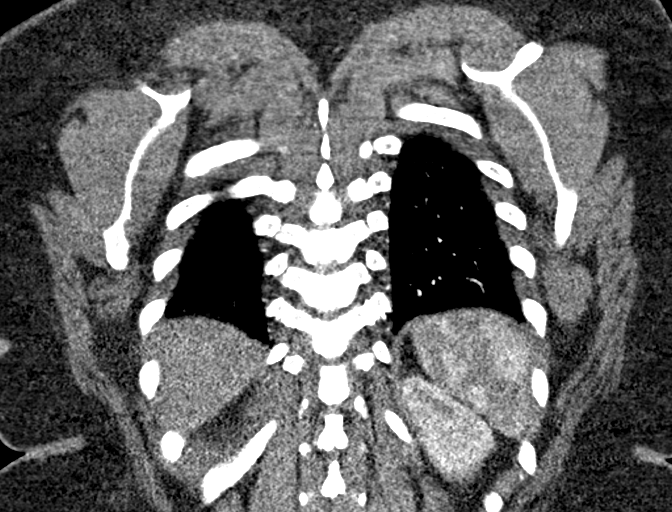

[Series 12: lung · axial · 0.83mm/px · z∈[+1536,+1696]mm · 6 of 134 slices shown]
[im 9/134  soft-tissue]
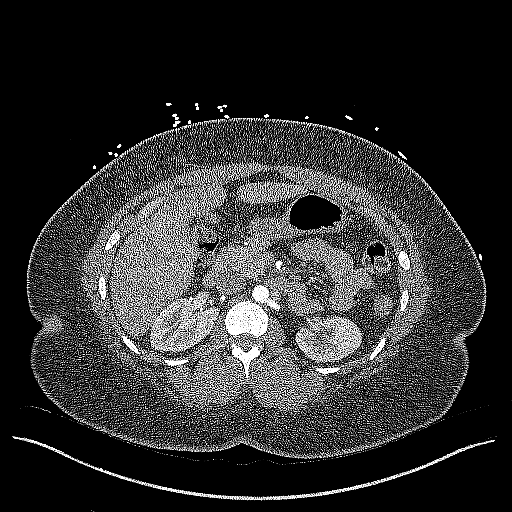
[im 27/134  soft-tissue]
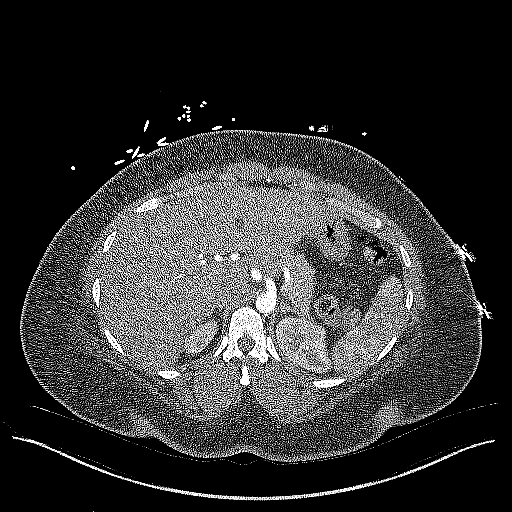
[im 45/134  soft-tissue]
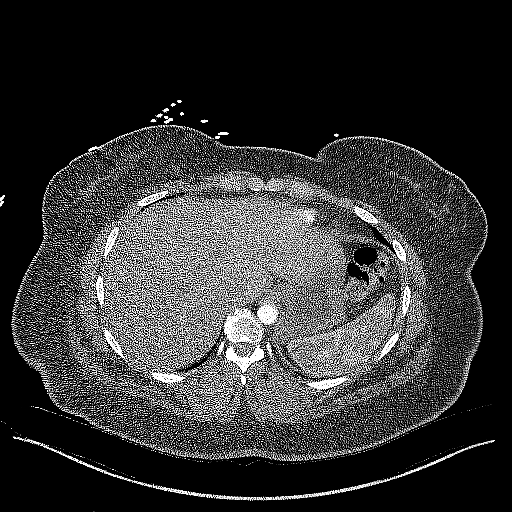
[im 63/134  soft-tissue]
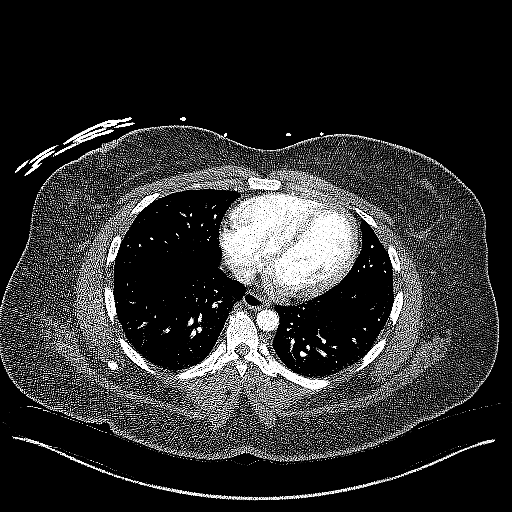
[im 71/134  soft-tissue]
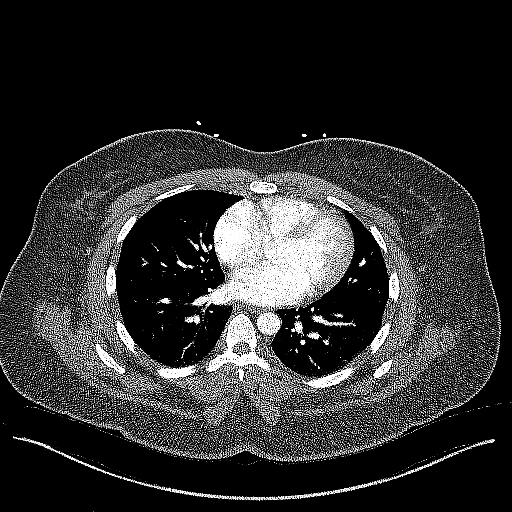
[im 89/134  soft-tissue]
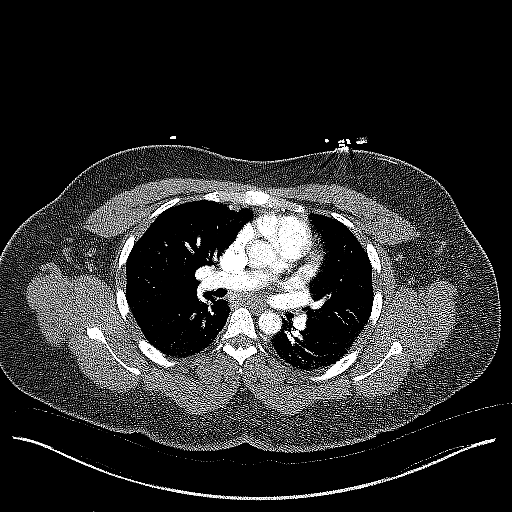

[18 of 46 positions shown; findings below may reference images not displayed]

RADIATION DOSE REDUCTION: This exam was performed according to the
departmental dose-optimization program which includes automated
exposure control, adjustment of the mA and/or kV according to
patient size and/or use of iterative reconstruction technique.

CONTRAST:  100mL OMNIPAQUE IOHEXOL 350 MG/ML SOLN
FINDINGS: Cardiovascular: Pre contrast imaging shows no dense crescent in the
wall of the thoracic aorta to suggest acute intramural hematoma. No
thoracic aortic aneurysm. No dissection of the thoracic aorta. The
heart size is normal. No substantial pericardial effusion.

Mediastinum/Nodes: No mediastinal lymphadenopathy. There is no hilar
lymphadenopathy. The esophagus has normal imaging features. There is
no axillary lymphadenopathy.

Lungs/Pleura: No suspicious pulmonary nodule or mass. No focal
airspace consolidation. No pleural effusion.

Upper Abdomen: Unremarkable.

Musculoskeletal: No worrisome lytic or sclerotic osseous
abnormality.

Review of the MIP images confirms the above findings.
IMPRESSION: 1. No CT evidence for acute intramural hematoma or dissection of the
thoracic aorta. No thoracic aortic aneurysm.
2. No acute findings in the chest.

## 2022-12-07 ENCOUNTER — Encounter (HOSPITAL_COMMUNITY): Payer: Self-pay

## 2022-12-07 ENCOUNTER — Other Ambulatory Visit: Payer: Self-pay

## 2022-12-07 ENCOUNTER — Emergency Department (HOSPITAL_COMMUNITY): Payer: BC Managed Care – PPO

## 2022-12-07 ENCOUNTER — Ambulatory Visit: Payer: BC Managed Care – PPO | Admitting: Family Medicine

## 2022-12-07 ENCOUNTER — Ambulatory Visit
Admission: EM | Admit: 2022-12-07 | Discharge: 2022-12-07 | Disposition: A | Discharge status: Home / Self Care | Payer: BC Managed Care – PPO | Attending: Internal Medicine | Admitting: Internal Medicine

## 2022-12-07 ENCOUNTER — Emergency Department (HOSPITAL_COMMUNITY)
Admission: EM | Admit: 2022-12-07 | Discharge: 2022-12-07 | Disposition: A | Discharge status: Home / Self Care | Payer: BC Managed Care – PPO | Attending: Emergency Medicine | Admitting: Emergency Medicine

## 2022-12-07 DIAGNOSIS — Z91040 Latex allergy status: Secondary | ICD-10-CM | POA: Insufficient documentation

## 2022-12-07 DIAGNOSIS — R079 Chest pain, unspecified: Secondary | ICD-10-CM

## 2022-12-07 DIAGNOSIS — E119 Type 2 diabetes mellitus without complications: Secondary | ICD-10-CM | POA: Diagnosis not present

## 2022-12-07 DIAGNOSIS — Z7984 Long term (current) use of oral hypoglycemic drugs: Secondary | ICD-10-CM | POA: Insufficient documentation

## 2022-12-07 LAB — BASIC METABOLIC PANEL
Anion gap: 11 (ref 5–15)
BUN: 11 mg/dL (ref 6–20)
CO2: 21 mmol/L — ABNORMAL LOW (ref 22–32)
Calcium: 9.1 mg/dL (ref 8.9–10.3)
Chloride: 108 mmol/L (ref 98–111)
Creatinine, Ser: 0.91 mg/dL (ref 0.44–1.00)
GFR, Estimated: >60 mL/min (ref >60)
Glucose, Bld: 81 mg/dL (ref 70–99)
Potassium: 4.2 mmol/L (ref 3.5–5.1)
Sodium: 140 mmol/L (ref 135–145)

## 2022-12-07 LAB — D-DIMER, QUANTITATIVE: D-Dimer, Quant: 0.3 ug{FEU}/mL (ref 0.00–0.50)

## 2022-12-07 LAB — CBC
HCT: 37.9 % (ref 36.0–46.0)
Hemoglobin: 11 g/dL — ABNORMAL LOW (ref 12.0–15.0)
MCH: 22.8 pg — ABNORMAL LOW (ref 26.0–34.0)
MCHC: 29 g/dL — ABNORMAL LOW (ref 30.0–36.0)
MCV: 78.5 fL — ABNORMAL LOW (ref 80.0–100.0)
Platelets: 300 10*3/uL (ref 150–400)
RBC: 4.83 MIL/uL (ref 3.87–5.11)
RDW: 17 % — ABNORMAL HIGH (ref 11.5–15.5)
WBC: 7.5 10*3/uL (ref 4.0–10.5)
nRBC: 0 % (ref 0.0–0.2)

## 2022-12-07 LAB — TROPONIN I (HIGH SENSITIVITY)
Troponin I (High Sensitivity): 2 ng/L (ref <18)
Troponin I (High Sensitivity): 3 ng/L (ref <18)

## 2022-12-07 LAB — HCG, SERUM, QUALITATIVE: Preg, Serum: NEGATIVE

## 2022-12-07 MED ORDER — NAPROXEN 500 MG PO TABS: 500.0000 mg | ORAL_TABLET | Freq: Two times a day (BID) | ORAL | 0 refills | Status: DC

## 2022-12-07 MED ORDER — IOHEXOL 350 MG/ML SOLN
75.0000 mL | Freq: Once | INTRAVENOUS | Status: AC | PRN
  Administered 2022-12-07: 75 mL via INTRAVENOUS

## 2022-12-07 NOTE — ED Triage Notes (Addendum)
C/O constant left upper chest pain onset this AM. States pain is worse with movement and with deep breathing. Denies any radiation of pain, denies n/v or dizziness.  States has been having a lot of bloating and gas last night.

## 2022-12-07 NOTE — ED Triage Notes (Signed)
Pt states she has left chest pain that worsens w/movement. Pain radiates into shoulder. Pt states pain feels like a weight. Pt denies nausea, vomiting or dizziness. Pt feels like she is unable to take a deep breath but denies shortness of breath.

## 2022-12-07 NOTE — ED Notes (Signed)
Patient is being discharged from the Urgent Care Center and sent to the Emergency Department via private vehicle with staff. Per Laren Everts, NP, patient is stable but in need of higher level of care due to chest pain. Patient is aware and verbalizes understanding of plan of care.  Vitals:   12/07/22 0939  BP: 123/87  Pulse: (!) 103  Resp: 18  Temp: 98.1 F (36.7 C)  SpO2: 97%

## 2022-12-07 NOTE — ED Provider Notes (Signed)
[name]    CSN: [id number] Arrival date & time: 12/07/22  0934      History   Chief Complaint Chief Complaint  Patient presents with   Chest Pain    HPI Aimee King is a 42 y.o. female.   Patient presents with chest pain that started around 7:45 AM this morning.  Reports it is constant and feels like a "pulling and pressure" on the left side of her chest.  Reports that pain worsens with inspiration but she denies shortness of breath, headache, dizziness, blurred vision, nausea, vomiting.  Pain does not radiate down the arm and she denies numbness or tingling.  Denies any injury to the area.   Chest Pain   Past Medical History:  Diagnosis Date   Acid reflux    ADD (attention deficit disorder)    Allergy    Anemia    Attention deficit disorder (ADD)    Depression    Endometriosis    IBS (irritable bowel syndrome)     Patient Active Problem List   Diagnosis Date Noted   Desire for pregnancy 05/27/2022   Endometrial thickening on ultrasound 10/10/2021   Symptomatic anemia 05/31/2021   Vitamin D deficiency 05/31/2021   Reactive airway disease 05/31/2021   Iron deficiency anemia due to chronic blood loss 05/31/2021   Menorrhagia 05/31/2021   Class 2 obesity 05/31/2021   Personal history of COVID-19 03/30/2019   Seasonal allergies 11/25/2018   Gastroesophageal reflux disease 11/02/2018   Stress incontinence 11/12/2014   ADD (attention deficit disorder) 11/12/2014   Back pain 10/07/2012   Endometriosis 10/07/2012   Foot pain, bilateral 10/07/2012   Stomach pain 10/07/2012    Past Surgical History:  Procedure Laterality Date   CESAREAN SECTION  2004, 2008   CESAREAN SECTION     CHOLECYSTECTOMY  02/23/2002   CHOLECYSTECTOMY     LAPAROSCOPY  02/24/2000   endometriosis   TUBAL LIGATION  02/23/2006   TUBAL LIGATION     tubal ligation reversal Right     OB History     Gravida  3   Para  3   Term  3   Preterm      AB       Living  3      SAB      IAB      Ectopic      Multiple      Live Births  3            Home Medications    Prior to Admission medications   Medication Sig Start Date End Date Taking? Authorizing Provider  Ferric Maltol (ACCRUFER) 30 MG CAPS Take 1 capsule (30 mg total) by mouth 2 (two) times daily before a meal. Take 2 hrs before, or 2 hrs after a meal. 09/21/22  Yes Lennart Pall, MD  albuterol (VENTOLIN HFA) 108 (90 Base) MCG/ACT inhaler Inhale into the lungs every 6 (six) hours as needed for wheezing or shortness of breath.    [provider]  amoxicillin-clavulanate (AUGMENTIN) 875-125 MG tablet Take 1 tablet by mouth 2 (two) times daily. 11/02/22   Rema Fendt, NP  amphetamine-dextroamphetamine (ADDERALL XR) 30 MG 24 hr capsule Take 1 capsule (30 mg total) by mouth every morning. 11/09/22   Georganna Skeans, MD  amphetamine-dextroamphetamine (ADDERALL) 5 MG tablet Take 1 tablet (5 mg total) by mouth daily. 11/09/22   Georganna Skeans, MD  buPROPion (WELLBUTRIN XL) 150 MG 24 hr tablet TAKE 1  TABLET BY MOUTH EVERY DAY 11/19/22   Georganna Skeans, MD  buPROPion (WELLBUTRIN XL) 150 MG 24 hr tablet Take 1 tablet (150 mg total) by mouth daily. 11/19/22   Georganna Skeans, MD  fexofenadine (ALLEGRA) 180 MG tablet TAKE 1 TABLET BY MOUTH EVERY DAY 08/05/22   Georganna Skeans, MD  glipiZIDE (GLUCOTROL) 5 MG tablet TAKE 1 TABLET (5 MG TOTAL) BY MOUTH TWICE A DAY BEFORE MEALS 11/19/22   Georganna Skeans, MD  glipiZIDE (GLUCOTROL) 5 MG tablet Take 1 tablet (5 mg total) by mouth 2 (two) times daily before a meal. 11/19/22   Georganna Skeans, MD  ibuprofen (ADVIL) 800 MG tablet Take 1 tablet (800 mg total) by mouth every 8 (eight) hours as needed. 04/21/22   Brock Bad, MD  megestrol (MEGACE) 40 MG tablet TAKE 2 TABLETS BY MOUTH TWICE A DAY 10/13/22   Reva Bores, MD  metFORMIN (GLUCOPHAGE-XR) 750 MG 24 hr tablet TAKE 1 TABLET BY MOUTH EVERY DAY WITH BREAKFAST 11/19/22   Georganna Skeans,  MD  metFORMIN (GLUCOPHAGE-XR) 750 MG 24 hr tablet Take 1 tablet (750 mg total) by mouth daily with breakfast. 11/19/22   Georganna Skeans, MD    Family History Family History  Problem Relation Age of Onset   Hypertension Mother    Diabetes Mother    Hypertension Father    Heart disease Father    Diabetes Father    Diabetes Maternal Grandmother    Kidney disease Maternal Grandfather    Kidney disease Maternal Uncle    Colon cancer Neg Hx    Esophageal cancer Neg Hx    Rectal cancer Neg Hx    Stomach cancer Neg Hx     Social History Social History   Tobacco Use   Smoking status: Never   Smokeless tobacco: Never  Vaping Use   Vaping status: Never Used  Substance Use Topics   Alcohol use: Yes    Alcohol/week: 0.0 standard drinks of alcohol    Comment: ocassionally   Drug use: No     Allergies   Latex, Codeine, Codeine, and Latex   Review of Systems Review of Systems Per HPI  Physical Exam Triage Vital Signs ED Triage Vitals  Encounter Vitals Group     BP 12/07/22 0939 123/87     Systolic BP Percentile --      Diastolic BP Percentile --      Pulse Rate 12/07/22 0939 (!) 103     Resp 12/07/22 0939 18     Temp 12/07/22 0939 98.1 F (36.7 C)     Temp Source 12/07/22 0939 Oral     SpO2 12/07/22 0939 97 %     Weight --      Height --      Head Circumference --      Peak Flow --      Pain Score 12/07/22 0953 4     Pain Loc --      Pain Education --      Exclude from Growth Chart --    No data found.  Updated Vital Signs BP 123/87   Pulse (!) 103   Temp 98.1 F (36.7 C) (Oral)   Resp 18   SpO2 97%   Visual Acuity Right Eye Distance:   Left Eye Distance:   Bilateral Distance:    Right Eye Near:   Left Eye Near:    Bilateral Near:     Physical Exam Constitutional:      General: She is  not in acute distress.    Appearance: Normal appearance. She is not toxic-appearing or diaphoretic.  HENT:     Head: Normocephalic and atraumatic.  Eyes:      Extraocular Movements: Extraocular movements intact.     Conjunctiva/sclera: Conjunctivae normal.  Cardiovascular:     Rate and Rhythm: Normal rate and regular rhythm.     Pulses: Normal pulses.     Heart sounds: Normal heart sounds.  Pulmonary:     Effort: Pulmonary effort is normal. No respiratory distress.     Breath sounds: Normal breath sounds. No stridor. No wheezing, rhonchi or rales.  Chest:     Chest wall: No tenderness.  Neurological:     General: No focal deficit present.     Mental Status: She is alert and oriented to person, place, and time. Mental status is at baseline.  Psychiatric:        Mood and Affect: Mood normal.        Behavior: Behavior normal.        Thought Content: Thought content normal.        Judgment: Judgment normal.      UC Treatments / Results  Labs (all labs ordered are listed, but only abnormal results are displayed) Labs Reviewed - No data to display  EKG   Radiology No results found.  Procedures Procedures (including critical care time)  Medications Ordered in UC Medications - No data to display  Initial Impression / Assessment and Plan / UC Course  I have reviewed the triage vital signs and the nursing notes.  Pertinent labs & imaging results that were available during my care of the patient were reviewed by me and considered in my medical decision making (see chart for details).     EKG normal sinus rhythm.  Although, patient had hemoglobin of 6.2 the last time she had chest pain.  Therefore, I think that more adequate evaluation and management is necessary given limited resources here in urgent care so the patient was advised to go to the ER for further evaluation.  Patient was agreeable with going to the ER but declined EMS transportation.  She wished for her husband to transport her.  Patient left via self transport to go to the emergency department. Final Clinical Impressions(s) / UC Diagnoses   Final diagnoses:  Chest  pain, unspecified type     Discharge Instructions      Please go to the emergency department as soon as you leave urgent care for further evaluation and management.    ED Prescriptions   None    PDMP not reviewed this encounter.   Gustavus Bryant, [place] 12/07/22 [phone]

## 2022-12-07 NOTE — ED Provider Notes (Signed)
[place] EMERGENCY DEPARTMENT AT [facility] Provider Note   CSN: [id number] Arrival date & time: 12/07/22  1030     History Chief Complaint  Patient presents with   Chest Pain    Aimee King is a 42 y.o. female with history of diabetes and ADD presents to the emergency department today for evaluation of left-sided chest pain.  Patient reports that the chest pain started today at 0745 while she was driving.  Left side in the chest pain.  Reports she feels pressure.  Feels worse with movement and with deep inspiration.  Will occasionally radiate to her left shoulder.  She denies any shortness of breath.  Denies recent cough or cold symptoms.  She denies any lightheadedness or any nausea or diaphoresis.  She denies any recent heavy lifting or trauma to the area.  No recent travel or leg swelling.  She is on exogenous hormone use to control her periods.  She denies any belly pain.  She reports that it is worse with exertion but is mainly because of the movement.  She was sent over here from urgent care for evaluation.  Denies any recent doses changes to it medications.  She is allergic to codeine.  Denies any tobacco, EtOH illicit drug use.  Chest Pain Associated symptoms: no abdominal pain, no cough, no fever, no nausea, no palpitations, no shortness of breath and no vomiting        Home Medications Prior to Admission medications   Medication Sig Start Date End Date Taking? Authorizing Provider  albuterol (VENTOLIN HFA) 108 (90 Base) MCG/ACT inhaler Inhale into the lungs every 6 (six) hours as needed for wheezing or shortness of breath.    [provider]  amoxicillin-clavulanate (AUGMENTIN) 875-125 MG tablet Take 1 tablet by mouth 2 (two) times daily. 11/02/22   Rema Fendt, NP  amphetamine-dextroamphetamine (ADDERALL XR) 30 MG 24 hr capsule Take 1 capsule (30 mg total) by mouth every morning. 11/09/22   Georganna Skeans, MD  amphetamine-dextroamphetamine  (ADDERALL) 5 MG tablet Take 1 tablet (5 mg total) by mouth daily. 11/09/22   Georganna Skeans, MD  buPROPion (WELLBUTRIN XL) 150 MG 24 hr tablet TAKE 1 TABLET BY MOUTH EVERY DAY 11/19/22   Georganna Skeans, MD  buPROPion (WELLBUTRIN XL) 150 MG 24 hr tablet Take 1 tablet (150 mg total) by mouth daily. 11/19/22   Georganna Skeans, MD  Ferric Maltol (ACCRUFER) 30 MG CAPS Take 1 capsule (30 mg total) by mouth 2 (two) times daily before a meal. Take 2 hrs before, or 2 hrs after a meal. 09/21/22   Lennart Pall, MD  fexofenadine (ALLEGRA) 180 MG tablet TAKE 1 TABLET BY MOUTH EVERY DAY 08/05/22   Georganna Skeans, MD  glipiZIDE (GLUCOTROL) 5 MG tablet TAKE 1 TABLET (5 MG TOTAL) BY MOUTH TWICE A DAY BEFORE MEALS 11/19/22   Georganna Skeans, MD  glipiZIDE (GLUCOTROL) 5 MG tablet Take 1 tablet (5 mg total) by mouth 2 (two) times daily before a meal. 11/19/22   Georganna Skeans, MD  ibuprofen (ADVIL) 800 MG tablet Take 1 tablet (800 mg total) by mouth every 8 (eight) hours as needed. 04/21/22   Brock Bad, MD  megestrol (MEGACE) 40 MG tablet TAKE 2 TABLETS BY MOUTH TWICE A DAY 10/13/22   Reva Bores, MD  metFORMIN (GLUCOPHAGE-XR) 750 MG 24 hr tablet TAKE 1 TABLET BY MOUTH EVERY DAY WITH BREAKFAST 11/19/22   Georganna Skeans, MD  metFORMIN (GLUCOPHAGE-XR) 750 MG 24 hr  tablet Take 1 tablet (750 mg total) by mouth daily with breakfast. 11/19/22   Georganna Skeans, MD      Allergies    Latex, Codeine, Codeine, and Latex    Review of Systems   Review of Systems  Constitutional:  Negative for chills and fever.  HENT:  Negative for congestion and rhinorrhea.   Respiratory:  Negative for cough and shortness of breath.   Cardiovascular:  Positive for chest pain. Negative for palpitations and leg swelling.  Gastrointestinal:  Negative for abdominal pain, nausea and vomiting.  Neurological:  Negative for syncope and light-headedness.    Physical Exam Updated Vital Signs BP 120/81 (BP Location: Right Arm)   Pulse 96    Temp 98.6 F (37 C) (Oral)   Resp 18   Ht 5' (1.524 m)   Wt 82.6 kg   LMP  (LMP Unknown) Comment: takes BC to not have lmp  SpO2 100%   BMI 35.54 kg/m  Physical Exam Vitals and nursing note reviewed.  Constitutional:      General: She is not in acute distress.    Appearance: She is not toxic-appearing.  Cardiovascular:     Rate and Rhythm: Normal rate.     Comments: Borderline tachycardia.  Palpable DP, PT, radial pulses that are symmetric. Pulmonary:     Effort: Pulmonary effort is normal.     Breath sounds: Normal breath sounds. No decreased breath sounds, wheezing or rhonchi.  Chest:     Chest wall: No tenderness.     Comments: Nontender to palpation.  No overlying skin changes noted.  No crepitus.  No overlying erythema, induration, fluctuance, or increase in warmth. Musculoskeletal:     Cervical back: Normal range of motion.     Right lower leg: No tenderness. No edema.     Left lower leg: No tenderness. No edema.     Comments: Legs appear symmetric.  No unilateral leg swelling appreciated.  Compartments are soft.  Skin:    General: Skin is warm and dry.  Neurological:     Mental Status: She is alert.     ED Results / Procedures / Treatments   Labs (all labs ordered are listed, but only abnormal results are displayed) Labs Reviewed  BASIC METABOLIC PANEL - Abnormal; Notable for the following components:      Result Value   CO2 21 (*)    All other components within normal limits  CBC - Abnormal; Notable for the following components:   Hemoglobin 11.0 (*)    MCV 78.5 (*)    MCH 22.8 (*)    MCHC 29.0 (*)    RDW 17.0 (*)    All other components within normal limits  HCG, SERUM, QUALITATIVE  D-DIMER, QUANTITATIVE  TROPONIN I (HIGH SENSITIVITY)  TROPONIN I (HIGH SENSITIVITY)    EKG None  Radiology DG Chest 2 View  Result Date: 12/07/2022 CLINICAL DATA:  Chest pain EXAM: CHEST - 2 VIEW COMPARISON:  05/31/2021 FINDINGS: The heart size and mediastinal  contours are within normal limits. Both lungs are clear. The visualized skeletal structures are unremarkable. IMPRESSION: No active cardiopulmonary disease. Electronically Signed   By: Judie Petit.  Shick M.D.   On: 12/07/2022 13:53    Procedures Procedures   Medications Ordered in ED Medications - No data to display  ED Course/ Medical Decision Making/ A&P  Medical Decision Making Amount and/or Complexity of Data Reviewed Labs: ordered. Radiology: ordered.   42 y.o. female presents to the ER for evaluation of chest pain. Differential diagnosis includes but is not limited to ACS, pericarditis, myocarditis, aortic dissection, PE, pneumothorax, esophageal spasm or rupture, chronic angina, pneumonia, bronchitis, GERD, reflux/PUD, biliary disease, pancreatitis, costochondritis, anxiety. Vital signs borderline tachycardia, otherwise unremarkable. Physical exam as noted above.   I independently reviewed and interpreted the patient's labs. BMP shows mild decrease in bicarb, otherwise no electrolyte abnormality.  CBC does show some anemia with hemoglobin of 11 although appears to be improved from her baseline.  hCG is negative.  Troponin at 2 with repeat at 3.  D-dimer at 0.30.  EKG reviewed and interpreted by attending and read as normal sinus rhythm.  Chest x-ray per radiologist read shows no acute cardiopulmonary process.  Although the D-dimer is reassuring, she is still having chest pain that is unresolved and she is on exogenous hormone use and is borderline tachycardic. She does not appear in any acute distress. I do not think this is pericarditis given that she has worsening pain with leaning forward.  She has not had any recent cough or cold symptoms and her troponins are flat.  EKG shows normal sinus rhythm per my attending re.  I discussed with her about ordering CT imaging which she would like to do.  Will handoff to oncoming shift for CT imaging.  CT Angio pending.    3:56 PM Care of Khristi Schiller  transferred to [facility] at the end of my shift as the patient will require reassessment once labs/imaging have resulted. Patient presentation, ED course, and plan of care discussed with review of all pertinent labs and imaging. Please see his/her note for further details regarding further ED course and disposition. Plan at time of handoff is follow up with CT imaging, re-evaluate. This may be altered or completely changed at the discretion of the oncoming team pending results of further workup.  Portions of this report may have been transcribed using voice recognition software. Every effort was made to ensure accuracy; however, inadvertent computerized transcription errors may be present.   Final Clinical Impression(s) / ED Diagnoses Final diagnoses:  None    Rx / DC Orders ED Discharge Orders     None         Achille Rich, PA-C 12/07/22 1712    Elayne Snare K, DO 12/08/22 [phone]

## 2022-12-07 NOTE — Discharge Instructions (Signed)
Please go to the emergency department as soon as you leave urgent care for further evaluation and management. ?

## 2022-12-07 NOTE — Discharge Instructions (Addendum)
Please take tylenol/ibuprofen/naproxen for pain. I recommend close follow-up with cardiology for reevaluation.  Please do not hesitate to return to emergency department if worrisome signs symptoms we discussed become apparent.

## 2022-12-07 NOTE — ED Provider Notes (Signed)
Patient care taken over at shift handoff from outgoing provider.  For full HPI, please refer to previous providers note.  Plan is to reassess after CT angio chest.  Physical Exam  BP 120/81 (BP Location: Right Arm)   Pulse 96   Temp 98.6 F (37 C) (Oral)   Resp 18   Ht 5' (1.524 m)   Wt 82.6 kg   LMP  (LMP Unknown) Comment: takes BC to not have lmp  SpO2 100%   BMI 35.54 kg/m   Physical Exam Vitals and nursing note reviewed.  Constitutional:      Appearance: Normal appearance.  HENT:     Head: Normocephalic and atraumatic.     Mouth/Throat:     Mouth: Mucous membranes are moist.  Eyes:     General: No scleral icterus. Cardiovascular:     Rate and Rhythm: Normal rate and regular rhythm.     Pulses: Normal pulses.     Heart sounds: Normal heart sounds.  Pulmonary:     Effort: Pulmonary effort is normal.     Breath sounds: Normal breath sounds.  Abdominal:     General: Abdomen is flat.     Palpations: Abdomen is soft.     Tenderness: There is no abdominal tenderness.  Musculoskeletal:        General: No deformity.  Skin:    General: Skin is warm.     Findings: No rash.  Neurological:     General: No focal deficit present.     Mental Status: She is alert.  Psychiatric:        Mood and Affect: Mood normal.     Procedures  Procedures  ED Course / MDM    Medical Decision Making Amount and/or Complexity of Data Reviewed Labs: ordered. Radiology: ordered.  Risk Prescription drug management.   42 year old female presents with a chief complaint of chest pain.  On physical examination, patient is afebrile and appears in no acute distress.  CBC with no leukocytosis.  BMP with no evidence of acute electrolyte normalities or AKI. Unlikely ACS/MI, EKG with no ischemic changes, delta troponin is negative.  D-dimer is negative however given patient was tachycardic CT angio chest was ordered which showed no PE.  Chest x-ray showed no evidence of pneumonia or pneumothorax.   Unlikely myocarditis, patient has no recent illness, troponin is negative.  Patient states her chest pain has improved. Heart score is 1 so plan to discharge patient home with cardiology follow-up.  Disposition Continued outpatient therapy. Follow-up with PCP recommended for reevaluation of symptoms. Treatment plan discussed with patient.  Pt acknowledged understanding was agreeable to the plan. Worrisome signs and symptoms were discussed with patient, and patient acknowledged understanding to return to the ED if they noticed these signs and symptoms. Patient was stable upon discharge.   This chart was dictated using voice recognition software.  Despite best efforts to proofread,  errors can occur which can change the documentation meaning.         Jeanelle Malling, PA 12/07/22 2059    Gerhard Munch, MD 12/07/22 [phone]

## 2022-12-14 ENCOUNTER — Other Ambulatory Visit: Payer: Self-pay | Admitting: Family Medicine

## 2022-12-18 MED ORDER — AMPHETAMINE-DEXTROAMPHETAMINE 5 MG PO TABS: 5.0000 mg | ORAL_TABLET | Freq: Every day | ORAL | 0 refills | Status: DC

## 2022-12-18 MED ORDER — AMPHETAMINE-DEXTROAMPHET ER 30 MG PO CP24: 30.0000 mg | ORAL_CAPSULE | ORAL | 0 refills | Status: DC

## 2022-12-21 ENCOUNTER — Ambulatory Visit: Payer: BC Managed Care – PPO | Admitting: Family Medicine

## 2022-12-21 VITALS — BP 115/75 | HR 124 | Temp 98.6°F | Resp 18 | Ht 59.75 in | Wt 186.4 lb

## 2022-12-21 DIAGNOSIS — F902 Attention-deficit hyperactivity disorder, combined type: Secondary | ICD-10-CM

## 2022-12-21 DIAGNOSIS — E66812 Obesity, class 2: Secondary | ICD-10-CM

## 2022-12-21 DIAGNOSIS — E119 Type 2 diabetes mellitus without complications: Secondary | ICD-10-CM | POA: Diagnosis not present

## 2022-12-21 DIAGNOSIS — E6609 Other obesity due to excess calories: Secondary | ICD-10-CM

## 2022-12-21 DIAGNOSIS — Z6836 Body mass index (BMI) 36.0-36.9, adult: Secondary | ICD-10-CM

## 2022-12-21 DIAGNOSIS — Z7984 Long term (current) use of oral hypoglycemic drugs: Secondary | ICD-10-CM

## 2022-12-21 DIAGNOSIS — F32A Depression, unspecified: Secondary | ICD-10-CM

## 2022-12-21 DIAGNOSIS — R142 Eructation: Secondary | ICD-10-CM | POA: Diagnosis not present

## 2022-12-21 LAB — POCT GLYCOSYLATED HEMOGLOBIN (HGB A1C): Hemoglobin A1C: 6.1 % — AB (ref 4.0–5.6)

## 2022-12-21 NOTE — Progress Notes (Unsigned)
Established Patient Office Visit  Subjective    Patient ID: Aimee King, female    DOB: [birth year]  Age: 42 y.o. MRN: [id number]  CC:  Chief Complaint  Patient presents with   Follow-up    3 month    HPI Aimee King presents for follow up of chronic med issues including diabetes, depression,  and hypertension. Patient also has been having some belching and wonders if one of her meds could be causing that sx.   Outpatient Encounter Medications as of 12/21/2022  Medication Sig   albuterol (VENTOLIN HFA) 108 (90 Base) MCG/ACT inhaler Inhale into the lungs every 6 (six) hours as needed for wheezing or shortness of breath.   amphetamine-dextroamphetamine (ADDERALL XR) 30 MG 24 hr capsule Take 1 capsule (30 mg total) by mouth every morning.   amphetamine-dextroamphetamine (ADDERALL) 5 MG tablet Take 1 tablet (5 mg total) by mouth daily.   buPROPion (WELLBUTRIN XL) 150 MG 24 hr tablet TAKE 1 TABLET BY MOUTH EVERY DAY   Ferric Maltol (ACCRUFER) 30 MG CAPS Take 1 capsule (30 mg total) by mouth 2 (two) times daily before a meal. Take 2 hrs before, or 2 hrs after a meal.   fexofenadine (ALLEGRA) 180 MG tablet TAKE 1 TABLET BY MOUTH EVERY DAY   glipiZIDE (GLUCOTROL) 5 MG tablet TAKE 1 TABLET (5 MG TOTAL) BY MOUTH TWICE A DAY BEFORE MEALS   glipiZIDE (GLUCOTROL) 5 MG tablet Take 1 tablet (5 mg total) by mouth 2 (two) times daily before a meal.   megestrol (MEGACE) 40 MG tablet TAKE 2 TABLETS BY MOUTH TWICE A DAY   metFORMIN (GLUCOPHAGE-XR) 750 MG 24 hr tablet TAKE 1 TABLET BY MOUTH EVERY DAY WITH BREAKFAST   metFORMIN (GLUCOPHAGE-XR) 750 MG 24 hr tablet Take 1 tablet (750 mg total) by mouth daily with breakfast.   naproxen (NAPROSYN) 500 MG tablet Take 1 tablet (500 mg total) by mouth 2 (two) times daily.   [DISCONTINUED] amoxicillin-clavulanate (AUGMENTIN) 875-125 MG tablet Take 1 tablet by mouth 2 (two) times daily.   [DISCONTINUED] buPROPion (WELLBUTRIN XL) 150 MG 24  hr tablet Take 1 tablet (150 mg total) by mouth daily.   [DISCONTINUED] ibuprofen (ADVIL) 800 MG tablet Take 1 tablet (800 mg total) by mouth every 8 (eight) hours as needed.   No facility-administered encounter medications on file as of 12/21/2022.    Past Medical History:  Diagnosis Date   Acid reflux    ADD (attention deficit disorder)    Allergy    Anemia    Attention deficit disorder (ADD)    Depression    Endometriosis    IBS (irritable bowel syndrome)     Past Surgical History:  Procedure Laterality Date   CESAREAN SECTION  2004, 2008   CESAREAN SECTION     CHOLECYSTECTOMY  02/23/2002   CHOLECYSTECTOMY     LAPAROSCOPY  02/24/2000   endometriosis   TUBAL LIGATION  02/23/2006   TUBAL LIGATION     tubal ligation reversal Right     Family History  Problem Relation Age of Onset   Hypertension Mother    Diabetes Mother    Hypertension Father    Heart disease Father    Diabetes Father    Diabetes Maternal Grandmother    Kidney disease Maternal Grandfather    Kidney disease Maternal Uncle    Colon cancer Neg Hx    Esophageal cancer Neg Hx    Rectal cancer Neg Hx    Stomach cancer Neg  Hx     Social History   Socioeconomic History   Marital status: Married    Spouse name: Not on file   Number of children: 3   Years of education: Not on file   Highest education level: Master's degree (e.g., MA, MS, MEng, MEd, MSW, MBA)  Occupational History   Occupation: educator   Tobacco Use   Smoking status: Never   Smokeless tobacco: Never  Vaping Use   Vaping status: Never Used  Substance and Sexual Activity   Alcohol use: Yes    Alcohol/week: 0.0 standard drinks of alcohol    Comment: ocassionally   Drug use: No   Sexual activity: Yes    Partners: Male    Birth control/protection: None  Other Topics Concern   Not on file  Social History Narrative   ** Merged History Encounter **       Social Determinants of Health   Financial Resource Strain: Low Risk   (12/21/2022)   Overall Financial Resource Strain (CARDIA)    Difficulty of Paying Living Expenses: Not very hard  Food Insecurity: Food Insecurity Present (12/21/2022)   Hunger Vital Sign    Worried About Running Out of Food in the Last Year: Sometimes true    Ran Out of Food in the Last Year: Never true  Transportation Needs: No Transportation Needs (12/21/2022)   PRAPARE - Administrator, Civil Service (Medical): No    Lack of Transportation (Non-Medical): No  Physical Activity: Unknown (12/21/2022)   Exercise Vital Sign    Days of Exercise per Week: 0 days    Minutes of Exercise per Session: Not on file  Stress: No Stress Concern Present (12/21/2022)   [employer] of Occupational Health - Occupational Stress Questionnaire    Feeling of Stress : Only a little  Recent Concern: Stress - Stress Concern Present (11/09/2022)   [employer] of Occupational Health - Occupational Stress Questionnaire    Feeling of Stress : To some extent  Social Connections: Socially Integrated (12/21/2022)   Social Connection and Isolation Panel [NHANES]    Frequency of Communication with Friends and Family: Three times a week    Frequency of Social Gatherings with Friends and Family: Once a week    Attends Religious Services: 1 to 4 times per year    Active Member of [employer] or Organizations: Yes    Attends Banker Meetings: 1 to 4 times per year    Marital Status: Married  Catering manager Violence: Not At Risk (11/09/2022)   Humiliation, Afraid, Rape, and Kick questionnaire    Fear of Current or Ex-Partner: No    Emotionally Abused: No    Physically Abused: No    Sexually Abused: No    Review of Systems  All other systems reviewed and are negative.       Objective    BP 115/75 (BP Location: Right Arm, Patient Position: Sitting, Cuff Size: Large)   Pulse (!) 124   Temp 98.6 F (37 C) (Oral)   Resp 18   Ht 4' 11.75" (1.518 m)   Wt 186 lb 6.4 oz (84.6 kg)    LMP  (LMP Unknown) Comment: takes BC to not have lmp  SpO2 98%   BMI 36.71 kg/m   Physical Exam Vitals and nursing note reviewed.  Constitutional:      General: She is not in acute distress.    Appearance: She is obese.  Cardiovascular:     Rate and Rhythm: Normal  rate and regular rhythm.  Pulmonary:     Effort: Pulmonary effort is normal.     Breath sounds: Normal breath sounds.  Abdominal:     Palpations: Abdomen is soft.     Tenderness: There is no abdominal tenderness.  Neurological:     General: No focal deficit present.     Mental Status: She is alert and oriented to person, place, and time.         Assessment & Plan:   1. Type 2 diabetes mellitus without complication, without long-term current use of insulin (HCC) A1c is improved and now at goal. Continue  - HgB A1c  2. Depression, unspecified depression type Appears stable. Continue   3. Attention deficit hyperactivity disorder (ADHD), combined type Appears stable. continue  4. Class 2 obesity due to excess calories without serious comorbidity with body mass index (BMI) of 36.0 to 36.9 in adult   5. Belching Most likely 2/2 metformin. Patient deferred stopping agent  6. Diabetes mellitus treated with oral medication (HCC)     Return in about 3 months (around 03/23/2023) for follow up, physical.   [name], MD

## 2022-12-23 ENCOUNTER — Encounter: Payer: Self-pay | Admitting: Family Medicine

## 2023-01-07 IMAGING — US US PELVIS COMPLETE WITH TRANSVAGINAL
1 series · 13 of 25 positions shown · non-contrast
Comparison: 05/23/2009

CLINICAL DATA: Abnormal uterine bleeding, last menstrual cycle
lasted 24 days duration, LMP 06/16/2021



[Series 1: us pelvis complete with transvaginal · 0.22mm/px · 13 of 52 slices shown]
[im 1/52]
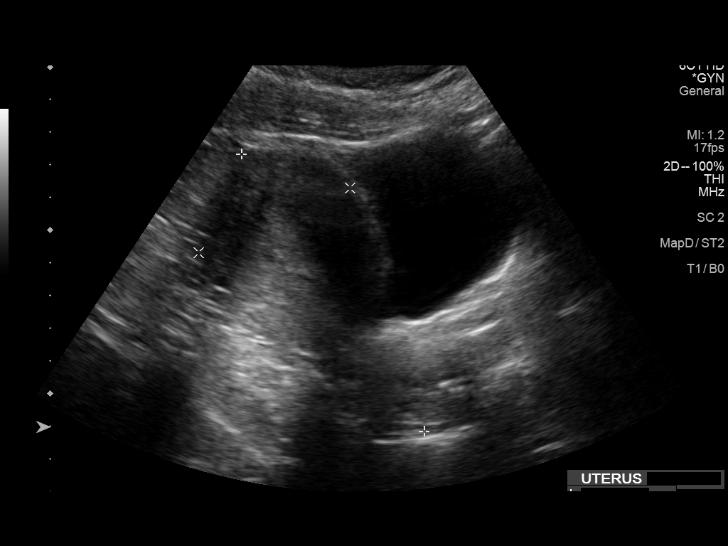
[im 5/52]
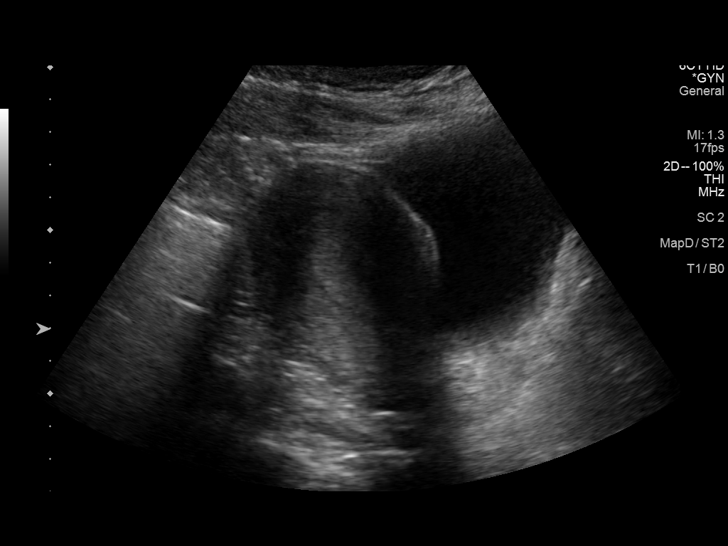
[im 9/52]
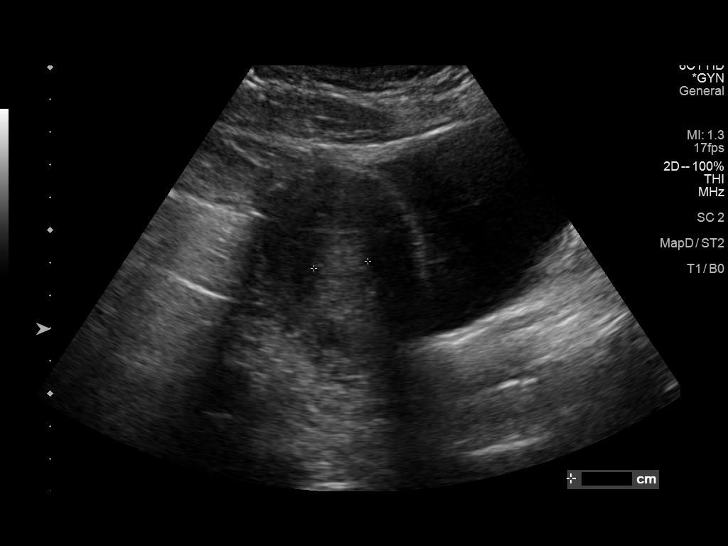
[im 13/52]
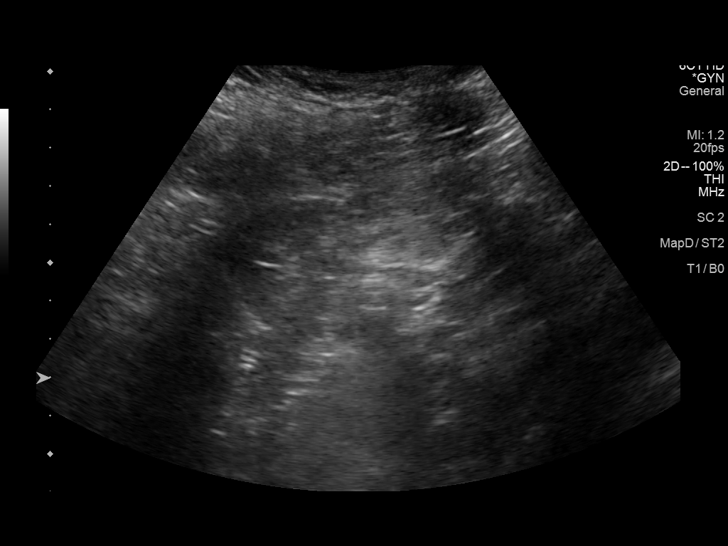
[im 18/52]
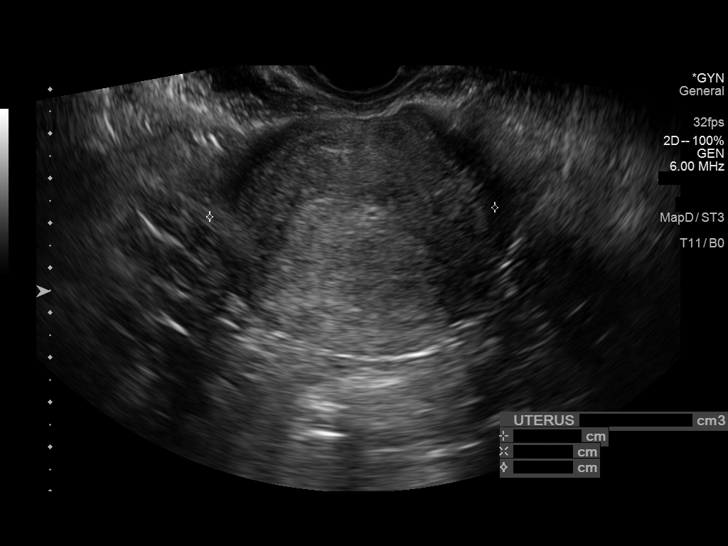
[im 22/52]
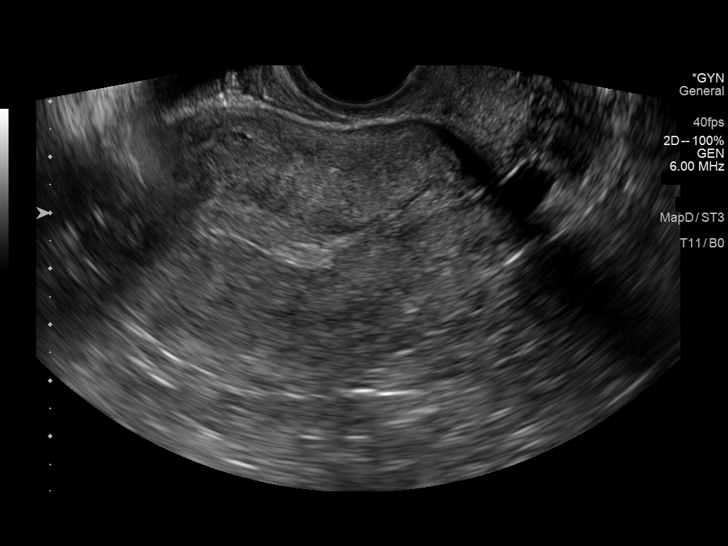
[im 26/52]
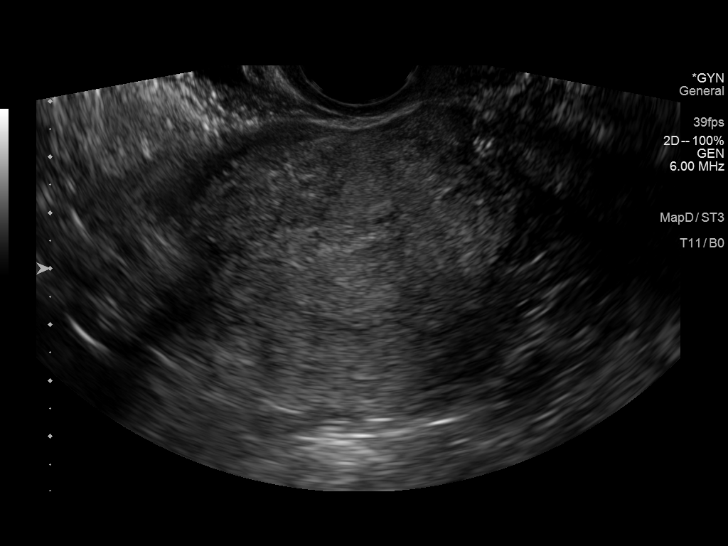
[im 30/52]
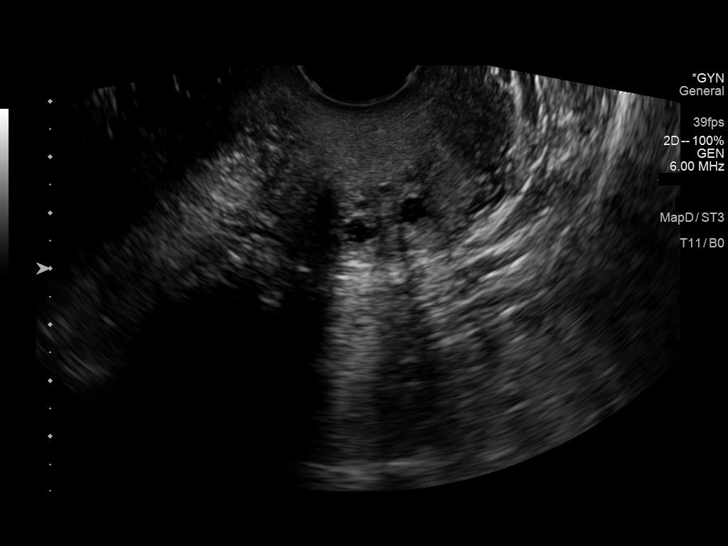
[im 35/52]
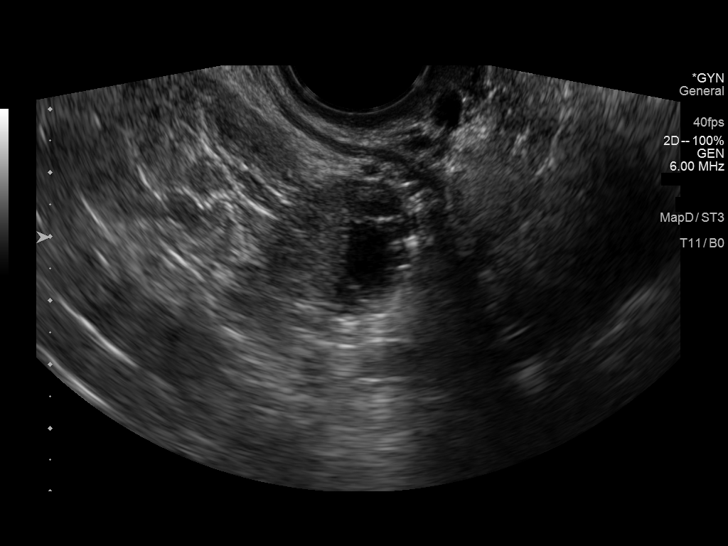
[im 39/52]
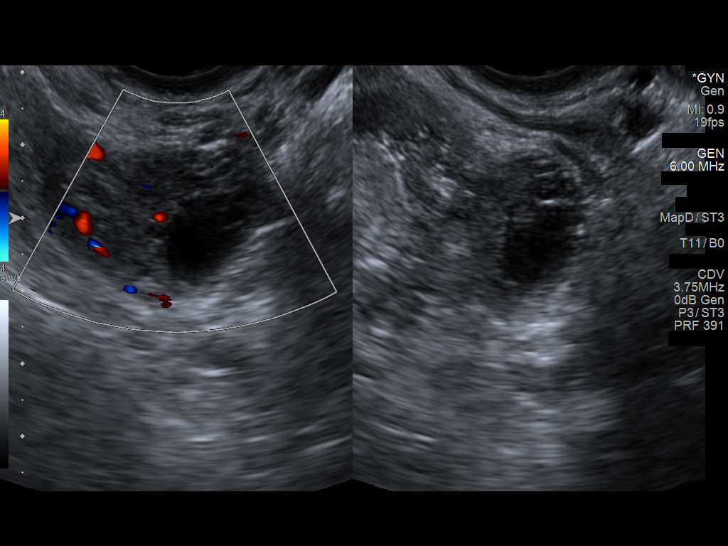
[im 43/52]
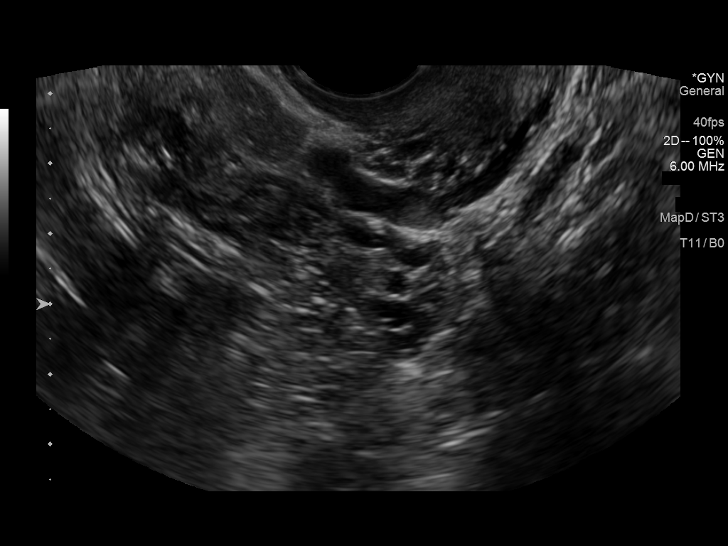
[im 47/52]
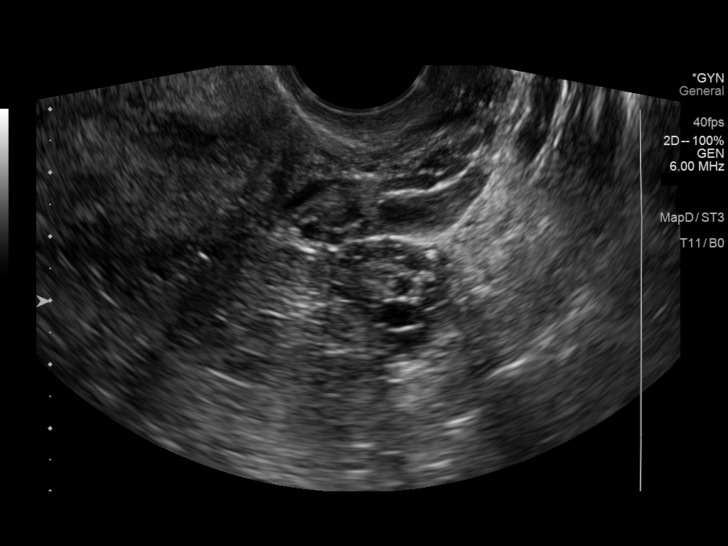
[im 52/52]
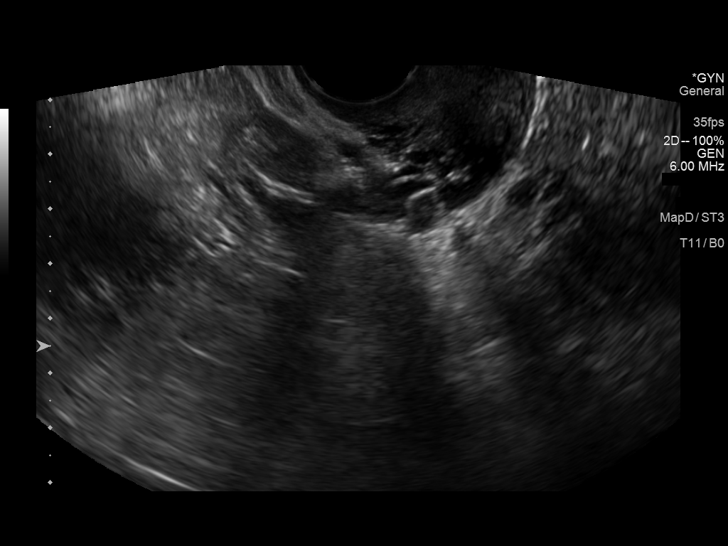

[13 of 25 positions shown; findings below may reference images not displayed]

FINDINGS: Uterus

Measurements: 10.1 x 5.0 x 6.0 cm = volume: 162 mL. Anteverted.
Anterior wall Caesarean section scar. Heterogeneous myometrium. No
focal mass.

Endometrium

Thickness: 24 mm. Thickened, slightly heterogeneous, ill-defined. No
discrete mass or fluid.

Right ovary

Measurements: 2.5 x 2.2 x 1.7 cm = volume: 5.0 mL. Normal morphology
without mass

Left ovary

Measurements: 1.9 x 2.0 x 1.6 cm = volume: 3 mL. Normal morphology
without mass

Other findings

No free pelvic fluid or adnexal masses.
IMPRESSION: Thickened endometrial complex 24 mm thick; if bleeding remains
unresponsive to hormonal or medical therapy, focal lesion work-up
with sonohysterogram should be considered. Endometrial biopsy should
also be considered in pre-menopausal patients at high risk for
endometrial carcinoma. (Ref: Radiological Reasoning: Algorithmic
Workup of Abnormal Vaginal Bleeding with Endovaginal Sonography and
Sonohysterography. AJR [id number]; [id number])

Remainder of exam unremarkable.

## 2023-01-22 ENCOUNTER — Other Ambulatory Visit: Payer: Self-pay | Admitting: Family Medicine

## 2023-01-26 MED ORDER — AMPHETAMINE-DEXTROAMPHET ER 30 MG PO CP24: 30.0000 mg | ORAL_CAPSULE | ORAL | 0 refills | Status: DC

## 2023-02-18 ENCOUNTER — Other Ambulatory Visit: Payer: Self-pay | Admitting: Family Medicine

## 2023-02-18 DIAGNOSIS — F32A Depression, unspecified: Secondary | ICD-10-CM

## 2023-02-18 DIAGNOSIS — E119 Type 2 diabetes mellitus without complications: Secondary | ICD-10-CM

## 2023-03-04 ENCOUNTER — Other Ambulatory Visit: Payer: Self-pay | Admitting: Family Medicine

## 2023-03-12 ENCOUNTER — Encounter: Payer: Self-pay | Admitting: Family Medicine

## 2023-03-12 MED ORDER — AMPHETAMINE-DEXTROAMPHETAMINE 5 MG PO TABS: 5.0000 mg | ORAL_TABLET | Freq: Every day | ORAL | 0 refills | Status: DC

## 2023-03-12 MED ORDER — AMPHETAMINE-DEXTROAMPHET ER 30 MG PO CP24: 30.0000 mg | ORAL_CAPSULE | ORAL | 0 refills | Status: DC

## 2023-03-24 ENCOUNTER — Ambulatory Visit (INDEPENDENT_AMBULATORY_CARE_PROVIDER_SITE_OTHER): Payer: 59 | Admitting: Family Medicine

## 2023-03-24 ENCOUNTER — Encounter: Payer: Self-pay | Admitting: Family Medicine

## 2023-03-24 VITALS — BP 126/87 | HR 103 | Temp 98.7°F | Resp 18 | Ht 60.0 in | Wt 190.6 lb

## 2023-03-24 DIAGNOSIS — E66812 Obesity, class 2: Secondary | ICD-10-CM | POA: Diagnosis not present

## 2023-03-24 DIAGNOSIS — F902 Attention-deficit hyperactivity disorder, combined type: Secondary | ICD-10-CM | POA: Diagnosis not present

## 2023-03-24 DIAGNOSIS — E6609 Other obesity due to excess calories: Secondary | ICD-10-CM

## 2023-03-24 DIAGNOSIS — Z6836 Body mass index (BMI) 36.0-36.9, adult: Secondary | ICD-10-CM

## 2023-03-24 DIAGNOSIS — F32A Depression, unspecified: Secondary | ICD-10-CM

## 2023-03-24 DIAGNOSIS — Z7984 Long term (current) use of oral hypoglycemic drugs: Secondary | ICD-10-CM | POA: Diagnosis not present

## 2023-03-24 DIAGNOSIS — E119 Type 2 diabetes mellitus without complications: Secondary | ICD-10-CM

## 2023-03-24 LAB — POCT GLYCOSYLATED HEMOGLOBIN (HGB A1C): Hemoglobin A1C: 8.2 % — AB (ref 4.0–5.6)

## 2023-03-24 MED ORDER — BUPROPION HCL ER (XL) 300 MG PO TB24: 300.0000 mg | ORAL_TABLET | Freq: Every day | ORAL | 0 refills | Status: DC

## 2023-03-24 MED ORDER — METFORMIN HCL ER 750 MG PO TB24: 750.0000 mg | ORAL_TABLET | Freq: Every day | ORAL | 1 refills | Status: DC

## 2023-03-24 MED ORDER — GLIPIZIDE ER 10 MG PO TB24: 10.0000 mg | ORAL_TABLET | Freq: Every day | ORAL | 1 refills | Status: DC

## 2023-03-24 NOTE — Progress Notes (Unsigned)
Established Patient Office Visit  Subjective    Patient ID: Aimee King, female    DOB: [birth year]  Age: 43 y.o. MRN: [id number]  CC:  Chief Complaint  Patient presents with   Annual Exam    HPI Aimee King presents for routine follow up of chronic med issues including, diabetes, ADHD, and depression. Patient reports that she has been struggling mentally/emotionally over the past 2-3 months.   Outpatient Encounter Medications as of 03/24/2023  Medication Sig   albuterol (VENTOLIN HFA) 108 (90 Base) MCG/ACT inhaler Inhale into the lungs every 6 (six) hours as needed for wheezing or shortness of breath.   amphetamine-dextroamphetamine (ADDERALL XR) 30 MG 24 hr capsule Take 1 capsule (30 mg total) by mouth every morning.   amphetamine-dextroamphetamine (ADDERALL) 5 MG tablet Take 1 tablet (5 mg total) by mouth daily.   buPROPion (WELLBUTRIN XL) 150 MG 24 hr tablet TAKE 1 TABLET BY MOUTH EVERY DAY   buPROPion (WELLBUTRIN XL) 300 MG 24 hr tablet Take 1 tablet (300 mg total) by mouth daily.   fexofenadine (ALLEGRA) 180 MG tablet TAKE 1 TABLET BY MOUTH EVERY DAY   glipiZIDE (GLUCOTROL XL) 10 MG 24 hr tablet Take 1 tablet (10 mg total) by mouth daily with breakfast.   megestrol (MEGACE) 40 MG tablet TAKE 2 TABLETS BY MOUTH TWICE A DAY   metFORMIN (GLUCOPHAGE-XR) 750 MG 24 hr tablet TAKE 1 TABLET BY MOUTH EVERY DAY WITH BREAKFAST   naproxen (NAPROSYN) 500 MG tablet Take 1 tablet (500 mg total) by mouth 2 (two) times daily.   [DISCONTINUED] metFORMIN (GLUCOPHAGE-XR) 750 MG 24 hr tablet TAKE 1 TABLET BY MOUTH EVERY DAY WITH BREAKFAST   Ferric Maltol (ACCRUFER) 30 MG CAPS Take 1 capsule (30 mg total) by mouth 2 (two) times daily before a meal. Take 2 hrs before, or 2 hrs after a meal.   glipiZIDE (GLUCOTROL) 5 MG tablet TAKE 1 TABLET (5 MG TOTAL) BY MOUTH TWICE A DAY BEFORE MEALS (Patient not taking: Reported on 03/24/2023)   glipiZIDE (GLUCOTROL) 5 MG tablet TAKE 1 TABLET  (5 MG TOTAL) BY MOUTH TWICE A DAY BEFORE MEALS (Patient not taking: Reported on 03/24/2023)   metFORMIN (GLUCOPHAGE-XR) 750 MG 24 hr tablet Take 1 tablet (750 mg total) by mouth daily with breakfast.   No facility-administered encounter medications on file as of 03/24/2023.    Past Medical History:  Diagnosis Date   Acid reflux    ADD (attention deficit disorder)    Allergy    Anemia    Attention deficit disorder (ADD)    Depression    Endometriosis    IBS (irritable bowel syndrome)     Past Surgical History:  Procedure Laterality Date   CESAREAN SECTION  2004, 2008   CESAREAN SECTION     CHOLECYSTECTOMY  02/23/2002   CHOLECYSTECTOMY     LAPAROSCOPY  02/24/2000   endometriosis   TUBAL LIGATION  02/23/2006   TUBAL LIGATION     tubal ligation reversal Right     Family History  Problem Relation Age of Onset   Hypertension Mother    Diabetes Mother    Hypertension Father    Heart disease Father    Diabetes Father    Diabetes Maternal Grandmother    Kidney disease Maternal Grandfather    Kidney disease Maternal Uncle    Colon cancer Neg Hx    Esophageal cancer Neg Hx    Rectal cancer Neg Hx    Stomach cancer Neg  Hx     Social History   Socioeconomic History   Marital status: Married    Spouse name: Not on file   Number of children: 3   Years of education: Not on file   Highest education level: Master's degree (e.g., MA, MS, MEng, MEd, MSW, MBA)  Occupational History   Occupation: educator   Tobacco Use   Smoking status: Never   Smokeless tobacco: Never  Vaping Use   Vaping status: Never Used  Substance and Sexual Activity   Alcohol use: Yes    Alcohol/week: 0.0 standard drinks of alcohol    Comment: ocassionally   Drug use: No   Sexual activity: Yes    Partners: Male    Birth control/protection: None  Other Topics Concern   Not on file  Social History Narrative   ** Merged History Encounter **       Social Drivers of Health   Financial Resource  Strain: Low Risk  (12/21/2022)   Overall Financial Resource Strain (CARDIA)    Difficulty of Paying Living Expenses: Not very hard  Food Insecurity: Food Insecurity Present (12/21/2022)   Hunger Vital Sign    Worried About Running Out of Food in the Last Year: Sometimes true    Ran Out of Food in the Last Year: Never true  Transportation Needs: No Transportation Needs (12/21/2022)   PRAPARE - Administrator, Civil Service (Medical): No    Lack of Transportation (Non-Medical): No  Physical Activity: Unknown (12/21/2022)   Exercise Vital Sign    Days of Exercise per Week: 0 days    Minutes of Exercise per Session: Not on file  Stress: No Stress Concern Present (12/21/2022)   [employer] of Occupational Health - Occupational Stress Questionnaire    Feeling of Stress : Only a little  Recent Concern: Stress - Stress Concern Present (11/09/2022)   [employer] of Occupational Health - Occupational Stress Questionnaire    Feeling of Stress : To some extent  Social Connections: Socially Integrated (12/21/2022)   Social Connection and Isolation Panel [NHANES]    Frequency of Communication with Friends and Family: Three times a week    Frequency of Social Gatherings with Friends and Family: Once a week    Attends Religious Services: 1 to 4 times per year    Active Member of [employer] or Organizations: Yes    Attends Banker Meetings: 1 to 4 times per year    Marital Status: Married  Catering manager Violence: Not At Risk (11/09/2022)   Humiliation, Afraid, Rape, and Kick questionnaire    Fear of Current or Ex-Partner: No    Emotionally Abused: No    Physically Abused: No    Sexually Abused: No    Review of Systems  Psychiatric/Behavioral:  Positive for depression. Negative for suicidal ideas.   All other systems reviewed and are negative.       Objective    BP 126/87 (BP Location: Right Arm, Patient Position: Sitting, Cuff Size: Normal)   Pulse (!)  103   Temp 98.7 F (37.1 C) (Oral)   Resp 18   Ht 5' (1.524 m)   Wt 190 lb 9.6 oz (86.5 kg)   SpO2 97%   BMI 37.22 kg/m   Physical Exam Vitals and nursing note reviewed.  Constitutional:      General: She is not in acute distress.    Appearance: She is obese.  Cardiovascular:     Rate and Rhythm: Normal rate and  regular rhythm.  Pulmonary:     Effort: Pulmonary effort is normal.     Breath sounds: Normal breath sounds.  Abdominal:     Palpations: Abdomen is soft.     Tenderness: There is no abdominal tenderness.  Neurological:     General: No focal deficit present.     Mental Status: She is alert and oriented to person, place, and time.  Psychiatric:        Mood and Affect: Affect normal. Mood is depressed.        Behavior: Behavior normal.         Assessment & Plan:   1. Type 2 diabetes mellitus without complication, without long-term current use of insulin (HCC) (Primary) Increased A1c and no longer at goal.  Glipizide added to regimen  2. Diabetes mellitus treated with oral medication (HCC)  - Urine Albumin/Creatinine with ratio (send out) [LAB689] - HgB A1c  3. Depression, unspecified depression type Wellbutrin increased from 150 to 300mg  daily.   4. Attention deficit hyperactivity disorder (ADHD), combined type Continue   5. Class 2 obesity due to excess calories without serious comorbidity with body mass index (BMI) of 36.0 to 36.9 in adult     Return in about 3 months (around 06/22/2023) for follow up, chronic med issues.   [name], MD

## 2023-03-25 ENCOUNTER — Other Ambulatory Visit: Payer: Self-pay

## 2023-03-25 ENCOUNTER — Encounter: Payer: Self-pay | Admitting: Pharmacist

## 2023-03-25 ENCOUNTER — Encounter: Payer: Self-pay | Admitting: Family Medicine

## 2023-03-25 ENCOUNTER — Ambulatory Visit: Payer: 59 | Attending: Family Medicine | Admitting: Pharmacist

## 2023-03-25 ENCOUNTER — Telehealth: Payer: Self-pay | Admitting: Pharmacist

## 2023-03-25 DIAGNOSIS — Z7985 Long-term (current) use of injectable non-insulin antidiabetic drugs: Secondary | ICD-10-CM | POA: Diagnosis not present

## 2023-03-25 DIAGNOSIS — E119 Type 2 diabetes mellitus without complications: Secondary | ICD-10-CM

## 2023-03-25 DIAGNOSIS — Z7984 Long term (current) use of oral hypoglycemic drugs: Secondary | ICD-10-CM

## 2023-03-25 LAB — POCT GLYCOSYLATED HEMOGLOBIN (HGB A1C): HbA1c, POC (controlled diabetic range): 8.2 % — AB (ref 0.0–7.0)

## 2023-03-25 LAB — MICROALBUMIN / CREATININE URINE RATIO
Creatinine, Urine: 116.9 mg/dL
Microalb/Creat Ratio: 351 mg/g{creat} — ABNORMAL HIGH (ref 0–29)
Microalbumin, Urine: 410.3 ug/mL

## 2023-03-25 MED ORDER — TRULICITY 0.75 MG/0.5ML ~~LOC~~ SOAJ
0.7500 mg | SUBCUTANEOUS | 0 refills | Status: DC
Start: 2023-03-25 — End: 2023-04-13
  Filled 2023-03-25: qty 2, 28d supply, fill #0

## 2023-03-25 NOTE — Telephone Encounter (Signed)
LIBERATE Study  Received referral for patient participation in the LIBERATE CGM Study. Contacted patient to discuss study and confirmed HIPAA identifiers. Confirmed patient was provided the LIBERATE Study Information Sheet and any questions were answered.   Confirmed that patient meets study criteria by having a diagnosis of Type 2 Diabetes, is not currently on insulin, and most recent A1c is >8%.  Patient provided verbal consent to participate in the study. Consent documented in electronic medical record.   - Confirmed that patient has a compatible smart phone to download Floydada 3 app. ([email or web address]) - Asked to download and create a Josephine Igo account prior to first study visit.  - Scheduled first study visit today. Confirmed patient has transportation to this appointment.  - Discussed use of MyChart in this study. Confirmed patient has an active MyChart account and is aware of their log in information.  Patient aware of pre-visit questionnaire that will be sent 2 days prior to their scheduled study visit and they will plan to complete before the visit.    Butch Penny, PharmD, [place], CPP Clinical Pharmacist [facility] & [facility] [phone]

## 2023-03-25 NOTE — Progress Notes (Signed)
S:     No chief complaint on file.  43 y.o. female who presents for diabetes evaluation, education, and management in the context of the LIBERATE study. Patient arrives in good spirits and presents without any assistance.   Patient was referred and last seen by Primary Care Provider, Dr. Andrey Campanile, on 03/24/2023.   PMH is significant for T2DM, GERD, obesity, endometriosis, vit d deficiency, menorrhagia with hx of iron deficiency anemia. Denies any history of clinical ASCVD, CKD, or CHF. No hx of pancreatitis, thyroid cancer. She has tried Ozempic before but could only tolerate the 0.5 mg weekly dose. It helped bring her A1c down but she was unable to lose weight. Additionally, it caused belching and GI distress. Patient reports Diabetes was diagnosed in 07/2022. She noticed symptoms and requested her A1c checked as she has DM in her family.    Family/Social History:  Fhx: maternal aunt, mother, MGM have DM, HTN, obesity, kidney disease  Tobacco: never smoker Alcohol: none reported  Current diabetes medications include: metformin XR 750 mg daily, glipizide 10 mg XL in the AM  Patient reports adherence to taking all medications as prescribed.   Insurance coverage: Aetna  Patient denies hypoglycemic events.  Patient denies nocturia (nighttime urination).  Patient denies neuropathy (nerve pain). Patient denies visual changes. Patient reports self foot exams.   Patient reported dietary habits:  -Limits starches and sugars during the day but admits that she craves sweets in the evening   Patient-reported exercise habits:  -None reported; limited right now    O:    Lab Results  Component Value Date   HGBA1C 8.2 (A) 03/24/2023   There were no vitals filed for this visit.  Lipid Panel     Component Value Date/Time   CHOL 181 12/26/2018 1010   TRIG 152 (H) 12/26/2018 1010   HDL 43 12/26/2018 1010   CHOLHDL 4.2 12/26/2018 1010   CHOLHDL 3.3 10/05/2012 1345   VLDL 16  10/05/2012 1345   LDLCALC 111 (H) 12/26/2018 1010    Clinical Atherosclerotic Cardiovascular Disease (ASCVD): No  The ASCVD Risk score (Arnett DK, et al., 2019) failed to calculate for the following reasons:   Cannot find a previous HDL lab   Cannot find a previous total cholesterol lab   Patient is participating in a Managed Medicaid Plan: No   A/P: LIBERATE Study:  -Patient provided verbal consent to participate in the study. Consent documented in electronic medical record.  -Provided education on Libre 3 CGM. Collaborated to ensure Josephine Igo 3 app was downloaded on patient's phone. Educated on how to place sensor every 14 days, patient placed first sensor correctly and verbalized understanding of use, removal, and how to place next sensor. Discussed alarms. 8 sensors provided for a 3 month supply. Educated to contact the office if the sensor falls off early and replacements are needed before their next [employer].   Diabetes longstanding currently just above goal. Patient is able to verbalize appropriate hypoglycemia management plan. Medication adherence appears to be optimal. -Continued metformin and glipizide at current doses for now.  -Start Trulicity 0.75 mg weekly. If she cannot tolerate this in the future, we can try PO Rybelsus or Mounjaro if her insurance approves.  -Patient was educated on the use of the Trulicity pen. Reviewed necessary supplies and operation of the meter.  -Patient educated on purpose, proper use, and potential adverse effects of Trulicity.  -Extensively discussed pathophysiology of diabetes, recommended lifestyle interventions, dietary effects on blood sugar  control.  -Counseled on s/sx of and management of hypoglycemia.  -Next A1c anticipated 05/2023.   Written patient instructions provided. Patient verbalized understanding of treatment plan.  Total time in face to face counseling 30 minutes.    Follow-up:  Pharmacist in 1 month  Butch Penny,  PharmD, [place], CPP Clinical Pharmacist [facility] & [facility] [phone]

## 2023-03-25 NOTE — Telephone Encounter (Signed)
Can we start a PA for this patient's Trulicity?

## 2023-03-26 ENCOUNTER — Other Ambulatory Visit: Payer: Self-pay

## 2023-03-26 ENCOUNTER — Other Ambulatory Visit: Payer: Self-pay | Admitting: Pharmacist

## 2023-03-26 MED ORDER — LOSARTAN POTASSIUM 25 MG PO TABS
25.0000 mg | ORAL_TABLET | Freq: Every day | ORAL | 0 refills | Status: DC
  Filled 2023-03-26 – 2023-06-17 (×3): qty 90, 90d supply, fill #0

## 2023-03-26 MED ORDER — LOSARTAN POTASSIUM 25 MG PO TABS: 25.0000 mg | ORAL_TABLET | Freq: Every day | ORAL | 0 refills | Status: DC

## 2023-03-26 NOTE — Telephone Encounter (Signed)
I reviewed the recommendations you provided to the patient, but she has additional questions regarding her lab results. She wants to know if the results indicate kidney damage or if her medication regimen needs to be adjusted.  Please advise whether she should schedule another appointment or if you are able to message her directly to address these concerns. Let me know how you'd like to proceed.  Thank you!

## 2023-03-29 ENCOUNTER — Other Ambulatory Visit: Payer: Self-pay

## 2023-04-02 ENCOUNTER — Telehealth: Payer: Self-pay | Admitting: Pharmacist

## 2023-04-02 NOTE — Telephone Encounter (Signed)
 LIBERATE Study  Patient completed first study visit for the LIBERATE CGM Study. Contacted patient to discuss CGM tolerability. Pt was unavailable so left a HIPAA-compliant VM with instructions to return my call.   Aimee King, PharmD, [name], CPP Clinical Pharmacist [facility] & [facility] [phone]

## 2023-04-05 NOTE — Telephone Encounter (Signed)
 Called patient left voicemail to call office so we can schedule video visit

## 2023-04-13 ENCOUNTER — Other Ambulatory Visit: Payer: Self-pay | Admitting: Pharmacist

## 2023-04-13 ENCOUNTER — Other Ambulatory Visit: Payer: Self-pay

## 2023-04-13 MED ORDER — TRULICITY 0.75 MG/0.5ML ~~LOC~~ SOAJ: 0.7500 mg | SUBCUTANEOUS | 1 refills | Status: DC

## 2023-04-13 MED ORDER — TRULICITY 0.75 MG/0.5ML ~~LOC~~ SOAJ
0.7500 mg | SUBCUTANEOUS | 1 refills | Status: DC
  Filled 2023-04-13 – 2023-04-20 (×3): qty 2, 28d supply, fill #0

## 2023-04-13 MED ORDER — GLIPIZIDE ER 5 MG PO TB24: 5.0000 mg | ORAL_TABLET | Freq: Every day | ORAL | 0 refills | Status: DC

## 2023-04-19 ENCOUNTER — Other Ambulatory Visit: Payer: Self-pay | Admitting: Family Medicine

## 2023-04-19 ENCOUNTER — Other Ambulatory Visit: Payer: Self-pay

## 2023-04-20 ENCOUNTER — Other Ambulatory Visit: Payer: Self-pay

## 2023-04-23 ENCOUNTER — Other Ambulatory Visit: Payer: Self-pay | Admitting: Family Medicine

## 2023-04-24 ENCOUNTER — Other Ambulatory Visit: Payer: Self-pay | Admitting: Family Medicine

## 2023-04-24 DIAGNOSIS — R9389 Abnormal findings on diagnostic imaging of other specified body structures: Secondary | ICD-10-CM

## 2023-04-26 ENCOUNTER — Encounter: Payer: Self-pay | Admitting: Family Medicine

## 2023-04-26 ENCOUNTER — Other Ambulatory Visit: Payer: Self-pay | Admitting: Family Medicine

## 2023-04-26 ENCOUNTER — Other Ambulatory Visit: Payer: Self-pay

## 2023-04-26 MED ORDER — AMPHETAMINE-DEXTROAMPHETAMINE 5 MG PO TABS: 5.0000 mg | ORAL_TABLET | Freq: Every day | ORAL | 0 refills | Status: DC

## 2023-04-26 MED ORDER — AMPHETAMINE-DEXTROAMPHET ER 30 MG PO CP24: 30.0000 mg | ORAL_CAPSULE | ORAL | 0 refills | Status: DC

## 2023-04-29 ENCOUNTER — Other Ambulatory Visit: Payer: Self-pay

## 2023-04-29 ENCOUNTER — Encounter: Payer: Self-pay | Admitting: Pharmacist

## 2023-04-29 ENCOUNTER — Ambulatory Visit: Payer: 59 | Attending: Family Medicine | Admitting: Pharmacist

## 2023-04-29 DIAGNOSIS — Z7984 Long term (current) use of oral hypoglycemic drugs: Secondary | ICD-10-CM | POA: Diagnosis not present

## 2023-04-29 DIAGNOSIS — E119 Type 2 diabetes mellitus without complications: Secondary | ICD-10-CM

## 2023-04-29 DIAGNOSIS — Z7985 Long-term (current) use of injectable non-insulin antidiabetic drugs: Secondary | ICD-10-CM

## 2023-04-29 MED ORDER — TRULICITY 1.5 MG/0.5ML ~~LOC~~ SOAJ
1.5000 mg | SUBCUTANEOUS | 6 refills | Status: DC
  Filled 2023-04-29 – 2023-05-20 (×2): qty 2, 28d supply, fill #0
  Filled 2023-06-17: qty 2, 28d supply, fill #1

## 2023-04-29 MED ORDER — BUDESONIDE-FORMOTEROL FUMARATE 80-4.5 MCG/ACT IN AERO
2.0000 | INHALATION_SPRAY | Freq: Two times a day (BID) | RESPIRATORY_TRACT | 3 refills | Status: DC
  Filled 2023-04-29: qty 10.2, 30d supply, fill #0
  Filled 2023-05-27: qty 10.2, 30d supply, fill #1
  Filled 2023-09-25: qty 10.2, 30d supply, fill #2
  Filled 2023-10-24: qty 10.2, 30d supply, fill #3

## 2023-04-29 NOTE — Progress Notes (Signed)
 S:     No chief complaint on file.  43 y.o. female who presents for diabetes evaluation, education, and management. Of note, we also have her enrolled in our LIBERATE study. Patient arrives in good spirits and presents without any assistance.   Patient was referred and last seen by Primary Care Provider, Dr. Andrey Campanile, on 03/24/2023. I saw her on 03/25/23 and enrolled her in LIBERATE. Of note, I also started her on Trulicity and decreased her glipizide to 5 mg XL daily.   Since her last visit, she has done well. Denies any NV, abdominal pain. No changes in vision. She has completed ~5 weeks of Trulicity 0.75 mg. Also is adherent to her metformin and glipizide. No concerns today.   Family/Social History:  Fhx: maternal aunt, mother, MGM have DM, HTN, obesity, kidney disease  Tobacco: never smoker Alcohol: none reported  Current diabetes medications include: metformin XR 750 mg daily, glipizide 5 mg XL in the AM, Trulicity 0.75 mg weekly Patient reports adherence to taking all medications as prescribed.   Insurance coverage: Aetna  Patient denies hypoglycemic events.  Patient denies nocturia (nighttime urination).  Patient denies neuropathy (nerve pain). Patient denies visual changes. Patient reports self foot exams.   Patient reported dietary habits:  -Limits starches and sugars during the day but admits that she craves sweets in the evening   Patient-reported exercise habits:  -None reported; limited right now   O:  Date of Download: 04/29/2023 for a 28-day report % Time CGM is active: 99% Average Glucose: 124 mg/dL Glucose Management Indicator: 6.3  Glucose Variability: 24.8 (goal <36%) Time in Goal:  - Time in range 70-180: 94% - Time above range: 5% - Time below range: 1% Observed patterns:  Lab Results  Component Value Date   HGBA1C 8.2 (A) 03/25/2023   There were no vitals filed for this visit.  Lipid Panel     Component Value Date/Time   CHOL 181 12/26/2018  1010   TRIG 152 (H) 12/26/2018 1010   HDL 43 12/26/2018 1010   CHOLHDL 4.2 12/26/2018 1010   CHOLHDL 3.3 10/05/2012 1345   VLDL 16 10/05/2012 1345   LDLCALC 111 (H) 12/26/2018 1010   Clinical Atherosclerotic Cardiovascular Disease (ASCVD): No  The ASCVD Risk score (Arnett DK, et al., 2019) failed to calculate for the following reasons:   Cannot find a previous HDL lab   Cannot find a previous total cholesterol lab   Patient is participating in a Managed Medicaid Plan: No   A/P: Diabetes longstanding currently just above goal. Patient is able to verbalize appropriate hypoglycemia management plan. Medication adherence appears to be optimal. Her CGM report shows excellent control with Trulicity. Of note, she just transitioned down to glipizide 5 mg XL last week. Since then, she has had no hypoglycemia. We will continue this for now and increase Trulicity. I have instructed her to stop glipizide if she notices hypoglycemia occur with the increased Trulicity dose. -Continued metformin and glipizide at current doses for now.  -Increase Trulicity to 1.5 mg weekly.  -Patient educated on purpose, proper use, and potential adverse effects of Trulicity.  -Extensively discussed pathophysiology of diabetes, recommended lifestyle interventions, dietary effects on blood sugar control.  -Counseled on s/sx of and management of hypoglycemia.  -Next A1c anticipated 05/2023.   Written patient instructions provided. Patient verbalized understanding of treatment plan.  Total time in face to face counseling 30 minutes.    Follow-up:  Pharmacist in 1 month  Georgiana Shore  Flonnie Overman, PharmD, [place], CPP Clinical Pharmacist [facility] & [facility] [phone]

## 2023-05-15 ENCOUNTER — Other Ambulatory Visit: Payer: Self-pay | Admitting: Family Medicine

## 2023-05-15 DIAGNOSIS — E119 Type 2 diabetes mellitus without complications: Secondary | ICD-10-CM

## 2023-05-20 ENCOUNTER — Other Ambulatory Visit: Payer: Self-pay

## 2023-05-27 ENCOUNTER — Other Ambulatory Visit: Payer: Self-pay

## 2023-05-27 ENCOUNTER — Other Ambulatory Visit: Payer: Self-pay | Admitting: Family Medicine

## 2023-06-01 ENCOUNTER — Other Ambulatory Visit: Payer: Self-pay

## 2023-06-01 ENCOUNTER — Ambulatory Visit: Attending: Family Medicine | Admitting: Pharmacist

## 2023-06-01 DIAGNOSIS — E119 Type 2 diabetes mellitus without complications: Secondary | ICD-10-CM

## 2023-06-01 DIAGNOSIS — Z7985 Long-term (current) use of injectable non-insulin antidiabetic drugs: Secondary | ICD-10-CM

## 2023-06-01 DIAGNOSIS — Z7984 Long term (current) use of oral hypoglycemic drugs: Secondary | ICD-10-CM

## 2023-06-01 LAB — POCT GLYCOSYLATED HEMOGLOBIN (HGB A1C): HbA1c, POC (controlled diabetic range): 6.1 % (ref 0.0–7.0)

## 2023-06-01 NOTE — Progress Notes (Signed)
 S:     No chief complaint on file.  43 y.o. female who presents for diabetes evaluation, education, and management in the context of the LIBERATE Study.   PMH is significant for T2DM, GERD, obesity, endometriosis, vit d deficiency, menorrhagia with hx of iron deficiency anemia.    Patient was referred and last seen by Primary Care Provider, Dr. Andrey Campanile, on 03/24/2023. I last saw her 04/29/2023 and increased Trulicity.   Today, patient arrives in good spirits and presents without any assistance. She endorses daily hypoglycemia with numbers in the 50s-60s. Treats successfully. Is adherent to glipizide, metformin and Trulicity. Denies any NV, abdominal pain, or changes in vision with the higher (1.5mg ) dose.  Family/Social History:  Fhx: maternal aunt, mother, MGM have DM, HTN, obesity, kidney disease  Tobacco: never smoker Alcohol: none reported  Current diabetes medications include: glipizide 5 mg XL daily, metformin 750 mg XR daily, Trulicity 1.5 mg weekly Patient reports adherence to taking all medications as prescribed.   Insurance coverage: Aetna  Patient denies hypoglycemic events.  CGM Study Consent: Yes Study visit: 3 Month Follow Up Visit  CGM Data Download date: 06/01/2023, 28-day % Time CGM Is Active: 99 % Average glucose (mg/dL): 161 mg/dL Glucose Management Indicator (%): 6.1 % Glucose Variability (%): 23.7 % Time Above Range >180 mg/dL (%): 2 % Time in Range 70-180 mg/dL (%): 97 % Time Below Range <70 mg/dL (%): 1 %  Diabetes Distress Scale Feeling like diabetes is taking up too much of my mental and physical energy every day.: Not a problem Feeling that my doctor doesn't know enough about diabetes and diabetic care. : Not a problem Feeling angry, scared, and/or depressed when I think about living with diabetes : A slight problem Feeling that my doctor doesn't give my clear directions on how to manage my diabetes. : Not a problem Feeling that im not testing my  blood sugars frequently enough.: Not a problem Feeling that I'm often failing with my diabetes routine: Not a problem Feelling that friends and family are not supportive enough of self care efforts.: Not a problem Feeling that diabetes controls my life.: Not a problem Feeling that my doctor doesn't take my concerns seriously enough: Not a problem Not feeling confident in my day to day ability to manage diabetes: Not a problem Feeling that I will end up with serious long term complications no matter what I do.: Not a problem Feeling that I am not sticking closely enough to a good meal plan.: A slight problem Feeling that friends or family don't appreciate how difficult living with diabetes can be. : Not a problem Feeling overwhelmed by the demands of living with diabetes.: A slight problem Feeling that I don't have a doctor who I can see.: Not a problem Not feeling motivated to keep up my diabetes self management.: A slight problem Feeling that friends or family don't give me the emotional support that I would like. : Not a problem DDS17 Score: 21 Emotional Burden Score: 1.4 Physician related distress score: 1 Regimen Related Distress score : 1.4 Interpersonal distress score: 1    Patient denies nocturia (nighttime urination).  Patient denies neuropathy (nerve pain). Patient denies visual changes. Patient reports self foot exams.   Patient reported dietary habits:  -Still not having the decreased appetite/weight loss that she would like to see. Feels full after eating but is still interested in pursuing Trulicity in the future.   Patient-reported exercise habits:  -None reported  O:   ROS  Physical Exam   Lab Results  Component Value Date   HGBA1C 6.1 06/01/2023    There were no vitals filed for this visit.   Lipid Panel     Component Value Date/Time   CHOL 181 12/26/2018 1010   TRIG 152 (H) 12/26/2018 1010   HDL 43 12/26/2018 1010   CHOLHDL 4.2 12/26/2018 1010    CHOLHDL 3.3 10/05/2012 1345   VLDL 16 10/05/2012 1345   LDLCALC 111 (H) 12/26/2018 1010    Clinical Atherosclerotic Cardiovascular Disease (ASCVD): No  The ASCVD Risk score (Arnett DK, et al., 2019) failed to calculate for the following reasons:   Cannot find a previous HDL lab   Cannot find a previous total cholesterol lab   Patient is participating in a Managed Medicaid Plan: No     A/P:  LIBERATE Study:  - 6 sensors provided for a 3 month supply. Educated to contact the office if the sensor falls off early and replacements are needed before their next [employer].   Diabetes longstanding currently controlled. A1c today down to 6.1%, commended her for this! She is not symptomatic from a hyperglycemia standpoint but is having daily hypoglycemia. Patient is able to verbalize appropriate hypoglycemia management plan. Medication adherence appears appropriate. -ContinuedTrulicity and metformin.  -Stop glipizide due to hypoglycemia.  -Patient educated on purpose, proper use, and potential adverse effects of glipizide, Trulicity.  -Extensively discussed pathophysiology of diabetes, recommended lifestyle interventions, dietary effects on blood sugar control.  -Counseled on s/sx of and management of hypoglycemia.  -Next A1c anticipated 08/2023.   Written patient instructions provided. Patient verbalized understanding of treatment plan.  Total time in face to face counseling 30 minutes.    Follow-up:  Pharmacist in 1 month.   Butch Penny, PharmD, [place], CPP Clinical Pharmacist [facility] & [facility] [phone]

## 2023-06-03 ENCOUNTER — Other Ambulatory Visit: Payer: Self-pay

## 2023-06-03 ENCOUNTER — Encounter: Payer: Self-pay | Admitting: Family Medicine

## 2023-06-03 MED ORDER — AMPHETAMINE-DEXTROAMPHET ER 30 MG PO CP24
30.0000 mg | ORAL_CAPSULE | ORAL | 0 refills | Status: DC
  Filled 2023-06-03: qty 30, 30d supply, fill #0

## 2023-06-03 MED ORDER — AMPHETAMINE-DEXTROAMPHETAMINE 5 MG PO TABS
5.0000 mg | ORAL_TABLET | Freq: Every day | ORAL | 0 refills | Status: DC
  Filled 2023-06-03: qty 30, 30d supply, fill #0

## 2023-06-03 NOTE — Telephone Encounter (Signed)
 Patient request

## 2023-06-07 ENCOUNTER — Other Ambulatory Visit: Payer: Self-pay

## 2023-06-17 ENCOUNTER — Other Ambulatory Visit: Payer: Self-pay

## 2023-06-18 ENCOUNTER — Other Ambulatory Visit: Payer: Self-pay

## 2023-06-19 ENCOUNTER — Other Ambulatory Visit: Payer: Self-pay | Admitting: Family Medicine

## 2023-06-21 ENCOUNTER — Other Ambulatory Visit: Payer: Self-pay | Admitting: Family Medicine

## 2023-06-21 NOTE — Telephone Encounter (Signed)
 Requested medication (s) are due for refill today - yes   Requested medication (s) are on the active medication list -yes  Future visit scheduled -yes  Last refill: 03/24/23 #90  Notes to clinic: fails lab protocol-over 1 year-  06/01/21  Requested Prescriptions  Pending Prescriptions Disp Refills   buPROPion  (WELLBUTRIN  XL) 300 MG 24 hr tablet [Pharmacy Med Name: BUPROPION  HCL XL 300 MG TABLET] 90 tablet 0    Sig: TAKE 1 TABLET BY MOUTH EVERY DAY     Psychiatry: Antidepressants - bupropion  Failed - 06/21/2023 10:45 AM      Failed - AST in normal range and within 360 days    AST  Date Value Ref Range Status  06/01/2021 15 15 - 41 U/L Final         Failed - ALT in normal range and within 360 days    ALT  Date Value Ref Range Status  06/01/2021 14 0 - 44 U/L Final         Passed - Cr in normal range and within 360 days    Creat  Date Value Ref Range Status  11/12/2014 0.70 0.50 - 1.10 mg/dL Final   Creatinine, Ser  Date Value Ref Range Status  12/07/2022 0.91 0.44 - 1.00 mg/dL Final         Passed - Last BP in normal range    BP Readings from Last 1 Encounters:  03/24/23 126/87         Passed - Valid encounter within last 6 months    Recent Outpatient Visits           2 weeks ago Type 2 diabetes mellitus without complication, without long-term current use of insulin (HCC)   [facility] Of [place]. [facility] Freada Jacobs, [place] L, RPH-CPP   1 month ago Type 2 diabetes mellitus without complication, without long-term current use of insulin (HCC)   [facility] - [facility] Dept Of [place]. [facility] Freada Jacobs, [place] L, RPH-CPP   2 months ago Type 2 diabetes mellitus without complication, without long-term current use of insulin (HCC)   [facility] - A Dept Of [place]. [facility] Freada Jacobs, [place] L, RPH-CPP   2 months ago Type 2 diabetes mellitus without complication,  without long-term current use of insulin [facility])   [place] [facility], Ray Caffey, MD   6 months ago Type 2 diabetes mellitus without complication, without long-term current use of insulin (HCC)   [place] [facility], MD       Future Appointments             Tomorrow Abraham Abo, MD [place] [facility]               Requested Prescriptions  Pending Prescriptions Disp Refills   buPROPion  (WELLBUTRIN  XL) 300 MG 24 hr tablet [Pharmacy Med Name: BUPROPION  HCL XL 300 MG TABLET] 90 tablet 0    Sig: TAKE 1 TABLET BY MOUTH EVERY DAY     Psychiatry: Antidepressants - bupropion  Failed - 06/21/2023 10:45 AM      Failed - AST in normal range and within 360 days    AST  Date Value Ref Range Status  06/01/2021 15 15 - 41 U/L Final         Failed - ALT in normal range  and within 360 days    ALT  Date Value Ref Range Status  06/01/2021 14 0 - 44 U/L Final         Passed - Cr in normal range and within 360 days    Creat  Date Value Ref Range Status  11/12/2014 0.70 0.50 - 1.10 mg/dL Final   Creatinine, Ser  Date Value Ref Range Status  12/07/2022 0.91 0.44 - 1.00 mg/dL Final         Passed - Last BP in normal range    BP Readings from Last 1 Encounters:  03/24/23 126/87         Passed - Valid encounter within last 6 months    Recent Outpatient Visits           2 weeks ago Type 2 diabetes mellitus without complication, without long-term current use of insulin (HCC)   [facility] Dept Of [place]. [facility] Freada Jacobs, [place] L, RPH-CPP   1 month ago Type 2 diabetes mellitus without complication, without long-term current use of insulin (HCC)   [facility] - [facility] Dept Of [place]. [facility] Freada Jacobs, [place] L, RPH-CPP   2 months ago Type 2 diabetes mellitus without complication, without long-term current use of  insulin (HCC)   [facility] - A Dept Of . [facility] Git[facility] L, RPH-CPP   2 months ago Type 2 diabetes mellitus without complication, without long-term current use of insulin [facility])   [place] [facility], Ray Caffey, MD   6 months ago Type 2 diabetes mellitus without complication, without long-term current use of insulin [facility])    Primary Care at [facility], MD       Future Appointments             Tomorrow Abraham Abo, MD [facility] at [facility]

## 2023-06-22 ENCOUNTER — Encounter: Payer: Self-pay | Admitting: Family Medicine

## 2023-06-22 ENCOUNTER — Ambulatory Visit: Payer: Self-pay | Admitting: Family Medicine

## 2023-06-22 VITALS — BP 116/82 | HR 103 | Wt 193.4 lb

## 2023-06-22 DIAGNOSIS — E66812 Obesity, class 2: Secondary | ICD-10-CM

## 2023-06-22 DIAGNOSIS — F419 Anxiety disorder, unspecified: Secondary | ICD-10-CM

## 2023-06-22 DIAGNOSIS — E119 Type 2 diabetes mellitus without complications: Secondary | ICD-10-CM | POA: Diagnosis not present

## 2023-06-22 DIAGNOSIS — K219 Gastro-esophageal reflux disease without esophagitis: Secondary | ICD-10-CM

## 2023-06-22 DIAGNOSIS — F32A Depression, unspecified: Secondary | ICD-10-CM

## 2023-06-22 DIAGNOSIS — K5903 Drug induced constipation: Secondary | ICD-10-CM | POA: Diagnosis not present

## 2023-06-22 DIAGNOSIS — Z6837 Body mass index (BMI) 37.0-37.9, adult: Secondary | ICD-10-CM

## 2023-06-22 DIAGNOSIS — E6609 Other obesity due to excess calories: Secondary | ICD-10-CM

## 2023-06-22 DIAGNOSIS — Z7985 Long-term (current) use of injectable non-insulin antidiabetic drugs: Secondary | ICD-10-CM | POA: Diagnosis not present

## 2023-06-22 NOTE — Telephone Encounter (Signed)
 Requested Prescriptions  Pending Prescriptions Disp Refills   losartan  (COZAAR ) 25 MG tablet [Pharmacy Med Name: LOSARTAN  POTASSIUM 25 MG TAB] 90 tablet 0    Sig: TAKE 1 TABLET (25 MG TOTAL) BY MOUTH DAILY.     Cardiovascular:  Angiotensin Receptor Blockers Failed - 06/22/2023  2:38 PM      Failed - Cr in normal range and within 180 days    Creat  Date Value Ref Range Status  11/12/2014 0.70 0.50 - 1.10 mg/dL Final   Creatinine, Ser  Date Value Ref Range Status  12/07/2022 0.91 0.44 - 1.00 mg/dL Final         Failed - K in normal range and within 180 days    Potassium  Date Value Ref Range Status  12/07/2022 4.2 3.5 - 5.1 mmol/L Final         Passed - Patient is not pregnant      Passed - Last BP in normal range    BP Readings from Last 1 Encounters:  03/24/23 126/87         Passed - Valid encounter within last 6 months    Recent Outpatient Visits           3 weeks ago Type 2 diabetes mellitus without complication, without long-term current use of insulin (HCC)   [facility] [place] Freada Jacobs, [place] L, RPH-CPP   1 month ago Type 2 diabetes mellitus without complication, without long-term current use of insulin (HCC)   [facility] - [facility] Of [place]. [facility] Freada Jacobs, [place] L, RPH-CPP   2 months ago Type 2 diabetes mellitus without complication, without long-term current use of insulin (HCC)   [facility] - [facility] Dept Of [place]. [facility] Freada Jacobs, [place] L, RPH-CPP   3 months ago Type 2 diabetes mellitus without complication, without long-term current use of insulin [facility])   [place] [facility], Ray Caffey, MD   6 months ago Type 2 diabetes mellitus without complication, without long-term current use of insulin [facility])   [place] [facility], MD       Future Appointments              Today Abraham Abo, MD [facility] at [facility]

## 2023-06-22 NOTE — Progress Notes (Signed)
 Established Patient Office Visit  Subjective    Patient ID: Aimee King, female    DOB: 1980-08-11  Age: 43 y.o. MRN: [id number]  CC:  Chief Complaint  Patient presents with   Medical Management of Chronic Issues   Gastroesophageal Reflux    Last 3 week, doesn't want to be on PPI     HPI Aimee King presents for routine follow up of chronic med issues including GERD and diabetes. Patient reports that she has had increased social stressors.   Outpatient Encounter Medications as of 06/22/2023  Medication Sig   albuterol  (VENTOLIN  HFA) 108 (90 Base) MCG/ACT inhaler Inhale into the lungs every 6 (six) hours as needed for wheezing or shortness of breath.   amphetamine -dextroamphetamine  (ADDERALL XR) 30 MG 24 hr capsule Take 1 capsule (30 mg total) by mouth every morning.   amphetamine -dextroamphetamine  (ADDERALL) 5 MG tablet Take 1 tablet (5 mg total) by mouth daily.   budesonide -formoterol  (SYMBICORT ) 80-4.5 MCG/ACT inhaler Inhale 2 puffs into the lungs 2 (two) times daily.   buPROPion  (WELLBUTRIN  XL) 300 MG 24 hr tablet Take 1 tablet (300 mg total) by mouth daily.   Dulaglutide  (TRULICITY ) 1.5 MG/0.5ML SOAJ Inject 1.5 mg into the skin once a week.   fexofenadine  (ALLEGRA ) 180 MG tablet TAKE 1 TABLET BY MOUTH EVERY DAY   losartan  (COZAAR ) 25 MG tablet TAKE 1 TABLET (25 MG TOTAL) BY MOUTH DAILY.   megestrol  (MEGACE ) 40 MG tablet TAKE 2 TABLETS BY MOUTH TWICE A DAY   metFORMIN  (GLUCOPHAGE -XR) 750 MG 24 hr tablet Take 1 tablet (750 mg total) by mouth daily with breakfast.   [DISCONTINUED] buPROPion  (WELLBUTRIN  XL) 150 MG 24 hr tablet TAKE 1 TABLET BY MOUTH EVERY DAY   [DISCONTINUED] naproxen  (NAPROSYN ) 500 MG tablet Take 1 tablet (500 mg total) by mouth 2 (two) times daily.   No facility-administered encounter medications on file as of 06/22/2023.    Past Medical History:  Diagnosis Date   Acid reflux    ADD (attention deficit disorder)    Allergy    Anemia     Attention deficit disorder (ADD)    Depression    Endometriosis    IBS (irritable bowel syndrome)     Past Surgical History:  Procedure Laterality Date   CESAREAN SECTION  2004, 2008   CESAREAN SECTION     CHOLECYSTECTOMY  02/23/2002   CHOLECYSTECTOMY     LAPAROSCOPY  02/24/2000   endometriosis   TUBAL LIGATION  02/23/2006   TUBAL LIGATION     tubal ligation reversal Right     Family History  Problem Relation Age of Onset   Hypertension Mother    Diabetes Mother    Hypertension Father    Heart disease Father    Diabetes Father    Diabetes Maternal Grandmother    Kidney disease Maternal Grandfather    Kidney disease Maternal Uncle    Colon cancer Neg Hx    Esophageal cancer Neg Hx    Rectal cancer Neg Hx    Stomach cancer Neg Hx     Social History   Socioeconomic History   Marital status: Married    Spouse name: Not on file   Number of children: 3   Years of education: Not on file   Highest education level: Master's degree (e.g., MA, MS, MEng, MEd, MSW, MBA)  Occupational History   Occupation: educator   Tobacco Use   Smoking status: Never   Smokeless tobacco: Never  Vaping Use  Vaping status: Never Used  Substance and Sexual Activity   Alcohol use: Yes    Alcohol/week: 0.0 standard drinks of alcohol    Comment: ocassionally   Drug use: No   Sexual activity: Yes    Partners: Male    Birth control/protection: None  Other Topics Concern   Not on file  Social History Narrative   ** Merged History Encounter **       Social Drivers of Health   Financial Resource Strain: Low Risk  (04/27/2023)   Overall Financial Resource Strain (CARDIA)    Difficulty of Paying Living Expenses: Not very hard  Food Insecurity: Food Insecurity Present (04/27/2023)   Hunger Vital Sign    Worried About Running Out of Food in the Last Year: Sometimes true    Ran Out of Food in the Last Year: Never true  Transportation Needs: No Transportation Needs (04/27/2023)   PRAPARE -  Administrator, Civil Service (Medical): No    Lack of Transportation (Non-Medical): No  Physical Activity: Unknown (04/27/2023)   Exercise Vital Sign    Days of Exercise per Week: 0 days    Minutes of Exercise per Session: Not on file  Stress: No Stress Concern Present (04/27/2023)   [employer] of Occupational Health - Occupational Stress Questionnaire    Feeling of Stress : Only a little  Social Connections: Socially Integrated (04/27/2023)   Social Connection and Isolation Panel [NHANES]    Frequency of Communication with Friends and Family: More than three times a week    Frequency of Social Gatherings with Friends and Family: Once a week    Attends Religious Services: More than 4 times per year    Active Member of [employer] or Organizations: Yes    Attends Engineer, structural: More than 4 times per year    Marital Status: Married  Catering manager Violence: Not At Risk (11/09/2022)   Humiliation, Afraid, Rape, and Kick questionnaire    Fear of Current or Ex-Partner: No    Emotionally Abused: No    Physically Abused: No    Sexually Abused: No    Review of Systems  Gastrointestinal:  Positive for constipation.  All other systems reviewed and are negative.       Objective    BP 116/82 (BP Location: Right Arm, Patient Position: Sitting, Cuff Size: Normal)   Pulse (!) 103   Wt 193 lb 6.4 oz (87.7 kg)   SpO2 98%   BMI 37.77 kg/m   Physical Exam Vitals and nursing note reviewed.  Constitutional:      General: She is not in acute distress.    Appearance: She is obese.  Cardiovascular:     Rate and Rhythm: Normal rate and regular rhythm.  Pulmonary:     Effort: Pulmonary effort is normal.     Breath sounds: Normal breath sounds.  Abdominal:     Palpations: Abdomen is soft.     Tenderness: There is no abdominal tenderness.  Neurological:     General: No focal deficit present.     Mental Status: She is alert and oriented to person, place, and time.   Psychiatric:        Mood and Affect: Affect normal. Mood is depressed.        Behavior: Behavior normal.         Assessment & Plan:   Class 2 obesity due to excess calories without serious comorbidity with body mass index (BMI) of 37.0 to 37.9 in  adult  Drug-induced constipation  Type 2 diabetes mellitus without complication, without long-term current use of insulin (HCC)  Long-term current use of injectable noninsulin antidiabetic medication  Gastroesophageal reflux disease, unspecified whether esophagitis present  Anxiety and depression     Return in about 2 months (around 08/22/2023).   Arlo Lama, MD

## 2023-06-24 ENCOUNTER — Encounter: Payer: Self-pay | Admitting: Family Medicine

## 2023-06-28 ENCOUNTER — Encounter: Payer: Self-pay | Admitting: Family Medicine

## 2023-07-01 ENCOUNTER — Other Ambulatory Visit: Payer: Self-pay

## 2023-07-01 ENCOUNTER — Telehealth: Payer: Self-pay | Admitting: Pharmacist

## 2023-07-01 ENCOUNTER — Ambulatory Visit: Attending: Family Medicine | Admitting: Pharmacist

## 2023-07-01 ENCOUNTER — Encounter: Payer: Self-pay | Admitting: Pharmacist

## 2023-07-01 DIAGNOSIS — Z7985 Long-term (current) use of injectable non-insulin antidiabetic drugs: Secondary | ICD-10-CM | POA: Diagnosis not present

## 2023-07-01 DIAGNOSIS — Z7984 Long term (current) use of oral hypoglycemic drugs: Secondary | ICD-10-CM

## 2023-07-01 DIAGNOSIS — E119 Type 2 diabetes mellitus without complications: Secondary | ICD-10-CM | POA: Diagnosis not present

## 2023-07-01 MED ORDER — TIRZEPATIDE 2.5 MG/0.5ML ~~LOC~~ SOAJ
2.5000 mg | SUBCUTANEOUS | 0 refills | Status: DC
  Filled 2023-07-01 – 2023-07-12 (×2): qty 2, 28d supply, fill #0

## 2023-07-01 NOTE — Progress Notes (Signed)
 S:     No chief complaint on file.  43 y.o. female who presents for diabetes evaluation, education, and management. Of note, we also have her enrolled LIBERATE. She completed her official 89-month follow-up last month.  PMH is significant for T2DM, GERD, obesity, endometriosis, vit d deficiency, menorrhagia with hx of iron  deficiency anemia.    Patient was referred and last seen by Primary Care Provider, Dr. Elvan Hamel, on 06/22/2023. I last saw her 06/01/2023 and stopped glipizide . We continued her metformin  and Trulicity .   Today, patient arrives in good spirits and presents without any assistance. Hypoglycemia has resolved since last visit. Is adherent to metformin  and Trulicity . Denies any NV, abdominal pain, or changes in vision with the higher (1.5mg ) dose. Endorses constipation that has worsened since starting Trulicity .   Family/Social History:  Fhx: maternal aunt, mother, MGM have DM, HTN, obesity, kidney disease  Tobacco: never smoker Alcohol: none reported  Current diabetes medications include: glipizide  5 mg XL daily, metformin  750 mg XR daily, Trulicity  1.5 mg weekly Patient reports adherence to taking all medications as prescribed.   Insurance coverage: Aetna  Patient denies hypoglycemic events.  Patient denies nocturia (nighttime urination).  Patient denies neuropathy (nerve pain). Patient denies visual changes. Patient reports self foot exams.   Patient reported dietary habits:  -Still not having the decreased appetite/weight loss that she would like to see. Feels full after eating but is still interested in pursuing Trulicity  in the future.   Patient-reported exercise habits:  -None reported    O:  Date of Download: 07/01/2023, 28-day report % Time CGM is active: 99% Average Glucose: 124 mg/dL Glucose Management Indicator: 6.3  Glucose Variability: 23.1 (goal <36%) Time in Goal:  - Time in range 70-180: 95% - Time above range: 5% - Time below range: 0%  Lab  Results  Component Value Date   HGBA1C 6.1 06/01/2023    There were no vitals filed for this visit.   Lipid Panel     Component Value Date/Time   CHOL 181 12/26/2018 1010   TRIG 152 (H) 12/26/2018 1010   HDL 43 12/26/2018 1010   CHOLHDL 4.2 12/26/2018 1010   CHOLHDL 3.3 10/05/2012 1345   VLDL 16 10/05/2012 1345   LDLCALC 111 (H) 12/26/2018 1010    Clinical Atherosclerotic Cardiovascular Disease (ASCVD): No  The ASCVD Risk score (Arnett DK, et al., 2019) failed to calculate for the following reasons:   Cannot find a previous HDL lab   Cannot find a previous total cholesterol lab   Patient is participating in a Managed Medicaid Plan: No     A/P: Diabetes longstanding currently controlled. A1c is down to 6.1%, commended her for this! She is not symptomatic from a hyperglycemia standpoint and hypoglycemia has resolved since stopping glipizide . Patient is able to verbalize appropriate hypoglycemia management plan. Medication adherence appears appropriate. Since she is having constipation with Trulicity , I will have her stop this. Advised her to skip her dose tomorrow and start Mounjaro next week. She also desires better weight loss efficacy and changing to Mounjaro should help us  with this.  -Continued metformin .  -Stop Trulicity  d/t GI side effects and the to start Mounjaro for better weight loss.  -Start Mounjaro 2.5 mg weekly x4 weeks.  -Patient educated on purpose, proper use, and potential adverse effects of glipizide , Mounjaro.  -Extensively discussed pathophysiology of diabetes, recommended lifestyle interventions, dietary effects on blood sugar control.  -Counseled on s/sx of and management of hypoglycemia.  -Next A1c  anticipated 08/2023.   Written patient instructions provided. Patient verbalized understanding of treatment plan.  Total time in face to face counseling 30 minutes.    Follow-up:  Pharmacist in 1 month.  Marene Shape, PharmD, Becky Bowels, CPP Clinical  Pharmacist [facility] [phone]

## 2023-07-01 NOTE — Telephone Encounter (Signed)
 Rxn sent for [facility]. Patient is endorsing GI side effects with Trulicity . Even though her A1c is controlled she is also desiring weight loss and Trulicity  has proven ineffective. Can we process a PA for this for her?

## 2023-07-02 ENCOUNTER — Other Ambulatory Visit: Payer: Self-pay

## 2023-07-02 ENCOUNTER — Telehealth: Payer: Self-pay

## 2023-07-02 NOTE — Telephone Encounter (Signed)
 Pharmacy Patient Advocate Encounter  Received notification from [facility] that Prior Authorization for MOUNJARO  has been APPROVED from 07/02/2023 to 07/02/2026

## 2023-07-05 ENCOUNTER — Other Ambulatory Visit: Payer: Self-pay | Admitting: Family Medicine

## 2023-07-06 ENCOUNTER — Encounter: Payer: Self-pay | Admitting: Family Medicine

## 2023-07-06 ENCOUNTER — Other Ambulatory Visit: Payer: Self-pay | Admitting: Family Medicine

## 2023-07-06 MED ORDER — AMPHETAMINE-DEXTROAMPHET ER 30 MG PO CP24: 30.0000 mg | ORAL_CAPSULE | ORAL | 0 refills | Status: DC

## 2023-07-06 MED ORDER — AMPHETAMINE-DEXTROAMPHETAMINE 5 MG PO TABS: 5.0000 mg | ORAL_TABLET | Freq: Every day | ORAL | 0 refills | Status: DC

## 2023-07-12 ENCOUNTER — Other Ambulatory Visit: Payer: Self-pay

## 2023-07-17 ENCOUNTER — Other Ambulatory Visit: Payer: Self-pay | Admitting: Family Medicine

## 2023-07-20 ENCOUNTER — Encounter: Payer: Self-pay | Admitting: Family Medicine

## 2023-07-20 ENCOUNTER — Other Ambulatory Visit: Payer: Self-pay | Admitting: Family Medicine

## 2023-07-20 MED ORDER — BUPROPION HCL ER (XL) 300 MG PO TB24: 300.0000 mg | ORAL_TABLET | Freq: Every day | ORAL | 0 refills | Status: DC

## 2023-07-20 NOTE — Telephone Encounter (Signed)
 Refills on Wellbutrin  needed

## 2023-08-02 ENCOUNTER — Other Ambulatory Visit: Payer: Self-pay | Admitting: Family Medicine

## 2023-08-02 NOTE — Progress Notes (Signed)
 S:     No chief complaint on file.  43 y.o. female who presents for diabetes evaluation, education, and management. Of note, we also have her enrolled LIBERATE. She completed her official 16-month follow-up on 06/01/2023.  PMH is significant for T2DM, GERD, obesity, endometriosis, vit d deficiency, menorrhagia with hx of iron  deficiency anemia.    Patient was referred and last seen by Primary Care Provider, Dr. Elvan Hamel, on 06/22/2023. I last saw her 06/01/2023 and stopped glipizide . We continued her metformin  and Trulicity .   Today, patient arrives in good spirits and presents without any assistance. Hypoglycemia has resolved since last visit. Is adherent to metformin  and Mounjaro . At last visit, Trulicity  was discontinued and Mounjaro  2.5 mg weekly was initiated. Denies any NV, abdominal pain, or changes in vision with Mounjaro . Endorses constipation that has improved *** since starting Mounjaro .   Family/Social History:  Fhx: maternal aunt, mother, MGM have DM, HTN, obesity, kidney disease  Tobacco: never smoker Alcohol: none reported  Current diabetes medications include: metformin  750 mg XR daily, Mounjaro  2.5 mg weekly Previous diabetes medications tried: Trulicity  -- GI side effects (constipation); glipizide  XL  Patient reports adherence to taking all medications as prescribed.   Insurance coverage: Aetna  Patient denies hypoglycemic events. ***  Patient denies nocturia (nighttime urination).  Patient denies neuropathy (nerve pain). Patient denies visual changes. Patient reports self foot exams.   Patient reported dietary habits:  -Still not having the decreased appetite/weight loss that she would like to see. Feels full after eating but is still interested in pursuing Trulicity  in the future.   Patient-reported exercise habits:  -None reported   O: *** Date of Download: 07/01/2023, 28-day report % Time CGM is active: 99% Average Glucose: 124 mg/dL Glucose Management  Indicator: 6.3  Glucose Variability: 23.1 (goal <36%) Time in Goal:  - Time in range 70-180: 95% - Time above range: 5% - Time below range: 0%  Lab Results  Component Value Date   HGBA1C 6.1 06/01/2023    There were no vitals filed for this visit.   Lipid Panel     Component Value Date/Time   CHOL 181 12/26/2018 1010   TRIG 152 (H) 12/26/2018 1010   HDL 43 12/26/2018 1010   CHOLHDL 4.2 12/26/2018 1010   CHOLHDL 3.3 10/05/2012 1345   VLDL 16 10/05/2012 1345   LDLCALC 111 (H) 12/26/2018 1010    Clinical Atherosclerotic Cardiovascular Disease (ASCVD): No  The ASCVD Risk score (Arnett DK, et al., 2019) failed to calculate for the following reasons:   Cannot find a previous HDL lab   Cannot find a previous total cholesterol lab   Patient is participating in a Managed Medicaid Plan: No    A/P: Diabetes longstanding currently controlled. A1c is down to 6.1%, commended her for this! She is not symptomatic from a hyperglycemia standpoint and hypoglycemia has resolved since stopping glipizide . Patient is able to verbalize appropriate hypoglycemia management plan. Medication adherence appears appropriate.   -Continued metformin .  -INCREASE Mounjaro  to 5 mg weekly *** -Patient educated on purpose, proper use, and potential adverse effects of metformin , Mounjaro .  -Extensively discussed pathophysiology of diabetes, recommended lifestyle interventions, dietary effects on blood sugar control.  -Counseled on s/sx of and management of hypoglycemia.  -Next A1c anticipated 08/2023.   Written patient instructions provided. Patient verbalized understanding of treatment plan.  Total time in face to face counseling 30 minutes.    Follow-up:  Pharmacist in *** PCP: 08/23/2023  Juleen Oakland, PharmD PGY1  Pharmacy Resident

## 2023-08-03 ENCOUNTER — Encounter: Payer: Self-pay | Admitting: Pharmacist

## 2023-08-03 ENCOUNTER — Other Ambulatory Visit: Payer: Self-pay

## 2023-08-03 ENCOUNTER — Ambulatory Visit: Attending: Family Medicine | Admitting: Pharmacist

## 2023-08-03 DIAGNOSIS — E119 Type 2 diabetes mellitus without complications: Secondary | ICD-10-CM | POA: Diagnosis not present

## 2023-08-03 DIAGNOSIS — Z7984 Long term (current) use of oral hypoglycemic drugs: Secondary | ICD-10-CM

## 2023-08-03 DIAGNOSIS — Z7985 Long-term (current) use of injectable non-insulin antidiabetic drugs: Secondary | ICD-10-CM | POA: Diagnosis not present

## 2023-08-03 MED ORDER — TIRZEPATIDE 5 MG/0.5ML ~~LOC~~ SOAJ
5.0000 mg | SUBCUTANEOUS | 0 refills | Status: DC
  Filled 2023-08-03 – 2023-08-05 (×2): qty 2, 28d supply, fill #0

## 2023-08-04 ENCOUNTER — Other Ambulatory Visit: Payer: Self-pay

## 2023-08-05 ENCOUNTER — Other Ambulatory Visit: Payer: Self-pay

## 2023-08-06 ENCOUNTER — Other Ambulatory Visit: Payer: Self-pay

## 2023-08-10 ENCOUNTER — Other Ambulatory Visit: Payer: Self-pay | Admitting: Family Medicine

## 2023-08-10 ENCOUNTER — Encounter: Payer: Self-pay | Admitting: Family Medicine

## 2023-08-10 MED ORDER — AMPHETAMINE-DEXTROAMPHET ER 30 MG PO CP24: 30.0000 mg | ORAL_CAPSULE | ORAL | 0 refills | Status: DC

## 2023-08-10 MED ORDER — AMPHETAMINE-DEXTROAMPHETAMINE 5 MG PO TABS: 5.0000 mg | ORAL_TABLET | Freq: Every day | ORAL | 0 refills | Status: DC

## 2023-08-17 ENCOUNTER — Other Ambulatory Visit: Payer: Self-pay

## 2023-08-23 ENCOUNTER — Encounter: Payer: Self-pay | Admitting: Family Medicine

## 2023-08-23 ENCOUNTER — Ambulatory Visit: Admitting: Family Medicine

## 2023-08-23 VITALS — BP 126/93 | HR 106 | Wt 190.2 lb

## 2023-08-23 DIAGNOSIS — E6609 Other obesity due to excess calories: Secondary | ICD-10-CM | POA: Diagnosis not present

## 2023-08-23 DIAGNOSIS — F418 Other specified anxiety disorders: Secondary | ICD-10-CM

## 2023-08-23 DIAGNOSIS — Z6837 Body mass index (BMI) 37.0-37.9, adult: Secondary | ICD-10-CM | POA: Diagnosis not present

## 2023-08-23 DIAGNOSIS — R030 Elevated blood-pressure reading, without diagnosis of hypertension: Secondary | ICD-10-CM

## 2023-08-23 DIAGNOSIS — E66812 Obesity, class 2: Secondary | ICD-10-CM

## 2023-08-23 DIAGNOSIS — F902 Attention-deficit hyperactivity disorder, combined type: Secondary | ICD-10-CM

## 2023-08-23 DIAGNOSIS — E119 Type 2 diabetes mellitus without complications: Secondary | ICD-10-CM

## 2023-08-23 DIAGNOSIS — F419 Anxiety disorder, unspecified: Secondary | ICD-10-CM

## 2023-08-23 NOTE — Progress Notes (Unsigned)
 Established Patient Office Visit  Subjective    Patient ID: Aimee King, female    DOB: 15-Jun-1980  Age: 43 y.o. MRN: [id number]  CC:  Chief Complaint  Patient presents with   Medical Management of Chronic Issues    HPI Aimee King presents for follow up of ADHD. Patient reports that she would like to increase her afternoon dosing if possible as she finds the lower dose dose not seem to be sufficient. Patient also reports greatly increased social stressors.   Outpatient Encounter Medications as of 08/23/2023  Medication Sig   albuterol  (VENTOLIN  HFA) 108 (90 Base) MCG/ACT inhaler Inhale into the lungs every 6 (six) hours as needed for wheezing or shortness of breath.   amphetamine -dextroamphetamine  (ADDERALL XR) 30 MG 24 hr capsule Take 1 capsule (30 mg total) by mouth every morning.   amphetamine -dextroamphetamine  (ADDERALL) 5 MG tablet Take 1 tablet (5 mg total) by mouth daily.   budesonide -formoterol  (SYMBICORT ) 80-4.5 MCG/ACT inhaler Inhale 2 puffs into the lungs 2 (two) times daily.   buPROPion  (WELLBUTRIN  XL) 300 MG 24 hr tablet Take 1 tablet (300 mg total) by mouth daily.   fexofenadine  (ALLEGRA ) 180 MG tablet TAKE 1 TABLET BY MOUTH EVERY DAY   losartan  (COZAAR ) 25 MG tablet TAKE 1 TABLET (25 MG TOTAL) BY MOUTH DAILY.   megestrol  (MEGACE ) 40 MG tablet TAKE 2 TABLETS BY MOUTH TWICE A DAY   metFORMIN  (GLUCOPHAGE -XR) 750 MG 24 hr tablet Take 1 tablet (750 mg total) by mouth daily with breakfast.   tirzepatide  (MOUNJARO ) 5 MG/0.5ML Pen Inject 5 mg into the skin once a week.   No facility-administered encounter medications on file as of 08/23/2023.    Past Medical History:  Diagnosis Date   Acid reflux    ADD (attention deficit disorder)    Allergy    Anemia    Attention deficit disorder (ADD)    Depression    Endometriosis    IBS (irritable bowel syndrome)     Past Surgical History:  Procedure Laterality Date   CESAREAN SECTION  2004, 2008    CESAREAN SECTION     CHOLECYSTECTOMY  02/23/2002   CHOLECYSTECTOMY     LAPAROSCOPY  02/24/2000   endometriosis   TUBAL LIGATION  02/23/2006   TUBAL LIGATION     tubal ligation reversal Right     Family History  Problem Relation Age of Onset   Hypertension Mother    Diabetes Mother    Hypertension Father    Heart disease Father    Diabetes Father    Diabetes Maternal Grandmother    Kidney disease Maternal Grandfather    Kidney disease Maternal Uncle    Colon cancer Neg Hx    Esophageal cancer Neg Hx    Rectal cancer Neg Hx    Stomach cancer Neg Hx     Social History   Socioeconomic History   Marital status: Married    Spouse name: Not on file   Number of children: 3   Years of education: Not on file   Highest education level: Master's degree (e.g., MA, MS, MEng, MEd, MSW, MBA)  Occupational History   Occupation: educator   Tobacco Use   Smoking status: Never   Smokeless tobacco: Never  Vaping Use   Vaping status: Never Used  Substance and Sexual Activity   Alcohol use: Yes    Alcohol/week: 0.0 standard drinks of alcohol    Comment: ocassionally   Drug use: No   Sexual activity: Yes  Partners: Male    Birth control/protection: None  Other Topics Concern   Not on file  Social History Narrative   ** Merged History Encounter **       Social Drivers of Health   Financial Resource Strain: Low Risk  (04/27/2023)   Overall Financial Resource Strain (CARDIA)    Difficulty of Paying Living Expenses: Not very hard  Food Insecurity: Food Insecurity Present (04/27/2023)   Hunger Vital Sign    Worried About Running Out of Food in the Last Year: Sometimes true    Ran Out of Food in the Last Year: Never true  Transportation Needs: No Transportation Needs (04/27/2023)   PRAPARE - Administrator, Civil Service (Medical): No    Lack of Transportation (Non-Medical): No  Physical Activity: Unknown (04/27/2023)   Exercise Vital Sign    Days of Exercise per Week: 0  days    Minutes of Exercise per Session: Not on file  Stress: No Stress Concern Present (04/27/2023)   [employer] of Occupational Health - Occupational Stress Questionnaire    Feeling of Stress : Only a little  Social Connections: Socially Integrated (04/27/2023)   Social Connection and Isolation Panel    Frequency of Communication with Friends and Family: More than three times a week    Frequency of Social Gatherings with Friends and Family: Once a week    Attends Religious Services: More than 4 times per year    Active Member of [employer] or Organizations: Yes    Attends Banker Meetings: More than 4 times per year    Marital Status: Married  Catering manager Violence: Not At Risk (11/09/2022)   Humiliation, Afraid, Rape, and Kick questionnaire    Fear of Current or Ex-Partner: No    Emotionally Abused: No    Physically Abused: No    Sexually Abused: No    Review of Systems  Psychiatric/Behavioral:  Positive for depression. Negative for suicidal ideas. The patient is nervous/anxious.   All other systems reviewed and are negative.       Objective    BP (!) 124/91 (BP Location: Right Arm, Patient Position: Sitting, Cuff Size: Normal)   Pulse (!) 106   Wt 190 lb 3.2 oz (86.3 kg)   SpO2 98%   BMI 37.15 kg/m   Physical Exam Vitals and nursing note reviewed.  Constitutional:      General: She is not in acute distress.    Appearance: She is obese.   Cardiovascular:     Rate and Rhythm: Normal rate and regular rhythm.  Pulmonary:     Effort: Pulmonary effort is normal.     Breath sounds: Normal breath sounds.  Abdominal:     Palpations: Abdomen is soft.     Tenderness: There is no abdominal tenderness.   Neurological:     General: No focal deficit present.     Mental Status: She is alert and oriented to person, place, and time.   Psychiatric:        Mood and Affect: Mood is depressed. Affect is tearful.        Behavior: Behavior normal.          Assessment & Plan:   1. Attention deficit hyperactivity disorder (ADHD), combined type (Primary) Will increase afternoon dose from 5 mg to 10 mg daily  2. Class 2 obesity due to excess calories without serious comorbidity with body mass index (BMI) of 37.0 to 37.9 in adult Continue   3. Anxiety  and depression Increased stressors. Continue present management.   4. Type 2 diabetes mellitus without complication, without long-term current use of insulin (HCC) Most recent A1c at goal. Continue   5. Elevated blood pressure reading in office without diagnosis of hypertension    No follow-ups on file.   Tanda Raguel SQUIBB, MD

## 2023-08-24 ENCOUNTER — Encounter: Payer: Self-pay | Admitting: Family Medicine

## 2023-08-30 ENCOUNTER — Other Ambulatory Visit: Payer: Self-pay

## 2023-08-30 ENCOUNTER — Other Ambulatory Visit: Payer: Self-pay | Admitting: Family Medicine

## 2023-08-30 MED ORDER — MOUNJARO 5 MG/0.5ML ~~LOC~~ SOAJ
5.0000 mg | SUBCUTANEOUS | 0 refills | Status: DC
Start: 2023-08-30 — End: 2023-09-02
  Filled 2023-08-30 – 2023-08-31 (×2): qty 2, 28d supply, fill #0

## 2023-08-31 ENCOUNTER — Other Ambulatory Visit: Payer: Self-pay

## 2023-09-02 ENCOUNTER — Other Ambulatory Visit: Payer: Self-pay

## 2023-09-02 ENCOUNTER — Encounter: Payer: Self-pay | Admitting: Pharmacist

## 2023-09-02 ENCOUNTER — Ambulatory Visit: Attending: Family Medicine | Admitting: Pharmacist

## 2023-09-02 DIAGNOSIS — Z7985 Long-term (current) use of injectable non-insulin antidiabetic drugs: Secondary | ICD-10-CM

## 2023-09-02 DIAGNOSIS — Z7984 Long term (current) use of oral hypoglycemic drugs: Secondary | ICD-10-CM

## 2023-09-02 DIAGNOSIS — E119 Type 2 diabetes mellitus without complications: Secondary | ICD-10-CM

## 2023-09-02 LAB — POCT GLYCOSYLATED HEMOGLOBIN (HGB A1C): HbA1c, POC (controlled diabetic range): 6.1 % (ref 0.0–7.0)

## 2023-09-02 MED ORDER — TIRZEPATIDE 10 MG/0.5ML ~~LOC~~ SOAJ
10.0000 mg | SUBCUTANEOUS | 1 refills | Status: DC
  Filled 2023-09-02: qty 6, 84d supply, fill #0
  Filled 2023-09-02: qty 2, 28d supply, fill #0
  Filled 2023-09-25: qty 2, 28d supply, fill #1
  Filled 2023-10-24: qty 2, 28d supply, fill #2
  Filled 2023-11-23: qty 2, 28d supply, fill #3
  Filled 2023-12-29: qty 2, 28d supply, fill #4
  Filled 2024-01-23: qty 2, 28d supply, fill #5

## 2023-09-02 MED ORDER — FREESTYLE LIBRE 3 PLUS SENSOR MISC
6 refills | Status: DC
  Filled 2023-09-02: qty 2, 30d supply, fill #0

## 2023-09-02 NOTE — Progress Notes (Signed)
 S:     No chief complaint on file.  43 y.o. female who presents for diabetes evaluation, education, and management in the context of the LIBERATE Study. Today, patient arrives in good spirits and presents without any assistance. This is her final study visit.   Patient was referred and last seen by Primary Care Provider, Dr. Tanda, on 08/23/2023.  I last saw her on 08/03/2023 and increased Mounjaro  dose.    PMH is significant for T2DM, GERD, obesity, endometriosis, vit d deficiency, menorrhagia with hx of iron  deficiency anemia.    Today, patient arrives in good spirits and presents without any assistance. Endorses adherence to metformin  and Mounjaro . Denies any N/V, abdominal pain, or changes in vision with Mounjaro  at the increased dose.   Family/Social History:  Fhx: maternal aunt, mother, MGM have DM, HTN, obesity, kidney disease  Tobacco: never smoker Alcohol: none reported  Current diabetes medications include:  metformin  750 mg XR daily, Mounjaro  5 mg weekly (Sundays) Patient reports adherence to taking all medications as prescribed.   Insurance coverage: [employer]  Patient reports hypoglycemic events. Even though not reported in the [facility] report in her AGP, she has several values in the 50s-60s since last visit.   CGM Study Consent: Yes Study visit: 6 Month Follow Up Visit  CGM Data Download date: 09/02/23 for a 90-day report % Time CGM Is Active: 99 % Average glucose (mg/dL): 876 mg/dL Glucose Management Indicator (%): 6.3 % Glucose Variability (%): 22 % Time Above Range >180 mg/dL (%): 5 % Time in Range 70-180 mg/dL (%): 95 % Time Below Range <70 mg/dL (%): 0 %  Diabetes Distress Scale Feeling like diabetes is taking up too much of my mental and physical energy every day.: A slight problem Feeling that my doctor doesn't know enough about diabetes and diabetic care. : Not a problem Feeling angry, scared, and/or depressed when I think about living with diabetes : A  slight problem Feeling that my doctor doesn't give my clear directions on how to manage my diabetes. : Not a problem Feeling that im not testing my blood sugars frequently enough.: Not a problem Feeling that I'm often failing with my diabetes routine: Not a problem Feelling that friends and family are not supportive enough of self care efforts.: Not a problem Feeling that diabetes controls my life.: A slight problem Feeling that my doctor doesn't take my concerns seriously enough: Not a problem Not feeling confident in my day to day ability to manage diabetes: A slight problem Feeling that I will end up with serious long term complications no matter what I do.: A slight problem Feeling that I am not sticking closely enough to a good meal plan.: A slight problem Feeling that friends or family don't appreciate how difficult living with diabetes can be. : Not a problem Feeling overwhelmed by the demands of living with diabetes.: Not a problem Feeling that I don't have a doctor who I can see.: Not a problem Not feeling motivated to keep up my diabetes self management.: Not a problem Feeling that friends or family don't give me the emotional support that I would like. : Not a problem DDS17 Score: 23 Emotional Burden Score: 1.8 Physician related distress score: 1 Regimen Related Distress score : 1.4 Interpersonal distress score: 1   Patient denies nocturia (nighttime urination).  Patient denies neuropathy (nerve pain). Patient denies visual changes. Patient reports self foot exams.   Patient reported dietary habits:  -Is endorsing appetite suppression with  the switch to Mounjaro . Feels better in clothing, seeing some weight loss.    Patient-reported exercise habits:  -None reported    O:  Lab Results  Component Value Date   HGBA1C 6.1 06/01/2023   There were no vitals filed for this visit.  Lipid Panel     Component Value Date/Time   CHOL 181 12/26/2018 1010   TRIG 152 (H)  12/26/2018 1010   HDL 43 12/26/2018 1010   CHOLHDL 4.2 12/26/2018 1010   CHOLHDL 3.3 10/05/2012 1345   VLDL 16 10/05/2012 1345   LDLCALC 111 (H) 12/26/2018 1010    Clinical Atherosclerotic Cardiovascular Disease (ASCVD): No  The ASCVD Risk score (Arnett DK, et al., 2019) failed to calculate for the following reasons:   Cannot find a previous HDL lab   Cannot find a previous total cholesterol lab   Patient is participating in a Managed Medicaid Plan: No     A/P:  LIBERATE Study:  - Discussed options for obtaining continued supply of Libre 3 sensors.   Diabetes longstanding currently well-controlled. Commended her for this. Will continue to titrate Mounjaro  and stop metformin  given her hypoglycemia. Patient is able to verbalize appropriate hypoglycemia management plan. Medication adherence appears to be optimal at this point. -Increase Mounjaro  to 10 mg weekly -Discontinue metformin . -Patient educated on purpose, proper use, and potential adverse effects of Mounjaro .  -Extensively discussed pathophysiology of diabetes, recommended lifestyle interventions, dietary effects on blood sugar control.  -Counseled on s/sx of and management of hypoglycemia.  -Next A1c anticipated 11/2023.   Written patient instructions provided. Patient verbalized understanding of treatment plan.  Total time in face to face counseling 20 minutes.    Follow-up:  Pharmacist prn. PCP clinic visit in 11/23/2023.   Herlene Fleeta Morris, PharmD, [name], CPP Clinical Pharmacist [facility] [phone]

## 2023-09-13 ENCOUNTER — Encounter: Payer: Self-pay | Admitting: Family Medicine

## 2023-09-13 ENCOUNTER — Other Ambulatory Visit: Payer: Self-pay

## 2023-09-13 ENCOUNTER — Other Ambulatory Visit: Payer: Self-pay | Admitting: Family Medicine

## 2023-09-13 MED ORDER — AMPHETAMINE-DEXTROAMPHET ER 30 MG PO CP24: 30.0000 mg | ORAL_CAPSULE | ORAL | 0 refills | Status: DC

## 2023-09-13 MED ORDER — AMPHETAMINE-DEXTROAMPHETAMINE 10 MG PO TABS: 10.0000 mg | ORAL_TABLET | Freq: Every day | ORAL | 0 refills | Status: DC

## 2023-09-27 ENCOUNTER — Other Ambulatory Visit: Payer: Self-pay

## 2023-09-29 ENCOUNTER — Other Ambulatory Visit: Payer: Self-pay

## 2023-10-06 ENCOUNTER — Other Ambulatory Visit: Payer: Self-pay

## 2023-10-17 ENCOUNTER — Encounter: Payer: Self-pay | Admitting: Family Medicine

## 2023-10-18 ENCOUNTER — Other Ambulatory Visit: Payer: Self-pay | Admitting: Family Medicine

## 2023-10-18 ENCOUNTER — Other Ambulatory Visit: Payer: Self-pay

## 2023-10-18 MED ORDER — AMPHETAMINE-DEXTROAMPHETAMINE 10 MG PO TABS: 10.0000 mg | ORAL_TABLET | Freq: Every day | ORAL | 0 refills | Status: DC

## 2023-10-18 MED ORDER — AMPHETAMINE-DEXTROAMPHET ER 30 MG PO CP24: 30.0000 mg | ORAL_CAPSULE | ORAL | 0 refills | Status: DC

## 2023-10-18 NOTE — Telephone Encounter (Signed)
 Msg sent in TE to provider

## 2023-10-18 NOTE — Telephone Encounter (Signed)
 Refill request

## 2023-10-26 ENCOUNTER — Other Ambulatory Visit: Payer: Self-pay

## 2023-10-31 ENCOUNTER — Other Ambulatory Visit: Payer: Self-pay | Admitting: Family Medicine

## 2023-11-18 ENCOUNTER — Encounter: Payer: Self-pay | Admitting: Family Medicine

## 2023-11-20 ENCOUNTER — Other Ambulatory Visit: Payer: Self-pay | Admitting: Family Medicine

## 2023-11-22 ENCOUNTER — Other Ambulatory Visit: Payer: Self-pay | Admitting: Family Medicine

## 2023-11-22 MED ORDER — AMPHETAMINE-DEXTROAMPHET ER 30 MG PO CP24: 30.0000 mg | ORAL_CAPSULE | ORAL | 0 refills | Status: DC

## 2023-11-22 MED ORDER — AMPHETAMINE-DEXTROAMPHETAMINE 10 MG PO TABS: 10.0000 mg | ORAL_TABLET | Freq: Every day | ORAL | 0 refills | Status: DC

## 2023-11-22 NOTE — Telephone Encounter (Signed)
Pt has an appt on 9/30

## 2023-11-23 ENCOUNTER — Other Ambulatory Visit: Payer: Self-pay | Admitting: Family Medicine

## 2023-11-23 ENCOUNTER — Ambulatory Visit: Admitting: Family Medicine

## 2023-11-23 ENCOUNTER — Telehealth: Payer: Self-pay

## 2023-11-23 VITALS — BP 121/88 | HR 113 | Ht 60.0 in | Wt 176.8 lb

## 2023-11-23 DIAGNOSIS — E119 Type 2 diabetes mellitus without complications: Secondary | ICD-10-CM | POA: Diagnosis not present

## 2023-11-23 DIAGNOSIS — Z79899 Other long term (current) drug therapy: Secondary | ICD-10-CM

## 2023-11-23 DIAGNOSIS — Z7984 Long term (current) use of oral hypoglycemic drugs: Secondary | ICD-10-CM

## 2023-11-23 DIAGNOSIS — F902 Attention-deficit hyperactivity disorder, combined type: Secondary | ICD-10-CM

## 2023-11-23 DIAGNOSIS — E66812 Obesity, class 2: Secondary | ICD-10-CM

## 2023-11-23 DIAGNOSIS — F419 Anxiety disorder, unspecified: Secondary | ICD-10-CM | POA: Diagnosis not present

## 2023-11-23 DIAGNOSIS — F32A Depression, unspecified: Secondary | ICD-10-CM

## 2023-11-23 DIAGNOSIS — Z6837 Body mass index (BMI) 37.0-37.9, adult: Secondary | ICD-10-CM

## 2023-11-23 MED ORDER — BUPROPION HCL ER (XL) 150 MG PO TB24: 450.0000 mg | ORAL_TABLET | Freq: Every day | ORAL | 5 refills | Status: AC

## 2023-11-23 NOTE — Progress Notes (Unsigned)
 Established Patient Office Visit  Subjective    Patient ID: Aimee King, female    DOB: June 22, 1980  Age: 43 y.o. MRN: [id number]  CC:  Chief Complaint  Patient presents with   Medical Management of Chronic Issues    HPI Aimee King presents for routine follow up anxiety/depression.   Outpatient Encounter Medications as of 11/23/2023  Medication Sig   albuterol  (VENTOLIN  HFA) 108 (90 Base) MCG/ACT inhaler Inhale into the lungs every 6 (six) hours as needed for wheezing or shortness of breath.   amphetamine -dextroamphetamine  (ADDERALL XR) 30 MG 24 hr capsule Take 1 capsule (30 mg total) by mouth every morning.   budesonide -formoterol  (SYMBICORT ) 80-4.5 MCG/ACT inhaler Inhale 2 puffs into the lungs 2 (two) times daily.   buPROPion  (WELLBUTRIN  XL) 150 MG 24 hr tablet Take 3 tablets (450 mg total) by mouth daily.   buPROPion  (WELLBUTRIN  XL) 300 MG 24 hr tablet TAKE 1 TABLET BY MOUTH EVERY DAY   losartan  (COZAAR ) 25 MG tablet TAKE 1 TABLET (25 MG TOTAL) BY MOUTH DAILY.   megestrol  (MEGACE ) 40 MG tablet TAKE 2 TABLETS BY MOUTH TWICE A DAY   tirzepatide  (MOUNJARO ) 10 MG/0.5ML Pen Inject 10 mg into the skin once a week.   amphetamine -dextroamphetamine  (ADDERALL) 10 MG tablet Take 1 tablet (10 mg total) by mouth daily. (Patient not taking: Reported on 11/23/2023)   Continuous Glucose Sensor (FREESTYLE LIBRE 3 PLUS SENSOR) MISC Change sensor every 15 days. Use to check blood sugar continuously.   fexofenadine  (ALLEGRA ) 180 MG tablet TAKE 1 TABLET BY MOUTH EVERY DAY   No facility-administered encounter medications on file as of 11/23/2023.    Past Medical History:  Diagnosis Date   Acid reflux    ADD (attention deficit disorder)    Allergy    Anemia    Attention deficit disorder (ADD)    Depression    Endometriosis    IBS (irritable bowel syndrome)     Past Surgical History:  Procedure Laterality Date   CESAREAN SECTION  2004, 2008   CESAREAN SECTION      CHOLECYSTECTOMY  02/23/2002   CHOLECYSTECTOMY     LAPAROSCOPY  02/24/2000   endometriosis   TUBAL LIGATION  02/23/2006   TUBAL LIGATION     tubal ligation reversal Right     Family History  Problem Relation Age of Onset   Hypertension Mother    Diabetes Mother    Hypertension Father    Heart disease Father    Diabetes Father    Diabetes Maternal Grandmother    Kidney disease Maternal Grandfather    Kidney disease Maternal Uncle    Colon cancer Neg Hx    Esophageal cancer Neg Hx    Rectal cancer Neg Hx    Stomach cancer Neg Hx     Social History   Socioeconomic History   Marital status: Married    Spouse name: Not on file   Number of children: 3   Years of education: Not on file   Highest education level: Master's degree (e.g., MA, MS, MEng, MEd, MSW, MBA)  Occupational History   Occupation: educator   Tobacco Use   Smoking status: Never   Smokeless tobacco: Never  Vaping Use   Vaping status: Never Used  Substance and Sexual Activity   Alcohol use: Yes    Alcohol/week: 0.0 standard drinks of alcohol    Comment: ocassionally   Drug use: No   Sexual activity: Yes    Partners: Male  Birth control/protection: None  Other Topics Concern   Not on file  Social History Narrative   ** Merged History Encounter **       Social Drivers of Health   Financial Resource Strain: Medium Risk (11/23/2023)   Overall Financial Resource Strain (CARDIA)    Difficulty of Paying Living Expenses: Somewhat hard  Food Insecurity: No Food Insecurity (11/23/2023)   Hunger Vital Sign    Worried About Running Out of Food in the Last Year: Never true    Ran Out of Food in the Last Year: Never true  Transportation Needs: No Transportation Needs (11/23/2023)   PRAPARE - Administrator, Civil Service (Medical): No    Lack of Transportation (Non-Medical): No  Physical Activity: Inactive (11/23/2023)   Exercise Vital Sign    Days of Exercise per Week: 0 days    Minutes of  Exercise per Session: Not on file  Stress: Stress Concern Present (11/23/2023)   [employer] of Occupational Health - Occupational Stress Questionnaire    Feeling of Stress: Rather much  Social Connections: Moderately Integrated (11/23/2023)   Social Connection and Isolation Panel    Frequency of Communication with Friends and Family: More than three times a week    Frequency of Social Gatherings with Friends and Family: Once a week    Attends Religious Services: More than 4 times per year    Active Member of [employer] or Organizations: Yes    Attends Banker Meetings: More than 4 times per year    Marital Status: Separated  Intimate Partner Violence: Not At Risk (11/09/2022)   Humiliation, Afraid, Rape, and Kick questionnaire    Fear of Current or Ex-Partner: No    Emotionally Abused: No    Physically Abused: No    Sexually Abused: No    ROS      Objective    BP 121/88   Pulse (!) 113   Ht 5' (1.524 m)   Wt 176 lb 12.8 oz (80.2 kg)   LMP  (LMP Unknown)   SpO2 97%   BMI 34.53 kg/m   Physical Exam  {Labs (Optional):23779}    Assessment & Plan:   Type 2 diabetes mellitus without complication, without long-term current use of insulin (HCC)  Attention deficit hyperactivity disorder (ADHD), combined type  Anxiety and depression  Class 2 obesity due to excess calories without serious comorbidity with body mass index (BMI) of 37.0 to 37.9 in adult  Other orders -     buPROPion  HCl ER (XL); Take 3 tablets (450 mg total) by mouth daily.  Dispense: 90 tablet; Refill: 5     Return in about 4 weeks (around 12/21/2023) for follow up.   Aimee King SQUIBB, MD

## 2023-11-23 NOTE — Telephone Encounter (Signed)
 Can you please check and see if this patient's 10 mg adderall instant release needs a PA? Patient is here in office for an appt today and expressed she is having a hard time getting them from the pharmacy and is being told it is due to insurance.

## 2023-11-24 ENCOUNTER — Other Ambulatory Visit: Payer: Self-pay

## 2023-11-24 ENCOUNTER — Encounter: Payer: Self-pay | Admitting: Family Medicine

## 2023-11-24 ENCOUNTER — Telehealth: Payer: Self-pay

## 2023-11-24 ENCOUNTER — Emergency Department (HOSPITAL_COMMUNITY)
Admission: EM | Admit: 2023-11-24 | Discharge: 2023-11-24 | Disposition: A | Discharge status: Home / Self Care | Attending: Emergency Medicine | Admitting: Emergency Medicine

## 2023-11-24 ENCOUNTER — Encounter (HOSPITAL_COMMUNITY): Payer: Self-pay | Admitting: *Deleted

## 2023-11-24 ENCOUNTER — Emergency Department (HOSPITAL_COMMUNITY)

## 2023-11-24 DIAGNOSIS — Z91040 Latex allergy status: Secondary | ICD-10-CM | POA: Insufficient documentation

## 2023-11-24 DIAGNOSIS — R1031 Right lower quadrant pain: Secondary | ICD-10-CM | POA: Diagnosis present

## 2023-11-24 DIAGNOSIS — N390 Urinary tract infection, site not specified: Secondary | ICD-10-CM | POA: Diagnosis not present

## 2023-11-24 DIAGNOSIS — D72829 Elevated white blood cell count, unspecified: Secondary | ICD-10-CM | POA: Diagnosis not present

## 2023-11-24 LAB — COMPREHENSIVE METABOLIC PANEL WITH GFR
ALT: 24 U/L (ref 0–44)
AST: 19 U/L (ref 15–41)
Albumin: 4.5 g/dL (ref 3.5–5.0)
Alkaline Phosphatase: 85 U/L (ref 38–126)
Anion gap: 12 (ref 5–15)
BUN: 11 mg/dL (ref 6–20)
CO2: 24 mmol/L (ref 22–32)
Calcium: 9.8 mg/dL (ref 8.9–10.3)
Chloride: 104 mmol/L (ref 98–111)
Creatinine, Ser: 1.01 mg/dL — ABNORMAL HIGH (ref 0.44–1.00)
GFR, Estimated: >60 mL/min (ref >60)
Glucose, Bld: 98 mg/dL (ref 70–99)
Potassium: 4.9 mmol/L (ref 3.5–5.1)
Sodium: 139 mmol/L (ref 135–145)
Total Bilirubin: 0.7 mg/dL (ref 0.0–1.2)
Total Protein: 8.1 g/dL (ref 6.5–8.1)

## 2023-11-24 LAB — URINALYSIS, ROUTINE W REFLEX MICROSCOPIC
Bacteria, UA: NONE SEEN
Bilirubin Urine: NEGATIVE
Glucose, UA: NEGATIVE mg/dL
Ketones, ur: NEGATIVE mg/dL
Nitrite: NEGATIVE
Protein, ur: 100 mg/dL — AB
RBC / HPF: >50 RBC/hpf (ref 0–5)
Specific Gravity, Urine: 1.021 (ref 1.005–1.030)
WBC, UA: >50 WBC/hpf (ref 0–5)
pH: 5 (ref 5.0–8.0)

## 2023-11-24 LAB — CBC
HCT: 44.7 % (ref 36.0–46.0)
Hemoglobin: 14.1 g/dL (ref 12.0–15.0)
MCH: 27.3 pg (ref 26.0–34.0)
MCHC: 31.5 g/dL (ref 30.0–36.0)
MCV: 86.6 fL (ref 80.0–100.0)
Platelets: 267 K/uL (ref 150–400)
RBC: 5.16 MIL/uL — ABNORMAL HIGH (ref 3.87–5.11)
RDW: 15.5 % (ref 11.5–15.5)
WBC: 13.8 K/uL — ABNORMAL HIGH (ref 4.0–10.5)
nRBC: 0 % (ref 0.0–0.2)

## 2023-11-24 LAB — HCG, SERUM, QUALITATIVE: Preg, Serum: NEGATIVE

## 2023-11-24 LAB — LIPASE, BLOOD: Lipase: 19 U/L (ref 11–51)

## 2023-11-24 MED ORDER — IOHEXOL 300 MG/ML  SOLN
100.0000 mL | Freq: Once | INTRAMUSCULAR | Status: AC | PRN
  Administered 2023-11-24: 100 mL via INTRAVENOUS

## 2023-11-24 MED ORDER — NITROFURANTOIN MONOHYD MACRO 100 MG PO CAPS: 100.0000 mg | ORAL_CAPSULE | Freq: Two times a day (BID) | ORAL | 0 refills | Status: AC

## 2023-11-24 MED ORDER — BUDESONIDE-FORMOTEROL FUMARATE 80-4.5 MCG/ACT IN AERO
2.0000 | INHALATION_SPRAY | Freq: Two times a day (BID) | RESPIRATORY_TRACT | 3 refills | Status: AC
  Filled 2023-11-24: qty 10.2, 30d supply, fill #0

## 2023-11-24 MED ORDER — KETOROLAC TROMETHAMINE 30 MG/ML IJ SOLN
15.0000 mg | Freq: Once | INTRAMUSCULAR | Status: AC
  Administered 2023-11-24: 15 mg via INTRAVENOUS
  Filled 2023-11-24: qty 1

## 2023-11-24 MED ORDER — NITROFURANTOIN MONOHYD MACRO 100 MG PO CAPS
100.0000 mg | ORAL_CAPSULE | Freq: Once | ORAL | Status: AC
  Administered 2023-11-24: 100 mg via ORAL
  Filled 2023-11-24: qty 1

## 2023-11-24 NOTE — ED Provider Notes (Signed)
 [facility] AT [facility] Provider Note   CSN: [id number] Arrival date & time: 11/24/23  8070     Patient presents with: Abdominal Pain   Aimee King is a 43 y.o. female.  {Add pertinent medical, surgical, social history, OB history to HPI:32947}  Abdominal Pain      Prior to Admission medications   Medication Sig Start Date End Date Taking? Authorizing Provider  nitrofurantoin , macrocrystal-monohydrate, (MACROBID ) 100 MG capsule Take 1 capsule (100 mg total) by mouth 2 (two) times daily for 5 days. 11/24/23 11/29/23 Yes Aimee King LABOR, DO  albuterol  (VENTOLIN  HFA) 108 (90 Base) MCG/ACT inhaler Inhale into the lungs every 6 (six) hours as needed for wheezing or shortness of breath.    [provider]  amphetamine -dextroamphetamine  (ADDERALL XR) 30 MG 24 hr capsule Take 1 capsule (30 mg total) by mouth every morning. 11/22/23   Aimee Bleacher, MD  amphetamine -dextroamphetamine  (ADDERALL) 10 MG tablet Take 1 tablet (10 mg total) by mouth daily. Patient not taking: Reported on 11/23/2023 11/22/23   Aimee Bleacher, MD  budesonide -formoterol  (SYMBICORT ) 80-4.5 MCG/ACT inhaler Inhale 2 puffs into the lungs 2 (two) times daily. 11/24/23   [name], [name], MD  buPROPion  (WELLBUTRIN  XL) 150 MG 24 hr tablet Take 3 tablets (450 mg total) by mouth daily. 11/23/23   Aimee Bleacher, MD  buPROPion  (WELLBUTRIN  XL) 300 MG 24 hr tablet TAKE 1 TABLET BY MOUTH EVERY DAY 11/03/23   Aimee Bleacher, MD  fexofenadine  (ALLEGRA ) 180 MG tablet TAKE 1 TABLET BY MOUTH EVERY DAY 08/05/22   Aimee Bleacher, MD  losartan  (COZAAR ) 25 MG tablet TAKE 1 TABLET (25 MG TOTAL) BY MOUTH DAILY. 06/22/23   Aimee Bleacher, MD  megestrol  (MEGACE ) 40 MG tablet TAKE 2 TABLETS BY MOUTH TWICE A DAY 08/13/23   Aimee Glenys RAMAN, MD  metFORMIN  (GLUCOPHAGE -XR) 750 MG 24 hr tablet TAKE 1 TABLET BY MOUTH EVERY DAY WITH BREAKFAST 11/24/23   Aimee Bleacher, MD  tirzepatide  (MOUNJARO ) 10 MG/0.5ML  Pen Inject 10 mg into the skin once a week. 09/02/23   [name], [name], MD    Allergies: Latex, Codeine, Codeine, and Latex    Review of Systems  Gastrointestinal:  Positive for abdominal pain.    Updated Vital Signs BP (!) 119/95 (BP Location: Right Arm)   Pulse (!) 113   Temp 98.9 F (37.2 C) (Oral)   Resp 18   Ht 5' (1.524 m)   Wt 80.3 kg   LMP  (LMP Unknown)   SpO2 99%   BMI 34.57 kg/m   Physical Exam  (all labs ordered are listed, but only abnormal results are displayed) Labs Reviewed  COMPREHENSIVE METABOLIC PANEL WITH GFR - Abnormal; Notable for the following components:      Result Value   Creatinine, Ser 1.01 (*)    All other components within normal limits  CBC - Abnormal; Notable for the following components:   WBC 13.8 (*)    RBC 5.16 (*)    All other components within normal limits  URINALYSIS, ROUTINE W REFLEX MICROSCOPIC - Abnormal; Notable for the following components:   APPearance CLOUDY (*)    Hgb urine dipstick LARGE (*)    Protein, ur 100 (*)    Leukocytes,Ua LARGE (*)    All other components within normal limits  URINE CULTURE  LIPASE, BLOOD  HCG, SERUM, QUALITATIVE    EKG: None  Radiology: CT ABDOMEN PELVIS W CONTRAST Result Date: 11/24/2023 CLINICAL DATA:  Right lower quadrant pain  EXAM: CT ABDOMEN AND PELVIS WITH CONTRAST TECHNIQUE: Multidetector CT imaging of the abdomen and pelvis was performed using the standard protocol following bolus administration of intravenous contrast. RADIATION DOSE REDUCTION: This exam was performed according to the departmental dose-optimization program which includes automated exposure control, adjustment of the mA and/or kV according to patient size and/or use of iterative reconstruction technique. CONTRAST:  OMNIPAQUE  IOHEXOL  300 MG/ML  SOLN COMPARISON:  Pelvic ultrasound 07/18/2021 FINDINGS: Lower chest: No acute abnormality. Hepatobiliary: No focal liver abnormality is seen. Status post cholecystectomy.  No biliary dilatation. Pancreas: Unremarkable. No pancreatic ductal dilatation or surrounding inflammatory changes. Spleen: Normal in size without focal abnormality. Adrenals/Urinary Tract: Adrenal glands are unremarkable. Kidneys are normal, without renal calculi, focal lesion, or hydronephrosis. Bladder is unremarkable. Stomach/Bowel: Stomach is within normal limits. Appendix appears normal. No evidence of bowel wall thickening, distention, or inflammatory changes. Vascular/Lymphatic: No significant vascular findings are present. No enlarged abdominal or pelvic lymph nodes. Reproductive: No suspicious adnexal mass. Heterogenous lobular enlargement of the uterine fundus without circumscribed mass. Other: Negative for pelvic effusion or free air Musculoskeletal: No acute osseous abnormality IMPRESSION: 1. No CT evidence for acute intra-abdominal or pelvic abnormality. Negative for acute appendicitis. 2. Heterogenous globular enlargement of the uterine fundus without circumscribed mass which may be correlated with nonemergent pelvic ultrasound if deemed appropriate (? adenomyosis)_ Electronically Signed   By: Luke Bun M.D.   On: 11/24/2023 21:55    {Document cardiac monitor, telemetry assessment procedure when appropriate:32947} Procedures   Medications Ordered in the ED  nitrofurantoin  (macrocrystal-monohydrate) (MACROBID ) capsule 100 mg (has no administration in time range)  iohexol  (OMNIPAQUE ) 300 MG/ML solution 100 mL (100 mLs Intravenous Contrast Given 11/24/23 2134)      {Click here for ABCD2, HEART and other calculators REFRESH Note before signing:1}                              Medical Decision Making Amount and/or Complexity of Data Reviewed Labs: ordered.  Risk Prescription drug management.   ***  {Document critical care time when appropriate  Document review of labs and clinical decision tools ie CHADS2VASC2, etc  Document your independent review of radiology images and any  outside records  Document your discussion with family members, caretakers and with consultants  Document social determinants of health affecting pt's care  Document your decision making why or why not admission, treatments were needed:32947:::1}   Final diagnoses:  Lower urinary tract infectious disease    ED Discharge Orders          Ordered    nitrofurantoin , macrocrystal-monohydrate, (MACROBID ) 100 MG capsule  2 times daily        11/24/23 2255

## 2023-11-24 NOTE — Telephone Encounter (Signed)
 Pharmacy Patient Advocate Encounter  Received notification from [facility] that Prior Authorization for AMPHETAMINE  SULFATE 10MG  has been APPROVED from 11/24/2023 to 11/24/2026   PA #/Case ID/Reference #: [id number]

## 2023-11-24 NOTE — Telephone Encounter (Signed)
 Pharmacy Patient Advocate Encounter   Received notification from Physician's Office that prior authorization for AMPHETAMINE  10MG  is required/requested.   Insurance verification completed.   The patient is insured through [facility].   Per test claim: PA required; PA submitted to above mentioned insurance via CoverMyMeds Key/confirmation #/EOC [id number] Status is pending

## 2023-11-24 NOTE — Discharge Instructions (Addendum)
 You were seen in the Emergency Department for lower abdominal pain Your blood work showed an elevated white blood count and your urine test showed a UTI CAT scan did not show any other evidence of infection It did show an enlarged uterus which she will need an ultrasound for in the future We have prescribed an antibiotic for you to pick up from your pharmacy begin taking twice daily for the next 5 days to treat the UTI Follow-up with your OB/GYN and PCP team Return to the emergency room for new or worsening symptoms.

## 2023-11-24 NOTE — ED Triage Notes (Signed)
 Pt reports right lower abdominal pain since about 1pm today, says it feels like its coming from my ovaries. Denies n/v/d. Vaginal spotting, but reports that is her normal. Urinating makes the pain worse.

## 2023-11-24 NOTE — ED Notes (Signed)
 Patient transported to CT

## 2023-11-24 NOTE — ED Provider Triage Note (Signed)
 Emergency Medicine Provider Triage Evaluation Note  Aimee King , a 43 y.o. female  was evaluated in triage.  Pt complains of abd pain started at 1300 suddenly. Radiation to suprapubic region. No NVD. Constant with sharp flares. Last BM 2 days ago. On monjero 10mg  - been on same dose for past 2 mo. No SE  LMP unknown on megestrol    Review of Systems  Positive: See hpi Negative: fevers  Physical Exam  BP (!) 119/95 (BP Location: Right Arm)   Pulse (!) 113   Temp 98.9 F (37.2 C) (Oral)   Resp 18   Ht 5' (1.524 m)   Wt 80.3 kg   LMP  (LMP Unknown)   SpO2 99%   BMI 34.57 kg/m  Gen:   Awake, no distress   Resp:  Normal effort  MSK:   Moves extremities without difficulty  Other:  RLQ abd tenderness  Medical Decision Making  Medically screening exam initiated at 7:49 PM.  Appropriate orders placed.  Aimee King was informed that the remainder of the evaluation will be completed by another provider, this initial triage assessment does not replace that evaluation, and the importance of remaining in the ED until their evaluation is complete.  Labs and CT ordered   Aimee King, Aimee King 11/24/23 [id number]

## 2023-11-26 LAB — URINE CULTURE

## 2023-12-09 LAB — OPHTHALMOLOGY REPORT-SCANNED

## 2023-12-17 ENCOUNTER — Other Ambulatory Visit: Payer: Self-pay | Admitting: Family Medicine

## 2023-12-20 ENCOUNTER — Encounter: Payer: Self-pay | Admitting: Family Medicine

## 2023-12-20 NOTE — Telephone Encounter (Signed)
 Too soon for refill.  Requested Prescriptions  Pending Prescriptions Disp Refills   buPROPion  (WELLBUTRIN  XL) 150 MG 24 hr tablet [Pharmacy Med Name: BUPROPION  HCL XL 150 MG TABLET] 270 tablet 2    Sig: TAKE 3 TABLETS BY MOUTH DAILY.     Psychiatry: Antidepressants - bupropion  Failed - 12/20/2023 12:07 PM      Failed - Cr in normal range and within 360 days    Creat  Date Value Ref Range Status  11/12/2014 0.70 0.50 - 1.10 mg/dL Final   Creatinine, Ser  Date Value Ref Range Status  11/24/2023 1.01 (H) 0.44 - 1.00 mg/dL Final         Failed - Last BP in normal range    BP Readings from Last 1 Encounters:  11/24/23 (!) 119/95         Passed - AST in normal range and within 360 days    AST  Date Value Ref Range Status  11/24/2023 19 15 - 41 U/L Final         Passed - ALT in normal range and within 360 days    ALT  Date Value Ref Range Status  11/24/2023 24 0 - 44 U/L Final         Passed - Valid encounter within last 6 months    Recent Outpatient Visits           3 weeks ago Type 2 diabetes mellitus without complication, without long-term current use of insulin (HCC)   [place] [facility], MD   3 months ago Type 2 diabetes mellitus without complication, without long-term current use of insulin (HCC)   [facility] Of [place]. [facility] Fleeta Tonia Garnette LITTIE, RPH-CPP   3 months ago Attention deficit hyperactivity disorder (ADHD), combined type   [place] [facility], [place], MD   4 months ago Type 2 diabetes mellitus without complication, without long-term current use of insulin (HCC)   [place] [facility] Of [place]. [facility] Fleeta Tonia, [place] L, RPH-CPP   5 months ago Type 2 diabetes mellitus without complication, without long-term current use of insulin (HCC)   [facility] [facility] - [facility] Dept Of [place]. [facility] Fleeta Tonia Garnette LITTIE, RPH-CPP

## 2023-12-21 ENCOUNTER — Other Ambulatory Visit: Payer: Self-pay | Admitting: Family Medicine

## 2023-12-21 MED ORDER — AMPHETAMINE-DEXTROAMPHET ER 30 MG PO CP24: 30.0000 mg | ORAL_CAPSULE | ORAL | 0 refills | Status: DC

## 2023-12-21 MED ORDER — AMPHETAMINE-DEXTROAMPHETAMINE 10 MG PO TABS: ORAL_TABLET | ORAL | 0 refills | Status: DC

## 2023-12-31 ENCOUNTER — Other Ambulatory Visit: Payer: Self-pay

## 2024-01-07 ENCOUNTER — Other Ambulatory Visit: Payer: Self-pay

## 2024-01-13 ENCOUNTER — Other Ambulatory Visit (HOSPITAL_COMMUNITY)
Admission: RE | Admit: 2024-01-13 | Discharge: 2024-01-13 | Disposition: A | Source: Ambulatory Visit | Attending: Family Medicine | Admitting: Family Medicine

## 2024-01-13 ENCOUNTER — Encounter: Payer: Self-pay | Admitting: Family Medicine

## 2024-01-13 ENCOUNTER — Ambulatory Visit: Admitting: Family Medicine

## 2024-01-13 VITALS — BP 110/74 | HR 109 | Ht 60.0 in | Wt 172.6 lb

## 2024-01-13 DIAGNOSIS — Z113 Encounter for screening for infections with a predominantly sexual mode of transmission: Secondary | ICD-10-CM

## 2024-01-13 DIAGNOSIS — K649 Unspecified hemorrhoids: Secondary | ICD-10-CM | POA: Diagnosis not present

## 2024-01-13 MED ORDER — HYDROCORTISONE ACETATE 25 MG RE SUPP: 25.0000 mg | Freq: Two times a day (BID) | RECTAL | 0 refills | Status: DC

## 2024-01-13 MED ORDER — HYDROCORTISONE 0.5 % EX CREA: 1.0000 | TOPICAL_CREAM | Freq: Two times a day (BID) | CUTANEOUS | 1 refills | Status: AC

## 2024-01-13 NOTE — Progress Notes (Signed)
 Established Patient Office Visit  Subjective    Patient ID: Aimee King, female    DOB: [birth year]  Age: 43 y.o. MRN: [id number]  CC:  Chief Complaint  Patient presents with   Medical Management of Chronic Issues    Pt is interesting in STI check  Pt reports has severe hemorrhoid that makes it hard for her to walk    HPI Aimee King presents with complaint of hemorrhoid. She reports it is very painful and she has had it for about 3 days. She also would like an STI check although she denies sx.   Outpatient Encounter Medications as of 01/13/2024  Medication Sig   albuterol  (VENTOLIN  HFA) 108 (90 Base) MCG/ACT inhaler Inhale into the lungs every 6 (six) hours as needed for wheezing or shortness of breath.   amphetamine -dextroamphetamine  (ADDERALL XR) 30 MG 24 hr capsule Take 1 capsule (30 mg total) by mouth every morning.   amphetamine -dextroamphetamine  (ADDERALL) 10 MG tablet Take 1 po q noon   budesonide -formoterol  (SYMBICORT ) 80-4.5 MCG/ACT inhaler Inhale 2 puffs into the lungs 2 (two) times daily.   buPROPion  (WELLBUTRIN  XL) 150 MG 24 hr tablet Take 3 tablets (450 mg total) by mouth daily.   buPROPion  (WELLBUTRIN  XL) 300 MG 24 hr tablet TAKE 1 TABLET BY MOUTH EVERY DAY   hydrocortisone cream 0.5 % Apply 1 Application topically 2 (two) times daily.   losartan  (COZAAR ) 25 MG tablet TAKE 1 TABLET (25 MG TOTAL) BY MOUTH DAILY.   megestrol  (MEGACE ) 40 MG tablet TAKE 2 TABLETS BY MOUTH TWICE A DAY   tirzepatide  (MOUNJARO ) 10 MG/0.5ML Pen Inject 10 mg into the skin once a week.   [DISCONTINUED] hydrocortisone (ANUSOL-HC) 25 MG suppository Place 1 suppository (25 mg total) rectally 2 (two) times daily.   [DISCONTINUED] fexofenadine  (ALLEGRA ) 180 MG tablet TAKE 1 TABLET BY MOUTH EVERY DAY   [DISCONTINUED] metFORMIN  (GLUCOPHAGE -XR) 750 MG 24 hr tablet TAKE 1 TABLET BY MOUTH EVERY DAY WITH BREAKFAST   No facility-administered encounter medications on file as of  01/13/2024.    Past Medical History:  Diagnosis Date   Acid reflux    ADD (attention deficit disorder)    Allergy    Anemia    Attention deficit disorder (ADD)    Depression    Endometriosis    IBS (irritable bowel syndrome)     Past Surgical History:  Procedure Laterality Date   CESAREAN SECTION  2004, 2008   CESAREAN SECTION     CHOLECYSTECTOMY  02/23/2002   CHOLECYSTECTOMY     LAPAROSCOPY  02/24/2000   endometriosis   TUBAL LIGATION  02/23/2006   TUBAL LIGATION     tubal ligation reversal Right     Family History  Problem Relation Age of Onset   Hypertension Mother    Diabetes Mother    Hypertension Father    Heart disease Father    Diabetes Father    Diabetes Maternal Grandmother    Kidney disease Maternal Grandfather    Kidney disease Maternal Uncle    Colon cancer Neg Hx    Esophageal cancer Neg Hx    Rectal cancer Neg Hx    Stomach cancer Neg Hx     Social History   Socioeconomic History   Marital status: Married    Spouse name: Not on file   Number of children: 3   Years of education: Not on file   Highest education level: Master's degree (e.g., MA, MS, MEng, MEd, MSW, MBA)  Occupational  History   Occupation: programmer, systems   Tobacco Use   Smoking status: Never   Smokeless tobacco: Never  Vaping Use   Vaping status: Never Used  Substance and Sexual Activity   Alcohol use: Yes    Alcohol/week: 0.0 standard drinks of alcohol    Comment: ocassionally   Drug use: No   Sexual activity: Yes    Partners: Male    Birth control/protection: None  Other Topics Concern   Not on file  Social History Narrative   ** Merged History Encounter **       Social Drivers of Health   Financial Resource Strain: Low Risk  (01/13/2024)   Overall Financial Resource Strain (CARDIA)    Difficulty of Paying Living Expenses: Not very hard  Recent Concern: Physicist, Medical Strain - Medium Risk (11/23/2023)   Overall Financial Resource Strain (CARDIA)    Difficulty  of Paying Living Expenses: Somewhat hard  Food Insecurity: No Food Insecurity (01/13/2024)   Hunger Vital Sign    Worried About Running Out of Food in the Last Year: Never true    Ran Out of Food in the Last Year: Never true  Transportation Needs: No Transportation Needs (01/13/2024)   PRAPARE - Administrator, Civil Service (Medical): No    Lack of Transportation (Non-Medical): No  Physical Activity: Inactive (01/13/2024)   Exercise Vital Sign    Days of Exercise per Week: 0 days    Minutes of Exercise per Session: Not on file  Stress: No Stress Concern Present (01/13/2024)   [employer] of Occupational Health - Occupational Stress Questionnaire    Feeling of Stress: Only a little  Recent Concern: Stress - Stress Concern Present (11/23/2023)   [employer] of Occupational Health - Occupational Stress Questionnaire    Feeling of Stress: Rather much  Social Connections: Socially Integrated (01/13/2024)   Social Connection and Isolation Panel    Frequency of Communication with Friends and Family: More than three times a week    Frequency of Social Gatherings with Friends and Family: Twice a week    Attends Religious Services: More than 4 times per year    Active Member of [employer] or Organizations: Yes    Attends Banker Meetings: 1 to 4 times per year    Marital Status: Married  Catering Manager Violence: Not At Risk (11/09/2022)   Humiliation, Afraid, Rape, and Kick questionnaire    Fear of Current or Ex-Partner: No    Emotionally Abused: No    Physically Abused: No    Sexually Abused: No    Review of Systems  All other systems reviewed and are negative.       Objective    BP 110/74   Pulse (!) 109   Ht 5' (1.524 m)   Wt 172 lb 9.6 oz (78.3 kg)   LMP 12/13/2023 (Approximate)   SpO2 97%   BMI 33.71 kg/m   Physical Exam Vitals and nursing note reviewed.  Constitutional:      General: She is not in acute distress. Cardiovascular:      Rate and Rhythm: Normal rate and regular rhythm.  Pulmonary:     Effort: Pulmonary effort is normal.     Breath sounds: Normal breath sounds.  Abdominal:     Palpations: Abdomen is soft.     Tenderness: There is no abdominal tenderness.  Genitourinary:    Rectum: External hemorrhoid present.  Neurological:     General: No focal deficit present.  Mental Status: She is alert and oriented to person, place, and time.         Assessment & Plan:   Screening for STDs (sexually transmitted diseases) -     Cervicovaginal ancillary only  Hemorrhoids, unspecified hemorrhoid type -     Ambulatory referral to Gastroenterology  Other orders -     Hydrocortisone ; Apply 1 Application topically 2 (two) times daily.  Dispense: 30 g; Refill: 1     No follow-ups on file.   Tanda Raguel SQUIBB, MD

## 2024-01-14 LAB — CERVICOVAGINAL ANCILLARY ONLY
Bacterial Vaginitis (gardnerella): NEGATIVE
Candida Glabrata: NEGATIVE
Candida Vaginitis: NEGATIVE
Chlamydia: NEGATIVE
Comment: NEGATIVE
Comment: NEGATIVE
Comment: NEGATIVE
Comment: NEGATIVE
Comment: NEGATIVE
Comment: NORMAL
Neisseria Gonorrhea: NEGATIVE
Trichomonas: NEGATIVE

## 2024-01-17 ENCOUNTER — Ambulatory Visit: Payer: Self-pay | Admitting: Family Medicine

## 2024-01-18 ENCOUNTER — Encounter: Payer: Self-pay | Admitting: Family Medicine

## 2024-01-25 ENCOUNTER — Other Ambulatory Visit: Payer: Self-pay

## 2024-01-25 ENCOUNTER — Encounter: Payer: Self-pay | Admitting: Family Medicine

## 2024-01-26 ENCOUNTER — Other Ambulatory Visit (HOSPITAL_COMMUNITY): Payer: Self-pay

## 2024-02-15 ENCOUNTER — Encounter: Payer: Self-pay | Admitting: Family Medicine

## 2024-02-15 ENCOUNTER — Other Ambulatory Visit: Payer: Self-pay | Admitting: Family Medicine

## 2024-02-15 ENCOUNTER — Other Ambulatory Visit: Payer: Self-pay

## 2024-02-15 ENCOUNTER — Telehealth: Payer: Self-pay

## 2024-02-15 MED ORDER — AMPHETAMINE-DEXTROAMPHET ER 30 MG PO CP24
30.0000 mg | ORAL_CAPSULE | ORAL | 0 refills | Status: DC
  Filled 2024-02-15: qty 30, 30d supply, fill #0

## 2024-02-15 MED ORDER — MOUNJARO 10 MG/0.5ML ~~LOC~~ SOAJ
10.0000 mg | SUBCUTANEOUS | 1 refills | Status: AC
  Filled 2024-02-15 – 2024-02-20 (×2): qty 6, 84d supply, fill #0

## 2024-02-15 MED ORDER — AMPHETAMINE-DEXTROAMPHETAMINE 10 MG PO TABS
10.0000 mg | ORAL_TABLET | Freq: Every day | ORAL | 0 refills | Status: DC
  Filled 2024-02-15: qty 30, 30d supply, fill #0

## 2024-02-15 NOTE — Telephone Encounter (Signed)
 Pharmacy Patient Advocate Encounter  Received notification from [facility] that Prior Authorization for AMPHETAMINE -DEXTROAMPHETAMINE  10MG  has been APPROVED from 02/15/2024 to 02/15/2027   PA #/Case ID/Reference #: [id number]

## 2024-02-20 ENCOUNTER — Other Ambulatory Visit: Payer: Self-pay

## 2024-02-21 ENCOUNTER — Other Ambulatory Visit: Payer: Self-pay

## 2024-03-18 ENCOUNTER — Other Ambulatory Visit: Payer: Self-pay | Admitting: Family Medicine

## 2024-03-20 NOTE — Telephone Encounter (Signed)
 Requested medication (s) are due for refill today - yes  Requested medication (s) are on the active medication list -yes  Future visit scheduled -no  Last refill: 02/15/24 #30- for both  Notes to clinic: non delegated Rx  Requested Prescriptions  Pending Prescriptions Disp Refills   amphetamine -dextroamphetamine  (ADDERALL XR) 30 MG 24 hr capsule 30 capsule 0    Sig: Take 1 capsule (30 mg total) by mouth every morning.     Not Delegated - Psychiatry:  Stimulants/ADHD Failed - 03/20/2024 12:07 PM      Failed - This refill cannot be delegated      Failed - Urine Drug Screen completed in last 360 days      Passed - Last BP in normal range    BP Readings from Last 1 Encounters:  01/13/24 110/74         Passed - Last Heart Rate in normal range    Pulse Readings from Last 1 Encounters:  01/13/24 (!) 109         Passed - Valid encounter within last 6 months    Recent Outpatient Visits           2 months ago Screening for STDs (sexually transmitted diseases)   [place] [facility], MD   3 months ago Type 2 diabetes mellitus without complication, without long-term current use of insulin (HCC)   [place] [facility], Raguel, MD   6 months ago Type 2 diabetes mellitus without complication, without long-term current use of insulin (HCC)   [place] [facility] Dept Of [place]. [facility] Fleeta Tonia Garnette LITTIE, RPH-CPP   7 months ago Attention deficit hyperactivity disorder (ADHD), combined type   [place] [facility] at [facility], MD   7 months ago Type 2 diabetes mellitus without complication, without long-term current use of insulin (HCC)   [place] [facility] Dept Of [place]. [facility] Fleeta Tonia, Garnette L, RPH-CPP               amphetamine -dextroamphetamine  (ADDERALL) 10 MG tablet 30 tablet 0    Sig: Take 1 tablet (10 mg total)  by mouth daily at noon.     Not Delegated - Psychiatry:  Stimulants/ADHD Failed - 03/20/2024 12:07 PM      Failed - This refill cannot be delegated      Failed - Urine Drug Screen completed in last 360 days      Passed - Last BP in normal range    BP Readings from Last 1 Encounters:  01/13/24 110/74         Passed - Last Heart Rate in normal range    Pulse Readings from Last 1 Encounters:  01/13/24 (!) 109         Passed - Valid encounter within last 6 months    Recent Outpatient Visits           2 months ago Screening for STDs (sexually transmitted diseases)   [place] [facility] at [facility], MD   3 months ago Type 2 diabetes mellitus without complication, without long-term current use of insulin (HCC)   [place] [facility] at [facility], Raguel, MD   6 months ago Type 2 diabetes mellitus without complication, without long-term current use of insulin (HCC)   [place] [facility] Dept Of  [place]. [facility] Fleeta Tonia Garnette LITTIE, RPH-CPP   7 months ago Attention deficit hyperactivity disorder (ADHD), combined type   [place] [facility] at [facility], MD   7 months ago Type 2 diabetes mellitus without complication, without long-term current use of insulin (HCC)   [place] [facility] Dept Of [place]. [facility] Fleeta Tonia Garnette LITTIE, RPH-CPP                 Requested Prescriptions  Pending Prescriptions Disp Refills   amphetamine -dextroamphetamine  (ADDERALL XR) 30 MG 24 hr capsule 30 capsule 0    Sig: Take 1 capsule (30 mg total) by mouth every morning.     Not Delegated - Psychiatry:  Stimulants/ADHD Failed - 03/20/2024 12:07 PM      Failed - This refill cannot be delegated      Failed - Urine Drug Screen completed in last 360 days      Passed - Last BP in normal range    BP Readings from Last 1 Encounters:  01/13/24 110/74         Passed -  Last Heart Rate in normal range    Pulse Readings from Last 1 Encounters:  01/13/24 (!) 109         Passed - Valid encounter within last 6 months    Recent Outpatient Visits           2 months ago Screening for STDs (sexually transmitted diseases)   [place] [facility], MD   3 months ago Type 2 diabetes mellitus without complication, without long-term current use of insulin (HCC)   [place] [facility] Raguel, MD   6 months ago Type 2 diabetes mellitus without complication, without long-term current use of insulin (HCC)   [place] [facility] - [facility] Dept Of [place]. [facility] Fleeta Tonia Garnette LITTIE, RPH-CPP   7 months ago Attention deficit hyperactivity disorder (ADHD), combined type   [place] [facility] at [facility], MD   7 months ago Type 2 diabetes mellitus without complication, without long-term current use of insulin (HCC)   [place] [facility] - [facility] Dept Of [place]. [facility] Fleeta Tonia, Garnette LITTIE, RPH-CPP               amphetamine -dextroamphetamine  (ADDERALL) 10 MG tablet 30 tablet 0    Sig: Take 1 tablet (10 mg total) by mouth daily at noon.     Not Delegated - Psychiatry:  Stimulants/ADHD Failed - 03/20/2024 12:07 PM      Failed - This refill cannot be delegated      Failed - Urine Drug Screen completed in last 360 days      Passed - Last BP in normal range    BP Readings from Last 1 Encounters:  01/13/24 110/74         Passed - Last Heart Rate in normal range    Pulse Readings from Last 1 Encounters:  01/13/24 (!) 109         Passed - Valid encounter within last 6 months    Recent Outpatient Visits           2 months ago Screening for STDs (sexually transmitted diseases)    [facility] at [facility], Rag[place], MD   3 months ago Type 2 diabetes mellitus without complication,  without long-term current  use of insulin [facility])   [place] [facility] at [facility] [place], MD   6 months ago Type 2 diabetes mellitus without complication, without long-term current use of insulin (HCC)   [place] [facility] [place]. [facility] Fleeta Tonia Garnette LITTIE, RPH-CPP   7 months ago Attention deficit hyperactivity disorder (ADHD), combined type   [place] [facility] at [facility], MD   7 months ago Type 2 diabetes mellitus without complication, without long-term current use of insulin (HCC)   [place] [facility] Dept Of [place]. [facility] Fleeta Tonia Garnette LITTIE, RPH-CPP

## 2024-03-21 ENCOUNTER — Other Ambulatory Visit: Payer: Self-pay

## 2024-03-21 MED ORDER — AMPHETAMINE-DEXTROAMPHETAMINE 10 MG PO TABS
10.0000 mg | ORAL_TABLET | Freq: Every day | ORAL | 0 refills | Status: AC
  Filled 2024-03-21: qty 30, 30d supply, fill #0

## 2024-03-21 MED ORDER — AMPHETAMINE-DEXTROAMPHET ER 30 MG PO CP24
30.0000 mg | ORAL_CAPSULE | ORAL | 0 refills | Status: AC
  Filled 2024-03-21: qty 30, 30d supply, fill #0

## 2024-03-22 ENCOUNTER — Other Ambulatory Visit: Payer: Self-pay
